# Patient Record
Sex: Female | Born: 1940 | Race: White | Hispanic: No | Marital: Married | Smoking: Former smoker
Health system: Southern US, Community
[De-identification: ages and names within clinical notes are randomized; demographics above are authoritative.]

## PROBLEM LIST (undated history)

## (undated) DIAGNOSIS — F419 Anxiety disorder, unspecified: Secondary | ICD-10-CM

## (undated) DIAGNOSIS — M2550 Pain in unspecified joint: Secondary | ICD-10-CM

## (undated) DIAGNOSIS — F329 Major depressive disorder, single episode, unspecified: Secondary | ICD-10-CM

## (undated) DIAGNOSIS — Z87442 Personal history of urinary calculi: Secondary | ICD-10-CM

## (undated) DIAGNOSIS — F32A Depression, unspecified: Secondary | ICD-10-CM

## (undated) DIAGNOSIS — G20 Parkinson's disease: Secondary | ICD-10-CM

## (undated) DIAGNOSIS — R0989 Other specified symptoms and signs involving the circulatory and respiratory systems: Secondary | ICD-10-CM

## (undated) DIAGNOSIS — F0390 Unspecified dementia without behavioral disturbance: Secondary | ICD-10-CM

## (undated) DIAGNOSIS — H269 Unspecified cataract: Secondary | ICD-10-CM

## (undated) DIAGNOSIS — Z86010 Personal history of colonic polyps: Secondary | ICD-10-CM

## (undated) DIAGNOSIS — M8580 Other specified disorders of bone density and structure, unspecified site: Secondary | ICD-10-CM

## (undated) DIAGNOSIS — G8929 Other chronic pain: Secondary | ICD-10-CM

## (undated) DIAGNOSIS — J189 Pneumonia, unspecified organism: Secondary | ICD-10-CM

## (undated) DIAGNOSIS — J339 Nasal polyp, unspecified: Secondary | ICD-10-CM

## (undated) DIAGNOSIS — E039 Hypothyroidism, unspecified: Secondary | ICD-10-CM

## (undated) DIAGNOSIS — G20A1 Parkinson's disease without dyskinesia, without mention of fluctuations: Secondary | ICD-10-CM

## (undated) DIAGNOSIS — R32 Unspecified urinary incontinence: Secondary | ICD-10-CM

## (undated) HISTORY — PX: ABDOMINAL HYSTERECTOMY: SHX81

## (undated) HISTORY — PX: TONSILLECTOMY: SUR1361

## (undated) HISTORY — DX: Other chronic pain: G89.29

## (undated) HISTORY — DX: Parkinson's disease: G20

## (undated) HISTORY — PX: CATARACT EXTRACTION: SUR2

## (undated) HISTORY — DX: Parkinson's disease without dyskinesia, without mention of fluctuations: G20.A1

## (undated) HISTORY — DX: Unspecified cataract: H26.9

## (undated) HISTORY — PX: APPENDECTOMY: SHX54

## (undated) HISTORY — DX: Pain in unspecified joint: M25.50

## (undated) HISTORY — DX: Hypothyroidism, unspecified: E03.9

## (undated) HISTORY — DX: Other specified disorders of bone density and structure, unspecified site: M85.80

## (undated) HISTORY — DX: Personal history of colonic polyps: Z86.010

## (undated) HISTORY — DX: Nasal polyp, unspecified: J33.9

---

## 2001-12-31 ENCOUNTER — Encounter: Admission: RE | Admit: 2001-12-31 | Discharge: 2001-12-31 | Payer: Self-pay | Admitting: Internal Medicine

## 2001-12-31 ENCOUNTER — Encounter: Payer: Self-pay | Admitting: Internal Medicine

## 2006-09-25 ENCOUNTER — Ambulatory Visit: Payer: Self-pay | Admitting: Family Medicine

## 2006-09-25 LAB — CONVERTED CEMR LAB
HCT: 37.5 % (ref 36.0–46.0)
Hemoglobin: 12.6 g/dL (ref 12.0–15.0)
Iron: 76 ug/dL (ref 42–145)
MCHC: 33.6 g/dL (ref 30.0–36.0)
MCV: 88.4 fL (ref 78.0–100.0)
Platelets: 265 10*3/uL (ref 150–400)
RBC: 4.24 M/uL (ref 3.87–5.11)
RDW: 12.1 % (ref 11.5–14.6)
TSH: 0.48 microintl units/mL (ref 0.35–5.50)
WBC: 6.4 10*3/uL (ref 4.5–10.5)

## 2007-04-16 ENCOUNTER — Encounter (INDEPENDENT_AMBULATORY_CARE_PROVIDER_SITE_OTHER): Payer: Self-pay | Admitting: Family Medicine

## 2007-06-20 ENCOUNTER — Telehealth (INDEPENDENT_AMBULATORY_CARE_PROVIDER_SITE_OTHER): Payer: Self-pay | Admitting: *Deleted

## 2007-08-01 ENCOUNTER — Telehealth (INDEPENDENT_AMBULATORY_CARE_PROVIDER_SITE_OTHER): Payer: Self-pay | Admitting: *Deleted

## 2008-09-06 ENCOUNTER — Encounter: Payer: Self-pay | Admitting: Family Medicine

## 2008-09-07 ENCOUNTER — Encounter (INDEPENDENT_AMBULATORY_CARE_PROVIDER_SITE_OTHER): Payer: Self-pay | Admitting: *Deleted

## 2008-09-07 LAB — CONVERTED CEMR LAB
ALT: 24 units/L
AST: 25 units/L
Albumin: 4 g/dL
Alkaline Phosphatase: 49 units/L
BUN: 23 mg/dL
CO2, serum: 28 mmol/L
Calcium: 9.6 mg/dL
Chloride, Serum: 105 mmol/L
Cholesterol: 195 mg/dL
Creatinine, Ser: 0.6 mg/dL
Glucose, Bld: 126 mg/dL
HDL: 40 mg/dL
LDL Cholesterol: 124.8 mg/dL
Potassium, serum: 3.8 mmol/L
Sodium, serum: 142 mmol/L
TSH: 0.69 microintl units/mL
Total Bilirubin: 0.2 mg/dL
Total Protein: 6.1 g/dL
Triglycerides: 153 mg/dL

## 2008-09-20 ENCOUNTER — Encounter: Payer: Self-pay | Admitting: Family Medicine

## 2008-10-11 ENCOUNTER — Encounter (INDEPENDENT_AMBULATORY_CARE_PROVIDER_SITE_OTHER): Payer: Self-pay | Admitting: *Deleted

## 2008-10-20 ENCOUNTER — Encounter: Payer: Self-pay | Admitting: Family Medicine

## 2008-10-20 ENCOUNTER — Ambulatory Visit: Payer: Self-pay | Admitting: Family Medicine

## 2008-10-20 DIAGNOSIS — M25519 Pain in unspecified shoulder: Secondary | ICD-10-CM | POA: Insufficient documentation

## 2008-10-20 DIAGNOSIS — H9319 Tinnitus, unspecified ear: Secondary | ICD-10-CM | POA: Insufficient documentation

## 2008-10-20 DIAGNOSIS — M2550 Pain in unspecified joint: Secondary | ICD-10-CM

## 2008-10-20 DIAGNOSIS — M79609 Pain in unspecified limb: Secondary | ICD-10-CM

## 2008-10-20 DIAGNOSIS — E039 Hypothyroidism, unspecified: Secondary | ICD-10-CM | POA: Insufficient documentation

## 2008-10-21 ENCOUNTER — Encounter (INDEPENDENT_AMBULATORY_CARE_PROVIDER_SITE_OTHER): Payer: Self-pay | Admitting: *Deleted

## 2008-10-25 ENCOUNTER — Encounter (INDEPENDENT_AMBULATORY_CARE_PROVIDER_SITE_OTHER): Payer: Self-pay | Admitting: *Deleted

## 2009-01-08 ENCOUNTER — Encounter: Payer: Self-pay | Admitting: Internal Medicine

## 2009-01-26 ENCOUNTER — Ambulatory Visit: Payer: Self-pay | Admitting: Family Medicine

## 2009-01-26 DIAGNOSIS — R209 Unspecified disturbances of skin sensation: Secondary | ICD-10-CM

## 2009-01-28 ENCOUNTER — Telehealth (INDEPENDENT_AMBULATORY_CARE_PROVIDER_SITE_OTHER): Payer: Self-pay | Admitting: *Deleted

## 2009-01-28 LAB — CONVERTED CEMR LAB
ALT: 54 units/L — ABNORMAL HIGH (ref 0–35)
AST: 34 units/L (ref 0–37)
Albumin: 4.2 g/dL (ref 3.5–5.2)
Alkaline Phosphatase: 51 units/L (ref 39–117)
BUN: 17 mg/dL (ref 6–23)
Basophils Absolute: 0 10*3/uL (ref 0.0–0.1)
Basophils Relative: 0.2 % (ref 0.0–3.0)
Bilirubin, Direct: 0 mg/dL (ref 0.0–0.3)
CO2: 29 meq/L (ref 19–32)
Calcium: 9.5 mg/dL (ref 8.4–10.5)
Chloride: 106 meq/L (ref 96–112)
Cholesterol: 190 mg/dL (ref 0–200)
Creatinine, Ser: 0.6 mg/dL (ref 0.4–1.2)
Eosinophils Absolute: 0.1 10*3/uL (ref 0.0–0.7)
Eosinophils Relative: 2.2 % (ref 0.0–5.0)
GFR calc non Af Amer: 105.52 mL/min (ref >60)
Glucose, Bld: 100 mg/dL — ABNORMAL HIGH (ref 70–99)
HCT: 38.7 % (ref 36.0–46.0)
HDL: 37.3 mg/dL — ABNORMAL LOW (ref >39.00)
Hemoglobin: 13.3 g/dL (ref 12.0–15.0)
LDL Cholesterol: 120 mg/dL — ABNORMAL HIGH (ref 0–99)
Lymphocytes Relative: 33 % (ref 12.0–46.0)
Lymphs Abs: 2 10*3/uL (ref 0.7–4.0)
MCHC: 34.5 g/dL (ref 30.0–36.0)
MCV: 87.6 fL (ref 78.0–100.0)
Monocytes Absolute: 0.5 10*3/uL (ref 0.1–1.0)
Monocytes Relative: 7.9 % (ref 3.0–12.0)
Neutro Abs: 3.5 10*3/uL (ref 1.4–7.7)
Neutrophils Relative %: 56.7 % (ref 43.0–77.0)
Platelets: 210 10*3/uL (ref 150.0–400.0)
Potassium: 3.9 meq/L (ref 3.5–5.1)
RBC: 4.42 M/uL (ref 3.87–5.11)
RDW: 12 % (ref 11.5–14.6)
Sodium: 140 meq/L (ref 135–145)
TSH: 2.03 microintl units/mL (ref 0.35–5.50)
Total Bilirubin: 0.6 mg/dL (ref 0.3–1.2)
Total CHOL/HDL Ratio: 5
Total Protein: 7.3 g/dL (ref 6.0–8.3)
Triglycerides: 164 mg/dL — ABNORMAL HIGH (ref 0.0–149.0)
VLDL: 32.8 mg/dL (ref 0.0–40.0)
WBC: 6.1 10*3/uL (ref 4.5–10.5)

## 2009-01-31 ENCOUNTER — Ambulatory Visit: Payer: Self-pay | Admitting: Family Medicine

## 2009-01-31 DIAGNOSIS — D485 Neoplasm of uncertain behavior of skin: Secondary | ICD-10-CM

## 2009-02-01 ENCOUNTER — Telehealth (INDEPENDENT_AMBULATORY_CARE_PROVIDER_SITE_OTHER): Payer: Self-pay | Admitting: *Deleted

## 2009-02-01 ENCOUNTER — Encounter: Payer: Self-pay | Admitting: Family Medicine

## 2009-02-09 ENCOUNTER — Ambulatory Visit: Payer: Self-pay | Admitting: Family Medicine

## 2009-02-09 DIAGNOSIS — K625 Hemorrhage of anus and rectum: Secondary | ICD-10-CM | POA: Insufficient documentation

## 2009-02-21 ENCOUNTER — Encounter (INDEPENDENT_AMBULATORY_CARE_PROVIDER_SITE_OTHER): Payer: Self-pay | Admitting: *Deleted

## 2009-02-21 ENCOUNTER — Ambulatory Visit: Payer: Self-pay | Admitting: Family Medicine

## 2009-02-21 LAB — CONVERTED CEMR LAB
OCCULT 1: NEGATIVE
OCCULT 2: NEGATIVE
OCCULT 3: NEGATIVE

## 2009-02-22 ENCOUNTER — Ambulatory Visit: Payer: Self-pay | Admitting: Family Medicine

## 2009-02-23 ENCOUNTER — Encounter (INDEPENDENT_AMBULATORY_CARE_PROVIDER_SITE_OTHER): Payer: Self-pay | Admitting: *Deleted

## 2009-02-23 LAB — CONVERTED CEMR LAB
ALT: 53 units/L — ABNORMAL HIGH (ref 0–35)
AST: 33 units/L (ref 0–37)
Albumin: 4.1 g/dL (ref 3.5–5.2)
Alkaline Phosphatase: 57 units/L (ref 39–117)
Bilirubin, Direct: 0.1 mg/dL (ref 0.0–0.3)
Total Bilirubin: 0.6 mg/dL (ref 0.3–1.2)
Total Protein: 7.1 g/dL (ref 6.0–8.3)

## 2009-05-11 ENCOUNTER — Telehealth (INDEPENDENT_AMBULATORY_CARE_PROVIDER_SITE_OTHER): Payer: Self-pay | Admitting: *Deleted

## 2009-05-25 ENCOUNTER — Ambulatory Visit: Payer: Self-pay | Admitting: Family Medicine

## 2009-05-25 DIAGNOSIS — J019 Acute sinusitis, unspecified: Secondary | ICD-10-CM | POA: Insufficient documentation

## 2009-06-23 ENCOUNTER — Ambulatory Visit: Payer: Self-pay | Admitting: Internal Medicine

## 2009-06-24 ENCOUNTER — Telehealth (INDEPENDENT_AMBULATORY_CARE_PROVIDER_SITE_OTHER): Payer: Self-pay | Admitting: *Deleted

## 2009-06-28 ENCOUNTER — Telehealth: Payer: Self-pay | Admitting: Family Medicine

## 2009-06-29 ENCOUNTER — Ambulatory Visit: Payer: Self-pay | Admitting: Internal Medicine

## 2009-07-11 ENCOUNTER — Ambulatory Visit: Payer: Self-pay | Admitting: Family Medicine

## 2009-08-09 ENCOUNTER — Telehealth (INDEPENDENT_AMBULATORY_CARE_PROVIDER_SITE_OTHER): Payer: Self-pay | Admitting: *Deleted

## 2009-09-12 ENCOUNTER — Telehealth (INDEPENDENT_AMBULATORY_CARE_PROVIDER_SITE_OTHER): Payer: Self-pay | Admitting: *Deleted

## 2009-10-12 ENCOUNTER — Telehealth (INDEPENDENT_AMBULATORY_CARE_PROVIDER_SITE_OTHER): Payer: Self-pay | Admitting: *Deleted

## 2009-12-12 ENCOUNTER — Telehealth (INDEPENDENT_AMBULATORY_CARE_PROVIDER_SITE_OTHER): Payer: Self-pay | Admitting: *Deleted

## 2010-02-13 ENCOUNTER — Telehealth (INDEPENDENT_AMBULATORY_CARE_PROVIDER_SITE_OTHER): Payer: Self-pay | Admitting: *Deleted

## 2010-03-07 ENCOUNTER — Ambulatory Visit: Payer: Self-pay | Admitting: Family Medicine

## 2010-03-07 DIAGNOSIS — H04129 Dry eye syndrome of unspecified lacrimal gland: Secondary | ICD-10-CM | POA: Insufficient documentation

## 2010-03-07 DIAGNOSIS — K117 Disturbances of salivary secretion: Secondary | ICD-10-CM | POA: Insufficient documentation

## 2010-03-08 ENCOUNTER — Encounter: Payer: Self-pay | Admitting: Family Medicine

## 2010-03-08 LAB — CONVERTED CEMR LAB
BUN: 12 mg/dL (ref 6–23)
Basophils Absolute: 0 10*3/uL (ref 0.0–0.1)
Basophils Relative: 0.6 % (ref 0.0–3.0)
CO2: 28 meq/L (ref 19–32)
Calcium: 9.9 mg/dL (ref 8.4–10.5)
Chloride: 105 meq/L (ref 96–112)
Creatinine, Ser: 0.7 mg/dL (ref 0.4–1.2)
Eosinophils Absolute: 0.1 10*3/uL (ref 0.0–0.7)
Eosinophils Relative: 1.7 % (ref 0.0–5.0)
GFR calc non Af Amer: 88.03 mL/min (ref >60)
Glucose, Bld: 96 mg/dL (ref 70–99)
HCT: 38.7 % (ref 36.0–46.0)
Hemoglobin: 13.4 g/dL (ref 12.0–15.0)
Lymphocytes Relative: 28.4 % (ref 12.0–46.0)
Lymphs Abs: 1.7 10*3/uL (ref 0.7–4.0)
MCHC: 34.7 g/dL (ref 30.0–36.0)
MCV: 85.9 fL (ref 78.0–100.0)
Monocytes Absolute: 0.5 10*3/uL (ref 0.1–1.0)
Monocytes Relative: 9.2 % (ref 3.0–12.0)
Neutro Abs: 3.6 10*3/uL (ref 1.4–7.7)
Neutrophils Relative %: 60.1 % (ref 43.0–77.0)
Platelets: 268 10*3/uL (ref 150.0–400.0)
Potassium: 4.1 meq/L (ref 3.5–5.1)
RBC: 4.5 M/uL (ref 3.87–5.11)
RDW: 13.8 % (ref 11.5–14.6)
Sed Rate: 31 mm/hr — ABNORMAL HIGH (ref 0–22)
Sodium: 139 meq/L (ref 135–145)
TSH: 1.99 microintl units/mL (ref 0.35–5.50)
WBC: 5.9 10*3/uL (ref 4.5–10.5)

## 2010-03-10 ENCOUNTER — Telehealth (INDEPENDENT_AMBULATORY_CARE_PROVIDER_SITE_OTHER): Payer: Self-pay | Admitting: *Deleted

## 2010-03-10 LAB — CONVERTED CEMR LAB: Anti Nuclear Antibody(ANA): NEGATIVE

## 2010-03-31 ENCOUNTER — Encounter: Payer: Self-pay | Admitting: Family Medicine

## 2010-04-04 ENCOUNTER — Encounter (INDEPENDENT_AMBULATORY_CARE_PROVIDER_SITE_OTHER): Payer: Self-pay | Admitting: *Deleted

## 2010-04-07 ENCOUNTER — Ambulatory Visit: Payer: Self-pay | Admitting: Family Medicine

## 2010-04-07 DIAGNOSIS — G20 Parkinson's disease: Secondary | ICD-10-CM

## 2010-04-07 DIAGNOSIS — Z780 Asymptomatic menopausal state: Secondary | ICD-10-CM | POA: Insufficient documentation

## 2010-04-19 ENCOUNTER — Telehealth: Payer: Self-pay | Admitting: Family Medicine

## 2010-04-27 ENCOUNTER — Ambulatory Visit: Payer: Self-pay | Admitting: Family Medicine

## 2010-04-27 LAB — CONVERTED CEMR LAB
OCCULT 1: NEGATIVE
OCCULT 2: NEGATIVE
OCCULT 3: NEGATIVE

## 2010-04-28 ENCOUNTER — Encounter (INDEPENDENT_AMBULATORY_CARE_PROVIDER_SITE_OTHER): Payer: Self-pay | Admitting: *Deleted

## 2010-04-28 ENCOUNTER — Encounter: Payer: Self-pay | Admitting: Family Medicine

## 2010-05-05 ENCOUNTER — Ambulatory Visit: Payer: Self-pay | Admitting: Family Medicine

## 2010-05-09 ENCOUNTER — Telehealth (INDEPENDENT_AMBULATORY_CARE_PROVIDER_SITE_OTHER): Payer: Self-pay | Admitting: *Deleted

## 2010-05-09 LAB — CONVERTED CEMR LAB
ALT: 24 units/L (ref 0–35)
Bilirubin, Direct: 0.1 mg/dL (ref 0.0–0.3)
Cholesterol: 177 mg/dL (ref 0–200)
Direct LDL: 110.4 mg/dL
HDL: 40.8 mg/dL (ref >39.00)
Total Bilirubin: 0.5 mg/dL (ref 0.3–1.2)
Total CHOL/HDL Ratio: 4
Triglycerides: 250 mg/dL — ABNORMAL HIGH (ref 0.0–149.0)

## 2010-05-16 ENCOUNTER — Telehealth (INDEPENDENT_AMBULATORY_CARE_PROVIDER_SITE_OTHER): Payer: Self-pay | Admitting: *Deleted

## 2010-05-17 ENCOUNTER — Telehealth (INDEPENDENT_AMBULATORY_CARE_PROVIDER_SITE_OTHER): Payer: Self-pay | Admitting: *Deleted

## 2010-05-31 ENCOUNTER — Encounter (INDEPENDENT_AMBULATORY_CARE_PROVIDER_SITE_OTHER): Payer: Self-pay | Admitting: *Deleted

## 2010-05-31 DIAGNOSIS — M949 Disorder of cartilage, unspecified: Secondary | ICD-10-CM

## 2010-05-31 DIAGNOSIS — M899 Disorder of bone, unspecified: Secondary | ICD-10-CM | POA: Insufficient documentation

## 2010-06-02 ENCOUNTER — Ambulatory Visit: Payer: Self-pay | Admitting: Family Medicine

## 2010-07-03 ENCOUNTER — Telehealth (INDEPENDENT_AMBULATORY_CARE_PROVIDER_SITE_OTHER): Payer: Self-pay | Admitting: *Deleted

## 2010-07-05 ENCOUNTER — Telehealth (INDEPENDENT_AMBULATORY_CARE_PROVIDER_SITE_OTHER): Payer: Self-pay | Admitting: *Deleted

## 2010-07-26 ENCOUNTER — Telehealth (INDEPENDENT_AMBULATORY_CARE_PROVIDER_SITE_OTHER): Payer: Self-pay | Admitting: *Deleted

## 2010-11-14 ENCOUNTER — Telehealth (INDEPENDENT_AMBULATORY_CARE_PROVIDER_SITE_OTHER): Payer: Self-pay | Admitting: *Deleted

## 2010-12-12 NOTE — Progress Notes (Signed)
Summary: bone density results  Phone Note Outgoing Call   Call placed by: Doristine Devoid,  May 17, 2010 2:18 PM Call placed to: Patient Summary of Call: spoke w/ patient aware of bone density results informed she needs to be started on fosamax 35mg  weekly and to be sure not to take medication at same time as synthroid patient ok'd instruction    New/Updated Medications: FOSAMAX 35 MG TABS (ALENDRONATE SODIUM) take one tablet weekly Prescriptions: FOSAMAX 35 MG TABS (ALENDRONATE SODIUM) take one tablet weekly  #4 x 12   Entered by:   Doristine Devoid   Authorized by:   Neena Rhymes MD   Signed by:   Doristine Devoid on 05/17/2010   Method used:   Electronically to        [facility] [address] #[id number]* (retail)       [address]       [place], [place]  [address]       Ph: [phone]       Fax: [phone]   RxID:   [id number]    Preventive Care Screening  T-score L femur:    Date:  04/28/2010    Results:  -1.3   T-score L hip:    Date:  04/28/2010    Results:  1.1 SDs  T-score L-Spine:    Date:  04/28/2010    Results:  1.123   Bone Density:    Date:  04/28/2010    Results:  osteopenia std dev

## 2010-12-12 NOTE — Progress Notes (Signed)
Summary: Spot on face  Phone Note Call from Patient Call back at Home Phone [phone]   Caller: Patient Reason for Call: Talk to Nurse, Talk to Doctor Summary of Call: Patient called and left a message on the triage line stating that she had a freckle/brown spot on her face. It had gotten a "crust" on it with black marks on it. She said the crust has fallen off but the mark feels thicker or raised now. She wants to know if she should be seen by Dr. Beverely Low or referred to a dermatologist. Her friend see Amy Swaziland at [facility]. Please advise.  Initial call taken by: Harold Barban,  May 16, 2010 10:36 AM  Follow-up for Phone Call        ok for derm- Dr Swaziland may be a long wait.  up to pt how long she wants to wait Follow-up by: Neena Rhymes MD,  May 16, 2010 10:43 AM  Additional Follow-up for Phone Call Additional follow up Details #1::        Patient has an appt on with Dr. Swaziland on 10.19.11 @ 9:30. Patient is aware of appt information.  Additional Follow-up by: Harold Barban,  May 16, 2010 2:54 PM

## 2010-12-12 NOTE — Progress Notes (Signed)
Summary: refill  Phone Note Refill Request Message from:  Fax from Pharmacy on July 26, 2010 11:46 AM  Refills Requested: Medication #1:  FENOFIBRATE 160 MG TABS take one tablet daily [facility] [address] - fax [phone]  Initial call taken by: Okey Regal Spring,  July 26, 2010 11:46 AM  Follow-up for Phone Call        spoke with pharmacy can disregard request rx on file from 07-05-10 ............Marland KitchenFelecia Deloach CMA  July 26, 2010 11:51 AM

## 2010-12-12 NOTE — Progress Notes (Signed)
Summary: celebrex refill   Phone Note Refill Request Message from:  Fax from Pharmacy on [facility] [address] fax [phone]  Refills Requested: Medication #1:  CELEBREX 200 MG  CAPS Take one tablet twice daily as needed Initial call taken by: Barb Merino,  December 12, 2009 10:00 AM    Prescriptions: CELEBREX 200 MG  CAPS (CELECOXIB) Take one tablet twice daily as needed  #60 x 2   Entered by:   Doristine Devoid   Authorized by:   Neena Rhymes MD   Signed by:   Doristine Devoid on 12/12/2009   Method used:   Electronically to        [facility] #[id number]* (retail)       [address]       [place], [place]  [address]       Ph: [phone]       Fax: [phone]   RxID:   [id number]

## 2010-12-12 NOTE — Progress Notes (Signed)
Summary: levothyroxine refill   Phone Note Refill Request Message from:  Fax from Pharmacy on February 13, 2010 9:16 AM  Refills Requested: Medication #1:  LEVOTHYROXINE SODIUM 112 MCG  TABS take 1 daily [facility] [address] Valinda Hoar [phone]   Method Requested: Fax to Local Pharmacy Next Appointment Scheduled: NO APPT Initial call taken by: Barb Merino,  February 13, 2010 9:17 AM    Prescriptions: LEVOTHYROXINE SODIUM 112 MCG  TABS (LEVOTHYROXINE SODIUM) take 1 daily  #30 x 0   Entered by:   Doristine Devoid   Authorized by:   Neena Rhymes MD   Signed by:   Doristine Devoid on 02/13/2010   Method used:   Electronically to        [facility] [address] #[id number]* (retail)       [address]       [place], [place]  [address]       Ph: [phone]       Fax: [phone]   RxID:   [id number]

## 2010-12-12 NOTE — Progress Notes (Signed)
Summary: levothyroxine refill   Phone Note Refill Request Message from:  Fax from Pharmacy on July 03, 2010 8:13 AM  Refills Requested: Medication #1:  LEVOTHYROXINE SODIUM 112 MCG  TABS take 1 daily [facility] [address] - fax [phone]  Initial call taken by: Okey Regal Spring,  July 03, 2010 8:23 AM    Prescriptions: LEVOTHYROXINE SODIUM 112 MCG  TABS (LEVOTHYROXINE SODIUM) take 1 daily  #30 x 3   Entered by:   Doristine Devoid CMA   Authorized by:   Neena Rhymes MD   Signed by:   Doristine Devoid CMA on 07/03/2010   Method used:   Electronically to        [facility] #[id number]* (retail)       [address]       [place], [place]  [address]       Ph: [phone]       Fax: [phone]   RxID:   [id number]

## 2010-12-12 NOTE — Miscellaneous (Signed)
  Clinical Lists Changes  Observations: Added new observation of MAMMOGRAM: normal (03/31/2010 13:54)      Preventive Care Screening  Mammogram:    Date:  03/31/2010    Results:  normal

## 2010-12-12 NOTE — Assessment & Plan Note (Signed)
Summary: cpx/cbs   Vital Signs:  Patient profile:   70 year old female Height:      63.50 inches Weight:      150 pounds Pulse rate:   66 / minute BP sitting:   114 / 70  (left arm)  Vitals Entered By: Doristine Devoid (Apr 07, 2010 10:00 AM) CC: CPX    History of Present Illness: 70 yo woman here today for CPE.    Here for Medicare AWV:  1.   Risk factors based on Past M, S, F history: - age: pt due for colonoscopy but not willing to have one.  needs bone density. - mildly elevated cholesterol and triglycerides: due for labs, not fasting today - tremor: R hand, new for pt.  thinks it's 'funny' 2.   Physical Activities: golf, gardening, walking 3.   Depression/mood: none present- good social support 4.   Hearing: only deficits are when pt has sinus congestion 5.   ADL's: independent 6.   Fall Risk: not at risk 7.   Home Safety: safe at home, no concerns 8.   Height, weight, &visual acuity: see vitals 9.   Counseling: anticipatory guidance provided. 10.   Labs ordered based on risk factors: pt to return for fasting labs 11.           Referral Coordination: had mammogram 5/20, bone density was ordered but not done.  not willing to have colonoscopy. 12.           Care Plan- proceed w/ DEXA, check labs. 13.           Cognitive Assessment- AAOx3, W-O-R-L-D, 3/3 recall  Preventive Screening-Counseling & Management  Alcohol-Tobacco     Alcohol drinks/day: 1     Smoking Status: quit  Caffeine-Diet-Exercise     Does Patient Exercise: yes      Sexual History:  currently monogamous.        Drug Use:  never.    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 Codeine 2)  ! * Ivp Dye 3)  ! Neurontin  Past History:  Past Medical History: Last updated: 10/20/2008 Chronic joint pain Hypothyroidism  Family History: Last updated: 10/20/2008 CAD-no HTN-no DM-no STROKE-no COLON CA-no BREAST CA-no  Social History: Last updated: 05/25/2009 Married originally from [place]  Past Surgical History: Hysterectomy- ovaries remain Tonsillectomy Appendectomy  Social History: Smoking Status:  quit Does Patient Exercise:  yes Sexual History:  currently monogamous Drug Use:  never  Review of Systems  The patient denies anorexia, fever, weight loss, weight gain, vision loss, decreased hearing, hoarseness, chest pain, syncope, dyspnea on exertion, peripheral edema, prolonged cough, headaches, abdominal pain, melena, hematochezia, severe indigestion/heartburn, hematuria, suspicious skin lesions, depression, abnormal bleeding, enlarged lymph nodes, and breast masses.    Physical Exam  General:  in no acute distress; alert,appropriate and cooperative throughout examination Head:  Normocephalic and atraumatic without obvious abnormalities. No apparent alopecia or balding.   Eyes:  No corneal or conjunctival inflammation noted. EOMI. Perrla. Vision grossly normal. Ears:  External ear exam shows no significant lesions or deformities.  Otoscopic examination reveals clear canals, tympanic membranes are intact bilaterally without bulging, retraction, inflammation or discharge. Hearing is grossly normal bilaterally. Nose:  External nasal examination shows no deformity or inflammation. Nasal mucosa are pink and moist without lesions or exudates. Mouth:  Oral mucosa and oropharynx  without lesions or exudates.  Teeth in good repair. Neck:  No deformities, masses, or tenderness noted. Breasts:  No mass, nodules, thickening, tenderness, bulging, retraction, inflamation, nipple discharge or skin changes noted.   Lungs:  Normal respiratory effort, chest expands symmetrically. Lungs are clear to auscultation, no crackles or wheezes. Heart:  Normal rate and regular rhythm. S1 and S2 normal without gallop, murmur, click, rub or other extra sounds. Abdomen:  Bowel sounds positive,abdomen soft and non-tender without masses, organomegaly or hernias noted. Genitalia:  normal introitus, no external lesions, no vaginal discharge, and mucosa pink and moist.  normal adnexa on bimanual exam Msk:  No deformity or scoliosis noted of thoracic or lumbar spine.   Pulses:  +2 carotid, radial, DP Extremities:  No clubbing, cyanosis, edema, or deformity noted with normal full range of motion of all joints.   Neurologic:  mild tremor of R hand.  no cogwheeling or rigidity remainder of neuro exam WNL Skin:  Intact without suspicious lesions or rashes Cervical Nodes:  No lymphadenopathy noted Axillary Nodes:  No palpable lymphadenopathy Psych:  Cognition and judgment appear intact. Alert and cooperative with normal attention span and concentration. No apparent delusions, illusions, hallucinations   Impression & Recommendations:  Problem # 1:  PHYSICAL EXAMINATION (ICD-V70.0)  PE WNL.  pt unwilling to have colonoscopy, will do stool cards as a compromise.  UTD on mammogram.  needs baseline EKG in order to have one on file for future comparison.  Orders: EKG w/ Interpretation (93000) First annual wellness visit with prevention plan  (Z6109)  Problem # 2:  POSTMENOPAUSAL STATUS (ICD-V49.81) Assessment: New due for bone density.  needs to schedule at her convenience.  will also check Vit D  Problem # 3:  DRY EYE SYNDROME (ICD-375.15) Assessment: Unchanged somewhat improved w/ Pataday drops.   encouraged to schedule routine eye exam and discuss this w/ her eye doctor.  Problem # 4:  TREMOR, RIGHT HAND (ICD-781.0) Assessment: New pt unable to report whether this occurs only at rest or also w/ activity.  'i don't pay much attention to it'.  thinks that it occurs when her arm is resting on something.  no rigidity or cogwheeling on exam.  asked pt to monitor sxs and f/u if worsening or changing.  Complete Medication List: 1)  Levothyroxine Sodium 112 Mcg Tabs (Levothyroxine sodium) .... Take 1 daily 2)  Celebrex 200 Mg Caps (Celecoxib) .... Take one tablet twice daily as needed 3)  Tylenol Extra Strength 500 Mg Tabs (Acetaminophen) .... Take 2-4 tablet daily 4)  Voltaren 1 % Gel (Diclofenac sodium) .... Apply 2 grams qid as needed 5)  Claritin 10 Mg Tabs (Loratadine) .... Prn 6)  Pataday 0.2 % Soln (Olopatadine hcl) .Marland Kitchen.. 1 drop in each eye daily  Patient Instructions: 1)  Please schedule a fasting lab appt at your convenience 2)  Hepatic Panel prior to visit ICD-9: V70 3)  Lipid panel prior to visit ICD-9 : V70 4)  Vitamin D prior to visit  ICD-9: V49.81 5)  Please call and schedule your bone density at your convenience 6)  Get an eye exam at your convenience- they will also be able to help with your dry eye 7)  If your tremor is worsening, you notice it in other areas, or have other concerns- please call 8)  Complete your stool cards and return them 9)  Call with any questions or concerns

## 2010-12-12 NOTE — Progress Notes (Signed)
Summary: labs  Phone Note Outgoing Call   Call placed by: Doristine Devoid,  May 09, 2010 10:08 AM Call placed to: Patient Summary of Call: triglycerides are almost 100 points higher than last check.  needs to start fenofibrate 160mg  daily and recheck LFTs in 6-8 weeks.  should start 50,000 units weekly x8 weeks and then recheck  Follow-up for Phone Call        spoke w/ patient aware of labs and that medication needs to be started........Marland KitchenDoristine Devoid  May 09, 2010 10:17 AM     New/Updated Medications: FENOFIBRATE 160 MG TABS (FENOFIBRATE) take one tablet daily VITAMIN D (ERGOCALCIFEROL) 50000 UNIT CAPS (ERGOCALCIFEROL) take one tablet weekly x8 weeks Prescriptions: VITAMIN D (ERGOCALCIFEROL) 50000 UNIT CAPS (ERGOCALCIFEROL) take one tablet weekly x8 weeks  #8 x 0   Entered by:   Doristine Devoid   Authorized by:   Neena Rhymes MD   Signed by:   Doristine Devoid on 05/09/2010   Method used:   Electronically to        [facility] #[id number]* (retail)       [address]       [place], [place]  [address]       Ph: [phone]       Fax: [phone]   RxID:   [id number] FENOFIBRATE 160 MG TABS (FENOFIBRATE) take one tablet daily  #30 x 3   Entered by:   Doristine Devoid   Authorized by:   Neena Rhymes MD   Signed by:   Doristine Devoid on 05/09/2010   Method used:   Electronically to        [facility] #[facility]* (retail)       [address]       [place], [place]  [address]       Ph: [phone]       Fax: [phone]   RxID:   [id number]

## 2010-12-12 NOTE — Miscellaneous (Signed)
  Clinical Lists Changes  Problems: Added new problem of OSTEOPENIA (ICD-733.90) 

## 2010-12-12 NOTE — Assessment & Plan Note (Signed)
Summary: TO DISCUSS HER BONE DESTINY ANOTHER TEST--PH   Vital Signs:  Patient profile:   70 year old female Height:      63.50 inches (161.29 cm) Weight:      151.25 pounds (68.75 kg) BMI:     26.47 Temp:     98.6 degrees F (37.00 degrees C) oral BP sitting:   120 / 60  (right arm)  Vitals Entered By: Lucious Groves CMA (June 02, 2010 10:32 AM) CC: Est pt--discuss bone density and medication concern with Fosamax./kb Comments Patient also notes that her sinus issues returned last night and she would like to discuss that also./kb   History of Present Illness: 70 yo woman here today for  1) Bone density- has questions about Fosamax, 'i'm afraid of it'.  fears side effects listed in insert- jaw pain, GERD, femur fx, etc.  brought copy of DEXA to review.  pt w/ osteopenia  2) Sinus pressure- sxs started yesterday.  had bloody nasal drainage, facial pressure/pain, + tooth pain.  + HA, temp last night to 100.  still feeling poorly today.  hx of sinus infxns.  3) Tremor- pt reports it is unchanged, she 'laughs about it'.  husband now more aware of it.  will consider w/u after she returns from an upcoming vacation.  Current Medications (verified): 1)  Levothyroxine Sodium 112 Mcg  Tabs (Levothyroxine Sodium) .... Take 1 Daily 2)  Celebrex 200 Mg  Caps (Celecoxib) .... Take One Tablet Twice Daily As Needed 3)  Tylenol Extra Strength 500 Mg Tabs (Acetaminophen) .... Take 2-4 Tablet Daily 4)  Voltaren 1 % Gel (Diclofenac Sodium) .... Apply 2 Grams Qid As Needed 5)  Claritin 10 Mg Tabs (Loratadine) .... Prn 6)  Fenofibrate 160 Mg Tabs (Fenofibrate) .... Take One Tablet Daily 7)  Vitamin D (Ergocalciferol) 50000 Unit Caps (Ergocalciferol) .... Take One Tablet Weekly X8 Weeks 8)  Amoxicillin 500 Mg Tabs (Amoxicillin) .... 2 Tabs By Mouth Two Times A Day X10 Days 9)  Diflucan 150 Mg Tabs (Fluconazole) .... Once Daily.  May Repeat in 3 Days If Sxs Persist  Allergies (verified): 1)  ! Codeine 2)  !  * Ivp Dye 3)  ! Neurontin  Past History:  Past Medical History: Chronic joint pain Hypothyroidism osteopenia  Review of Systems      See HPI  Physical Exam  General:  in no acute distress; alert,appropriate and cooperative throughout examination Head:  Normocephalic and atraumatic without obvious abnormalities. No apparent alopecia or balding.  + TTP over maxillary sinuses Eyes:  no injxn or inflammation Ears:  External ear exam shows no significant lesions or deformities.  Otoscopic examination reveals clear canals, tympanic membranes are intact bilaterally without bulging, retraction, inflammation or discharge. Hearing is grossly normal bilaterally. Nose:  External nasal examination shows no deformity or inflammation. Nasal mucosa are pink and moist without lesions or exudates. Mouth:  Oral mucosa and oropharynx without lesions or exudates.  Teeth in good repair. Lungs:  Normal respiratory effort, chest expands symmetrically. Lungs are clear to auscultation, no crackles or wheezes. Heart:  Normal rate and regular rhythm. S1 and S2 normal without gallop, murmur, click, rub or other extra sounds. Neurologic:  + pill rolling tremor of R hand   Impression & Recommendations:  Problem # 1:  OSTEOPENIA (ICD-733.90) Assessment Unchanged given pt's concerns about the side effects of meds will stop medication.  encouraged increased Ca + Vit D along w/ weight bearing exercise.  will need to follow bone density  closely.  Pt expresses understanding and is in agreement w/ this plan. The following medications were removed from the medication list:    Fosamax 35 Mg Tabs (Alendronate sodium) .Marland Kitchen... Take one tablet weekly Her updated medication list for this problem includes:    Vitamin D (ergocalciferol) 50000 Unit Caps (Ergocalciferol) .Marland Kitchen... Take one tablet weekly x8 weeks  Problem # 2:  SINUSITIS - ACUTE-NOS (ICD-461.9) Assessment: Unchanged  pt w/ recurrent sinusitis.  start amox.  Pt  expresses understanding and is in agreement w/ this plan. Her updated medication list for this problem includes:    Amoxicillin 500 Mg Tabs (Amoxicillin) .Marland Kitchen... 2 tabs by mouth two times a day x10 days  Orders: Prescription Created Electronically [phone])  Problem # 3:  TREMOR, RIGHT HAND (ICD-781.0) Assessment: Unchanged pt's tremor now appears more pill rolling in nature.  again offered neuro c/s- pt declined.  will follow.  Complete Medication List: 1)  Levothyroxine Sodium 112 Mcg Tabs (Levothyroxine sodium) .... Take 1 daily 2)  Celebrex 200 Mg Caps (Celecoxib) .... Take one tablet twice daily as needed 3)  Tylenol Extra Strength 500 Mg Tabs (Acetaminophen) .... Take 2-4 tablet daily 4)  Voltaren 1 % Gel (Diclofenac sodium) .... Apply 2 grams qid as needed 5)  Claritin 10 Mg Tabs (Loratadine) .... Prn 6)  Fenofibrate 160 Mg Tabs (Fenofibrate) .... Take one tablet daily 7)  Vitamin D (ergocalciferol) 50000 Unit Caps (Ergocalciferol) .... Take one tablet weekly x8 weeks 8)  Amoxicillin 500 Mg Tabs (Amoxicillin) .... 2 tabs by mouth two times a day x10 days 9)  Diflucan 150 Mg Tabs (Fluconazole) .... Once daily.  may repeat in 3 days if sxs persist  Patient Instructions: 1)  Follow up in November (6 months from your physical) unless you need me sooner 2)  Take the Amoxicillin as directed for your sinus infxn 3)  Use the Diflucan as needed 4)  STOP the Fosamax 5)  Start the Caltrate + D (2 daily) once you finish you weekly Vit D 6)  If you decide you want to see a neurologist for your tremor- call when you get back 7)  Have an AMAZING trip! Prescriptions: DIFLUCAN 150 MG TABS (FLUCONAZOLE) once daily.  may repeat in 3 days if sxs persist  #2 x 0   Entered and Authorized by:   Neena Rhymes MD   Signed by:   Neena Rhymes MD on 06/02/2010   Method used:   Electronically to        [facility] [phone]* (retail)       [address]       [place], [place]  [address]       Ph: [phone]       Fax: [phone]   RxID:   [id number] AMOXICILLIN 500 MG TABS (AMOXICILLIN) 2 tabs by mouth two times a day x10 days  #40 x 0   Entered and Authorized by:   Neena Rhymes MD   Signed by:   Neena Rhymes MD on 06/02/2010   Method used:   Electronically to        [facility] #[address]* (retail)       [address]       [place], [place]  [address]       Ph: [phone]       Fax: [phone]   RxID:  [id number]  

## 2010-12-12 NOTE — Progress Notes (Signed)
Summary: dry mouth  Phone Note Call from Patient Call back at Home Phone [phone]   Caller: Patient Summary of Call: informed pt of labwork =normal Pt wants to know why does she still have such a dry mouth? Mouth so dry cannot lick a envelope. --Pataday drops is helping with my eyes .Kandice Hams  March 10, 2010 4:27 PM  Initial call taken by: Kandice Hams,  March 10, 2010 4:27 PM  Follow-up for Phone Call        based on pt's med list she isn't on anything that would cause dry mouth.  if she is on herbal meds or supplements they may be causing symptoms.  pt may want to discuss this with her dentist- they may have additional thoughts. Follow-up by: Neena Rhymes MD,  March 10, 2010 4:36 PM  Additional Follow-up for Phone Call Additional follow up Details #1::        Spoke with pt  given Dr Beverely Low recommendations, pt agred will check with dentist .Kandice Hams  March 10, 2010 4:47 PM  Additional Follow-up by: Kandice Hams,  March 10, 2010 4:47 PM

## 2010-12-12 NOTE — Progress Notes (Signed)
Summary: FENOFIBRATE REFILL   Phone Note Refill Request Call back at [phone] Message from:  Pharmacy on July 05, 2010 9:50 AM  Refills Requested: Medication #1:  FENOFIBRATE 160 MG TABS take one tablet daily   Dosage confirmed as above?Dosage Confirmed   Supply Requested: 3 months   Notes: THE PATIENT IS REQUESTING AUTHORIZATION TO DISPENSE A 90 DAY SUPPLY. [facility] [address]   Next Appointment Scheduled: NONE Initial call taken by: Lavell Islam,  July 05, 2010 9:50 AM    Prescriptions: FENOFIBRATE 160 MG TABS (FENOFIBRATE) take one tablet daily  #90 x 1   Entered by:   Doristine Devoid CMA   Authorized by:   Neena Rhymes MD   Signed by:   Doristine Devoid CMA on December 28, 202011   Method used:   Electronically to        [facility] [address] #[facility]* (retail)       [address]       [place], [place]  [address]       Ph: [phone]       Fax: [phone]   RxID:   [id number]

## 2010-12-12 NOTE — Progress Notes (Signed)
Summary: FYI CELEBREX AND STOOL CARD  Phone Note Call from Patient Call back at [facility] Phone [phone]   Summary of Call: pt called in ref to her stool card kit, she is ready to do it, and was reading to avoid anti-inflammatory meds for 7 days.  Pt is on Celebrex and cannot go without it. she will hurt she remembers before when doing kit. Dr Beverely Low told me not to worry about it. Informed pt if was told before, ok to continue Celebrex.  Initial call taken by: Kandice Hams,  April 19, 2010 2:06 PM  Follow-up for Phone Call        pt can complete cards and continue her Celebrex. Follow-up by: Neena Rhymes MD,  April 19, 2010 2:09 PM

## 2010-12-12 NOTE — Letter (Signed)
Summary: Results Follow up Letter  [facility]  [address] [place], [place] [address]   Phone: [phone]  Fax: [phone]    04/28/2010 MRN: [id number]  Melissa Douglas [address] [place], [place]  [address]  Dear [name],  The following are the results of your recent test(s):  Test         Result    Pap Smear:        Normal _____  Not Normal _____ Comments: ______________________________________________________ Cholesterol: LDL(Bad cholesterol):         Your goal is less than:         HDL (Good cholesterol):       Your goal is more than: Comments:  ______________________________________________________ Mammogram:        Normal _____  Not Normal _____ Comments:  ___________________________________________________________________ Hemoccult:        Normal __X___  Not normal _______ Comments:    _____________________________________________________________________ Other Tests:    We routinely do not discuss normal results over the telephone.  If you desire a copy of the results, or you have any questions about this information we can discuss them at your next office visit.   Sincerely,

## 2010-12-12 NOTE — Assessment & Plan Note (Signed)
Summary: dry mouth & eyes/cbs   Vital Signs:  Patient profile:   70 year old female Height:      63.50 inches Weight:      152 pounds BMI:     26.60 Pulse rate:   64 / minute BP sitting:   120 / 60  Vitals Entered By: Kandice Hams (March 07, 2010 9:29 AM) CC: C/O DRY MOUTH, EYES DRY, PUFFY   History of Present Illness: 70 yo woman here today c/o dry mouth and eyes.  first noticed eye sxs 3 weeks ago- having problems w/ contacts.  unable to tolerate wearing contacts at this point.  reports eyelids now swollen and 'has a goo' that comes down over her eyes, especially in the AMs.  also reports having dry mouth- inability to lick an envelope.  has been trying to stay hydrated w/ water, club soda.  also notes vaginal dryness.  needing to drink liquids in order to swallow foods.  pt reports sxs were very rapid.  possible subjective fevers.  notes 'white gunk' on back of tongue.  is using Claritin but no decongestants.  no autoimmune diseases in the family.  Allergies (verified): 1)  ! Codeine 2)  ! * Ivp Dye 3)  ! Neurontin  Past History:  Past Medical History: Last updated: 10/20/2008 Chronic joint pain Hypothyroidism  Review of Systems      See HPI  Physical Exam  General:  in no acute distress; alert,appropriate and cooperative throughout examination Head:  Normocephalic and atraumatic without obvious abnormalities. No apparent alopecia or balding.   Eyes:  No corneal or conjunctival inflammation noted. EOMI. Perrla.Vision grossly normal.  superior lids minimally puffy Mouth:  + PND, tacky mucous membranes Neck:  No deformities, masses, or tenderness noted. Lungs:  Normal respiratory effort, chest expands symmetrically. Lungs are clear to auscultation, no crackles or wheezes. Heart:  Normal rate and regular rhythm. S1 and S2 normal without gallop, murmur, click, rub or other extra sounds.   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  may be strictly allergy related given itching, lid puffiness, and morning crusting and 'goo'.  start Pataday and monitor for improvement.  check labs to r/o autoimmune process.  Orders: Prescription Created Electronically [phone])  Problem # 2:  DRY MOUTH (ICD-527.7) Assessment: New see above.  will attempt to r/o autoimmune process w/ labs.  advised pt to avoid anticholinergic meds and stay well hydrated.  pt in agreement. Orders: Venipuncture (53664) TLB-CBC Platelet - w/Differential (85025-CBCD) TLB-BMP (Basic Metabolic Panel-BMET) (80048-METABOL) TLB-Sedimentation Rate (ESR) (85652-ESR) T-Antinuclear Antib (ANA) [phone]) T- * Misc. Laboratory test [phone])  Problem # 3:  HYPOTHYROIDISM (ICD-244.9) Assessment: Unchanged needs med refill but is overdue for labs.  will check today.  inadequate replacement tx may also be contributing to pt's dryness. Her updated medication list for this problem includes:    Levothyroxine Sodium 112 Mcg Tabs (Levothyroxine sodium) .Marland Kitchen... Take 1 daily  Orders: TLB-TSH (Thyroid Stimulating Hormone) (84443-TSH)  Complete Medication List: 1)  Levothyroxine Sodium 112 Mcg Tabs (Levothyroxine sodium) .... Take 1 daily 2)  Celebrex 200 Mg Caps (Celecoxib) .... Take one tablet twice daily as needed 3)  Tylenol Extra Strength 500 Mg Tabs (Acetaminophen) .... Take 2-4 tablet daily 4)  Voltaren 1 % Gel (Diclofenac sodium) .... Apply 2 grams qid as needed 5)  Claritin 10 Mg Tabs (Loratadine) .... Prn 6)  Pataday 0.2 %  Soln (Olopatadine hcl) .Marland Kitchen.. 1 drop in each eye daily  Patient Instructions: 1)  Follow up in 2-3 weeks to recheck your symptoms 2)  Use the Pataday- 1 drop in each eye daily 3)  Continue the Claritin daily 4)  Drink plenty of fluids 5)  We'll notify you of your lab results 6)  Call with any questions or concerns 7)  Hang in  there!!! Prescriptions: LEVOTHYROXINE SODIUM 112 MCG  TABS (LEVOTHYROXINE SODIUM) take 1 daily  #30 x 3   Entered and Authorized by:   Neena Rhymes MD   Signed by:   Neena Rhymes MD on 03/07/2010   Method used:   Electronically to        [facility] [id number]* (retail)       [address]       [place], [place]  [address]       Ph: [phone]       Fax: [phone]   RxID:   [id number] PATADAY 0.2 % SOLN (OLOPATADINE HCL) 1 drop in each eye daily  #1 x 3   Entered and Authorized by:   Neena Rhymes MD   Signed by:   Neena Rhymes MD on 03/07/2010   Method used:   Electronically to        [facility] #[id number]* (retail)       [address]       [place], [place]  [address]       Ph: [phone]       Fax: [phone]   RxID:   [id number]

## 2010-12-14 NOTE — Progress Notes (Signed)
Summary: REFILL  Phone Note Refill Request Call back at [phone] Message from:  Pharmacy on November 14, 2010 7:55 AM  Refills Requested: Medication #1:  LEVOTHYROXINE SODIUM 112 MCG  TABS take 1 daily   Dosage confirmed as above?Dosage Confirmed   Supply Requested: 1 month [facility] [address] [address]  Next Appointment Scheduled: NONE Initial call taken by: Lavell Islam,  November 14, 2010 7:55 AM    Prescriptions: LEVOTHYROXINE SODIUM 112 MCG  TABS (LEVOTHYROXINE SODIUM) take 1 daily  #30 x 3   Entered by:   Doristine Devoid CMA   Authorized by:   Neena Rhymes MD   Signed by:   Doristine Devoid CMA on 11/14/2010   Method used:   Electronically to        [id number]* (retail)       [address]       [place], [place]  [address]       Ph: [phone]       Fax: [phone]   RxID:   [id number]

## 2011-02-20 ENCOUNTER — Other Ambulatory Visit: Payer: Self-pay | Admitting: *Deleted

## 2011-02-20 MED ORDER — FENOFIBRATE 160 MG PO TABS: 160.0000 mg | ORAL_TABLET | Freq: Every day | ORAL | Status: DC

## 2011-02-20 NOTE — Telephone Encounter (Signed)
Ok for refill- should have fasting labs to assess cholesterol and trigs

## 2011-02-20 NOTE — Telephone Encounter (Signed)
Last Ov- 06/02/10, last filled 07/05/10 90 x 1.

## 2011-03-05 ENCOUNTER — Encounter: Payer: Self-pay | Admitting: Family Medicine

## 2011-03-05 ENCOUNTER — Telehealth: Payer: Self-pay | Admitting: Family Medicine

## 2011-03-05 NOTE — Telephone Encounter (Signed)
Patient said pharmacist told her that she needed to make lab appointment, then see Dr Beverely Low when she picked up her latest prescription----I have given her a lab appointment for 4/26 and an appt for Dr Beverely Low for "followup and discuss labs"  on 5/1 at 9am---does Dr Beverely Low want her to have an separate lab appointment??    If so, what orders and codes do I use??   thanks

## 2011-03-05 NOTE — Telephone Encounter (Signed)
She needs to see Dr. Beverely Low then she will sent pt to lab this is usually her process no need for separate appointments.

## 2011-03-07 ENCOUNTER — Telehealth: Payer: Self-pay | Admitting: *Deleted

## 2011-03-07 NOTE — Telephone Encounter (Signed)
Pt sent a letter to Dr. Beverely Low about some meds she has been taking along with stool issues and stool habits. Pt also noted needing new center for mammogram due to [facility] changing locations.   Per Dr. Beverely Low the pt was advised:  Dairy products are very constipating. If having this much trouble, can stop calcium supplement and switch to plain vitamin d supplement (1000 units daily). Closest mammo center in [place] is the [facility] on the corner of [address] (just down the street from [place]) or she could also go to [facility]. Pt notes that this is too far for her and she will request referral to [facility] at upcoming ov.

## 2011-03-07 NOTE — Telephone Encounter (Signed)
Spoke to patient---cancelled labs for 4/26, explained that patient will see Dr Beverely Low, then go to lab on 5/1

## 2011-03-08 ENCOUNTER — Other Ambulatory Visit: Payer: Self-pay

## 2011-03-13 ENCOUNTER — Ambulatory Visit (INDEPENDENT_AMBULATORY_CARE_PROVIDER_SITE_OTHER): Payer: Medicare Other | Admitting: Family Medicine

## 2011-03-13 DIAGNOSIS — E559 Vitamin D deficiency, unspecified: Secondary | ICD-10-CM | POA: Insufficient documentation

## 2011-03-13 DIAGNOSIS — R030 Elevated blood-pressure reading, without diagnosis of hypertension: Secondary | ICD-10-CM

## 2011-03-13 DIAGNOSIS — E781 Pure hyperglyceridemia: Secondary | ICD-10-CM

## 2011-03-13 DIAGNOSIS — E039 Hypothyroidism, unspecified: Secondary | ICD-10-CM

## 2011-03-13 DIAGNOSIS — N951 Menopausal and female climacteric states: Secondary | ICD-10-CM | POA: Insufficient documentation

## 2011-03-13 DIAGNOSIS — R5383 Other fatigue: Secondary | ICD-10-CM | POA: Insufficient documentation

## 2011-03-13 LAB — BASIC METABOLIC PANEL
CO2: 26 mEq/L (ref 19–32)
Calcium: 9.8 mg/dL (ref 8.4–10.5)
Creatinine, Ser: 0.7 mg/dL (ref 0.4–1.2)

## 2011-03-13 LAB — CBC WITH DIFFERENTIAL/PLATELET
Basophils Absolute: 0 10*3/uL (ref 0.0–0.1)
Eosinophils Absolute: 0.1 10*3/uL (ref 0.0–0.7)
Lymphocytes Relative: 27.5 % (ref 12.0–46.0)
MCHC: 33.4 g/dL (ref 30.0–36.0)
Neutrophils Relative %: 60.9 % (ref 43.0–77.0)
Platelets: 246 10*3/uL (ref 150.0–400.0)
RDW: 13.5 % (ref 11.5–14.6)

## 2011-03-13 LAB — HEPATIC FUNCTION PANEL
Albumin: 4.3 g/dL (ref 3.5–5.2)
Alkaline Phosphatase: 39 U/L (ref 39–117)
Bilirubin, Direct: 0.1 mg/dL (ref 0.0–0.3)
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
## 2011-03-13 NOTE — Assessment & Plan Note (Signed)
Discussed hormone replacement tx.  Pt would like time to think about this and will get back to me at upcoming CPE.

## 2011-03-13 NOTE — Assessment & Plan Note (Signed)
Will need labs rechecked at upcoming CPE.  Attempted to order today but was told that insurance will only allow 1 test per calendar year.
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
## 2011-03-13 NOTE — Patient Instructions (Signed)
Please schedule your complete physical in July- do not eat before this appt We'll notify you of your lab results and adjust your meds as needed Consider the vaginal estrogen for your dryness Call with any questions or concerns Have a great trip!!

## 2011-03-13 NOTE — Assessment & Plan Note (Signed)
Started fenofibrate but never returned for LFTs.  Will draw today.

## 2011-03-13 NOTE — Assessment & Plan Note (Signed)
Pt reports she has had sxs for >1 yr but she feels this may be progressing.  Will check labs to r/o thyroid involvement and adjust meds prn.

## 2011-03-13 NOTE — Assessment & Plan Note (Signed)
Given fatigue will check TSH to make sure she doesn't need med adjustment.  Will follow.

## 2011-03-14 ENCOUNTER — Encounter: Payer: Self-pay | Admitting: *Deleted

## 2011-03-14 ENCOUNTER — Other Ambulatory Visit: Payer: Self-pay | Admitting: *Deleted

## 2011-03-14 MED ORDER — LEVOTHYROXINE SODIUM 112 MCG PO TABS: 112.0000 ug | ORAL_TABLET | Freq: Every day | ORAL | Status: DC

## 2011-05-15 ENCOUNTER — Other Ambulatory Visit: Payer: Self-pay | Admitting: Family Medicine

## 2011-05-15 MED ORDER — FENOFIBRATE 160 MG PO TABS: 160.0000 mg | ORAL_TABLET | Freq: Every day | ORAL | Status: DC

## 2011-05-15 NOTE — Telephone Encounter (Signed)
Pt has cpx scheduled later this week. Sent refill.

## 2011-05-18 ENCOUNTER — Ambulatory Visit (INDEPENDENT_AMBULATORY_CARE_PROVIDER_SITE_OTHER): Payer: Medicare Other | Admitting: Family Medicine

## 2011-05-18 ENCOUNTER — Encounter: Payer: Self-pay | Admitting: Family Medicine

## 2011-05-18 DIAGNOSIS — E559 Vitamin D deficiency, unspecified: Secondary | ICD-10-CM

## 2011-05-18 DIAGNOSIS — Z0000 Encounter for general adult medical examination without abnormal findings: Secondary | ICD-10-CM | POA: Insufficient documentation

## 2011-05-18 DIAGNOSIS — E781 Pure hyperglyceridemia: Secondary | ICD-10-CM

## 2011-05-18 DIAGNOSIS — E039 Hypothyroidism, unspecified: Secondary | ICD-10-CM

## 2011-05-18 LAB — HEPATIC FUNCTION PANEL
ALT: 20 U/L (ref 0–35)
AST: 20 U/L (ref 0–37)
Alkaline Phosphatase: 39 U/L (ref 39–117)
Bilirubin, Direct: 0.1 mg/dL (ref 0.0–0.3)
Total Bilirubin: 0.4 mg/dL (ref 0.3–1.2)

## 2011-05-18 LAB — BASIC METABOLIC PANEL
Chloride: 107 mEq/L (ref 96–112)
Potassium: 4 mEq/L (ref 3.5–5.1)

## 2011-05-18 LAB — LIPID PANEL
LDL Cholesterol: 99 mg/dL (ref 0–99)
VLDL: 13.8 mg/dL (ref 0.0–40.0)

## 2011-05-18 LAB — CBC WITH DIFFERENTIAL/PLATELET
Basophils Absolute: 0 10*3/uL (ref 0.0–0.1)
Basophils Relative: 0.6 % (ref 0.0–3.0)
Eosinophils Absolute: 0.1 10*3/uL (ref 0.0–0.7)
Lymphocytes Relative: 28.7 % (ref 12.0–46.0)
MCHC: 33.9 g/dL (ref 30.0–36.0)
Neutrophils Relative %: 61.6 % (ref 43.0–77.0)
RBC: 4.29 Mil/uL (ref 3.87–5.11)
RDW: 13.3 % (ref 11.5–14.6)

## 2011-05-18 NOTE — Assessment & Plan Note (Signed)
Check labs.  Adjust meds prn  

## 2011-05-18 NOTE — Patient Instructions (Signed)
Follow up in 6 months to recheck blood pressure and cholesterol Your exam looks great!  Keep up the good work! We'll notify you of your lab results Schedule an appt w/ the audiologist when you feel it's necessary If you decide you want to see neuro for your tremor- call me Call with any questions or concerns Have a great trip!

## 2011-05-18 NOTE — Assessment & Plan Note (Signed)
Pt's PE WNL w/ exception of RUE tremor and slight rigidity.  Pt reports this is unchanged and she does not wish to pursue w/u at this time.  Will continue to follow.  Plans on scheduling mammo next month, refuses colonoscopy- iFOB given.  Check labs.  Anticipatory guidance provided.

## 2011-05-18 NOTE — Assessment & Plan Note (Signed)
Check labs.  Replete as needed.

## 2011-05-18 NOTE — Assessment & Plan Note (Signed)
Tolerating fenofibrate w/out difficulty.  Check labs.  Adjust meds prn.

## 2011-05-18 NOTE — Progress Notes (Signed)
  Subjective:    Patient ID: Melissa Douglas, female    DOB: 06-Jun-1941, 70 y.o.   MRN: [id number]  HPI Here today for CPE.  Risk Factors: Hypothyroid- chronic problem for pt, on synthroid.  Currently asymptomatic. Elevated trigs- chronic problem for pt, on fenofibrate.  Tolerating med w/out difficulty. Physical Activity: active, golfs regularly Fall Risk: low risk Depression: no current sxs Hearing: some hearing loss- neighbor is an Biomedical scientist ADL's: indepedent Cognitive: normal linear thought process, memory intact, no deficits noted Home Safety: safe at home, lives w/ husband Height, Weight, BMI, Visual Acuity: see vitals, vision corrected to 20/20 w/ glasses Counseling: refusing colonoscopy, overdue on mammo- plans on scheduling once travel schedule allows. Labs Ordered: See A&P Care Plan: See A&P    Review of Systems Patient reports no vision/ hearing changes, adenopathy,fever, weight change,  persistant/recurrent hoarseness , swallowing issues, chest pain, palpitations, edema, persistant/recurrent cough, hemoptysis, dyspnea (rest/exertional/paroxysmal nocturnal), gastrointestinal bleeding (melena, rectal bleeding), abdominal pain, significant heartburn, bowel changes, GU symptoms (dysuria, hematuria, incontinence), Gyn symptoms (abnormal  bleeding, pain),  syncope, focal weakness, memory loss, numbness & tingling, skin/hair/nail changes, abnormal bruising or bleeding, anxiety, or depression.     Objective:   Physical Exam  General Appearance:    Alert, cooperative, no distress, appears stated age  Head:    Normocephalic, without obvious abnormality, atraumatic  Eyes:    PERRL, conjunctiva/corneas clear, EOM's intact, fundi    benign, both eyes  Ears:    Normal TM's and external ear canals, both ears  Nose:   Nares normal, septum midline, mucosa normal, no drainage    or sinus tenderness  Throat:   Lips, mucosa, and tongue normal; teeth and gums normal  Neck:   Supple,  symmetrical, trachea midline, no adenopathy;    Thyroid: no enlargement/tenderness/nodules  Back:     Symmetric, no curvature, ROM normal, no CVA tenderness  Lungs:     Clear to auscultation bilaterally, respirations unlabored  Chest Wall:    No tenderness or deformity   Heart:    Regular rate and rhythm, S1 and S2 normal, no murmur, rub   or gallop  Breast Exam:    No tenderness, masses, or nipple abnormality  Abdomen:     Soft, non-tender, bowel sounds active all four quadrants,    no masses, no organomegaly  Genitalia:    Deferred  Rectal:    Deferred  Extremities:   Extremities normal, atraumatic, no cyanosis or edema  Pulses:   2+ and symmetric all extremities  Skin:   Skin color, texture, turgor normal, no rashes or lesions  Lymph nodes:   Cervical, supraclavicular, and axillary nodes normal  Neurologic:   CNII-XII intact, normal strength, sensation and reflexes    throughout.  Pill rolling tremor on R w/ some rigidity of RUE          Assessment & Plan:

## 2011-05-20 LAB — VITAMIN D 1,25 DIHYDROXY
Vitamin D 1, 25 (OH)2 Total: 93 pg/mL — ABNORMAL HIGH (ref 18–72)
Vitamin D3 1, 25 (OH)2: 93 pg/mL

## 2011-05-21 ENCOUNTER — Encounter: Payer: Self-pay | Admitting: *Deleted

## 2011-06-04 ENCOUNTER — Other Ambulatory Visit: Payer: Medicare Other

## 2011-06-04 ENCOUNTER — Encounter: Payer: Self-pay | Admitting: *Deleted

## 2011-06-04 ENCOUNTER — Other Ambulatory Visit: Payer: Self-pay | Admitting: Family Medicine

## 2011-06-04 DIAGNOSIS — Z1211 Encounter for screening for malignant neoplasm of colon: Secondary | ICD-10-CM

## 2011-07-11 ENCOUNTER — Telehealth: Payer: Self-pay | Admitting: *Deleted

## 2011-07-11 NOTE — Telephone Encounter (Signed)
Pt aware- Referral put in--

## 2011-07-11 NOTE — Telephone Encounter (Signed)
Pt is requesting a referral to see Dr Geralyn Corwin for hearing aid. [phone]. Please advise

## 2011-07-11 NOTE — Telephone Encounter (Signed)
Ok for referral- if insurance doesn't require it, she can call and schedule appt herself.

## 2011-08-10 ENCOUNTER — Other Ambulatory Visit: Payer: Self-pay | Admitting: Family Medicine

## 2011-08-10 MED ORDER — FENOFIBRATE 160 MG PO TABS: 160.0000 mg | ORAL_TABLET | Freq: Every day | ORAL | Status: DC

## 2011-08-10 NOTE — Telephone Encounter (Signed)
Done

## 2011-11-07 ENCOUNTER — Other Ambulatory Visit: Payer: Self-pay | Admitting: Family Medicine

## 2011-11-07 MED ORDER — FENOFIBRATE 160 MG PO TABS: 160.0000 mg | ORAL_TABLET | Freq: Every day | ORAL | Status: DC

## 2011-11-07 NOTE — Telephone Encounter (Signed)
rx sent to pharmacy by e-script  

## 2012-02-11 ENCOUNTER — Other Ambulatory Visit: Payer: Self-pay | Admitting: Family Medicine

## 2012-02-11 NOTE — Telephone Encounter (Signed)
Refill for  Fenofibrate 160MG  Tablets Qty 90 Take 1-tablet by mouth every day  Last filled 12.26.12

## 2012-02-12 MED ORDER — FENOFIBRATE 160 MG PO TABS: 160.0000 mg | ORAL_TABLET | Freq: Every day | ORAL | Status: DC

## 2012-02-12 NOTE — Telephone Encounter (Signed)
rx sent to pharmacy by e-script Letter has been mailed to pt address noted in the chart to advise they are overdue for cpe/ov/labs and the pt needs to contact office to set up appt   

## 2012-03-05 NOTE — Telephone Encounter (Signed)
Made patient an appointment per this note & the letter she received. She states is so very hard for her to go without food & going without it til 8am is going to be extremely hard. She wants to know if she can have a cracker at anytime prior to her appointment? Please advise Patient ph#

## 2012-03-05 NOTE — Telephone Encounter (Signed)
Spoke to patient and she understood & was very happy about it

## 2012-03-10 ENCOUNTER — Other Ambulatory Visit: Payer: Self-pay | Admitting: Family Medicine

## 2012-03-10 MED ORDER — LEVOTHYROXINE SODIUM 112 MCG PO TABS: 112.0000 ug | ORAL_TABLET | Freq: Every day | ORAL | Status: DC

## 2012-03-10 NOTE — Telephone Encounter (Signed)
rx sent to pharmacy by e-script for #[phone] with no refills to last pt til CPE on 04-18-12

## 2012-03-10 NOTE — Telephone Encounter (Signed)
refill Levothyroxine 0.112MG  Tabs Qty 30 Take 1-tablet by mouth every day last filled 2.28.13  Last OV 7.23.12 Future CPE scheduled for 6.7.13

## 2012-04-18 ENCOUNTER — Encounter: Payer: Self-pay | Admitting: Family Medicine

## 2012-04-18 ENCOUNTER — Encounter: Payer: Self-pay | Admitting: *Deleted

## 2012-04-18 ENCOUNTER — Ambulatory Visit (INDEPENDENT_AMBULATORY_CARE_PROVIDER_SITE_OTHER): Payer: Medicare Other | Admitting: Family Medicine

## 2012-04-18 VITALS — BP 132/80 | HR 67 | Temp 98.8°F | Ht 63.0 in | Wt 159.4 lb

## 2012-04-18 DIAGNOSIS — E781 Pure hyperglyceridemia: Secondary | ICD-10-CM

## 2012-04-18 DIAGNOSIS — N951 Menopausal and female climacteric states: Secondary | ICD-10-CM | POA: Diagnosis not present

## 2012-04-18 DIAGNOSIS — J302 Other seasonal allergic rhinitis: Secondary | ICD-10-CM | POA: Insufficient documentation

## 2012-04-18 DIAGNOSIS — J309 Allergic rhinitis, unspecified: Secondary | ICD-10-CM | POA: Diagnosis not present

## 2012-04-18 DIAGNOSIS — E559 Vitamin D deficiency, unspecified: Secondary | ICD-10-CM | POA: Diagnosis not present

## 2012-04-18 DIAGNOSIS — Z0000 Encounter for general adult medical examination without abnormal findings: Secondary | ICD-10-CM

## 2012-04-18 DIAGNOSIS — R259 Unspecified abnormal involuntary movements: Secondary | ICD-10-CM

## 2012-04-18 DIAGNOSIS — Z1231 Encounter for screening mammogram for malignant neoplasm of breast: Secondary | ICD-10-CM

## 2012-04-18 LAB — HEPATIC FUNCTION PANEL
AST: 19 U/L (ref 0–37)
Albumin: 4.4 g/dL (ref 3.5–5.2)
Alkaline Phosphatase: 39 U/L (ref 39–117)
Bilirubin, Direct: 0 mg/dL (ref 0.0–0.3)
Total Protein: 7 g/dL (ref 6.0–8.3)

## 2012-04-18 LAB — CBC WITH DIFFERENTIAL/PLATELET
Basophils Absolute: 0 10*3/uL (ref 0.0–0.1)
Eosinophils Relative: 2.2 % (ref 0.0–5.0)
Lymphocytes Relative: 32 % (ref 12.0–46.0)
Lymphs Abs: 1.8 10*3/uL (ref 0.7–4.0)
Monocytes Relative: 8.9 % (ref 3.0–12.0)
Neutrophils Relative %: 56.3 % (ref 43.0–77.0)
Platelets: 250 10*3/uL (ref 150.0–400.0)
RDW: 14 % (ref 11.5–14.6)
WBC: 5.8 10*3/uL (ref 4.5–10.5)

## 2012-04-18 LAB — BASIC METABOLIC PANEL
CO2: 27 mEq/L (ref 19–32)
Calcium: 9.6 mg/dL (ref 8.4–10.5)
Glucose, Bld: 95 mg/dL (ref 70–99)
Potassium: 4 mEq/L (ref 3.5–5.1)
Sodium: 141 mEq/L (ref 135–145)

## 2012-04-18 LAB — LIPID PANEL
HDL: 51.4 mg/dL (ref >39.00)
LDL Cholesterol: 96 mg/dL (ref 0–99)
Total CHOL/HDL Ratio: 3
Triglycerides: 79 mg/dL (ref 0.0–149.0)

## 2012-04-18 NOTE — Patient Instructions (Addendum)
Follow up in 6 months to recheck triglycerides We'll call you with your mammo appt We'll notify you of your lab results and make any changes if needed Call with any questions or concerns Keep up the good work- you look great! Have a great summer!!!

## 2012-04-18 NOTE — Progress Notes (Signed)
  Subjective:    Patient ID: Melissa Douglas, female    DOB: [birth year], 71 y.o.   MRN: [id number]  HPI Here today for CPE.  Risk Factors: Elevated trigs- chronic problem, on fenofibrate daily.  Denies abd pain, N/V, myalgais.  Hypothyroid- chronic problem, on Synthroid.  Due for labs.  Denies fatigue, heat/cold intolerance, denies dry/brittle hair or nails.  Seasonal allergic rhinitis- 'i have been miserable'.  Typically taking Loratadine will take the Claritin D as needed.  Having occasional nose bleeds.  + nasal congestion, itchy/watery eyes.  Has hx of nose bleeds.   Vaginal dryness- previously using astroglide but this stopped working.  Started using OTC Replenz w/ good results.  Physical Activity: very active Fall Risk: low, steady on feet Depression: denies current sxs Hearing: normal to conversational tones, decreased to whispered voice ADL's: independent Cognitive: normal linear thought process, memory and attention intact Home Safety: safe at homw Height, Weight, BMI, Visual Acuity: see vitals, vision corrected to 20/20 w/ glasses Counseling:  Due for mammo, refusing colonoscopy, refusing DEXA. Labs Ordered: See A&P Care Plan: See A&P    Review of Systems For ROS see HPI     Objective:   Physical Exam  General Appearance:    Alert, cooperative, no distress, appears stated age  Head:    Normocephalic, without obvious abnormality, atraumatic  Eyes:    PERRL, conjunctiva/corneas clear, EOM's intact, fundi    benign, both eyes  Ears:    Normal TM's and external ear canals, both ears  Nose:   Nares normal, septum midline, mucosa normal, no drainage    or sinus tenderness  Throat:   Lips, mucosa, and tongue normal; teeth and gums normal  Neck:   Supple, symmetrical, trachea midline, no adenopathy;    Thyroid: no enlargement/tenderness/nodules  Back:     Symmetric, no curvature, ROM normal, no CVA tenderness  Lungs:     Clear to auscultation bilaterally, respirations  unlabored  Chest Wall:    No tenderness or deformity   Heart:    Regular rate and rhythm, S1 and S2 normal, no murmur, rub   or gallop  Breast Exam:    Deferred  Abdomen:     Soft, non-tender, bowel sounds active all four quadrants,    no masses, no organomegaly  Genitalia:    deferred  Rectal:    Extremities:   Extremities normal, atraumatic, no cyanosis or edema  Pulses:   2+ and symmetric all extremities  Skin:   Skin color, texture, turgor normal, no rashes or lesions  Lymph nodes:   Cervical, supraclavicular, and axillary nodes normal  Neurologic:   CNII-XII intact, normal strength, sensation and reflexes    Throughout, pill rolling tremor of R hand          Assessment & Plan:

## 2012-04-21 LAB — VITAMIN D 1,25 DIHYDROXY
Vitamin D 1, 25 (OH)2 Total: 104 pg/mL — ABNORMAL HIGH (ref 18–72)
Vitamin D2 1, 25 (OH)2: 11 pg/mL

## 2012-04-23 ENCOUNTER — Telehealth: Payer: Self-pay | Admitting: Family Medicine

## 2012-04-23 MED ORDER — LEVOTHYROXINE SODIUM 112 MCG PO TABS: 112.0000 ug | ORAL_TABLET | Freq: Every day | ORAL | Status: DC

## 2012-04-23 NOTE — Telephone Encounter (Signed)
rx sent to pharmacy by e-script  

## 2012-04-23 NOTE — Telephone Encounter (Signed)
Refill: Levothyroxine 0.112mg  tabs. Take 1 tablet by mouth daily. Qty 45. Last fill 04-09-12

## 2012-04-29 ENCOUNTER — Ambulatory Visit (HOSPITAL_BASED_OUTPATIENT_CLINIC_OR_DEPARTMENT_OTHER)
Admission: RE | Admit: 2012-04-29 | Discharge: 2012-04-29 | Disposition: A | Discharge status: Home / Self Care | Payer: Medicare Other | Source: Ambulatory Visit | Attending: Family Medicine | Admitting: Family Medicine

## 2012-04-29 DIAGNOSIS — Z1231 Encounter for screening mammogram for malignant neoplasm of breast: Secondary | ICD-10-CM | POA: Diagnosis not present

## 2012-05-04 NOTE — Assessment & Plan Note (Signed)
Chronic problem.  Taking fenofibrate w/out difficulty.  Check labs.  Adjust meds prn

## 2012-05-04 NOTE — Assessment & Plan Note (Signed)
Unchanged.  Pt still not interested in neuro w/u- 'it's not bothering me or getting in my way'.  Tremor w/ parkinsonian features.  Will follow.

## 2012-05-04 NOTE — Assessment & Plan Note (Signed)
Chronic problem.  Check labs.  Replete PRN.

## 2012-05-04 NOTE — Assessment & Plan Note (Signed)
Physical done but too early to code for insurance reasons.

## 2012-05-04 NOTE — Assessment & Plan Note (Signed)
Chronic problem.  astroglide was no longer working for pt.  Now using OTC Replenz w/ good relief.

## 2012-05-04 NOTE — Assessment & Plan Note (Signed)
Deteriorated.  Pt taking OTC antihistamines w/out much relief.  Not interested in nasal steroid due to nose bleeds.  Encouraged netti pot if able to tolerate to rinse allergens.  Will follow.

## 2012-05-19 ENCOUNTER — Telehealth: Payer: Self-pay | Admitting: Family Medicine

## 2012-05-19 ENCOUNTER — Ambulatory Visit: Payer: Medicare Other | Admitting: Family Medicine

## 2012-05-19 MED ORDER — FENOFIBRATE 160 MG PO TABS: 160.0000 mg | ORAL_TABLET | Freq: Every day | ORAL | Status: DC

## 2012-05-19 NOTE — Telephone Encounter (Signed)
Refill done.  

## 2012-05-19 NOTE — Telephone Encounter (Signed)
Refill: Fenofibrate 160 mg tablets. Take 1 tablet by mouth once daily. Qty 90. Last fill 02-12-12

## 2012-06-03 DIAGNOSIS — H2510 Age-related nuclear cataract, unspecified eye: Secondary | ICD-10-CM | POA: Diagnosis not present

## 2012-07-22 ENCOUNTER — Encounter: Payer: Self-pay | Admitting: Family Medicine

## 2012-07-22 ENCOUNTER — Ambulatory Visit (INDEPENDENT_AMBULATORY_CARE_PROVIDER_SITE_OTHER): Payer: Medicare Other | Admitting: Family Medicine

## 2012-07-22 VITALS — BP 128/82 | HR 66 | Temp 98.4°F | Ht 63.0 in | Wt 165.2 lb

## 2012-07-22 DIAGNOSIS — R259 Unspecified abnormal involuntary movements: Secondary | ICD-10-CM | POA: Diagnosis not present

## 2012-07-22 DIAGNOSIS — R040 Epistaxis: Secondary | ICD-10-CM | POA: Diagnosis not present

## 2012-07-22 DIAGNOSIS — J019 Acute sinusitis, unspecified: Secondary | ICD-10-CM | POA: Insufficient documentation

## 2012-07-22 MED ORDER — AMOXICILLIN 875 MG PO TABS: 875.0000 mg | ORAL_TABLET | Freq: Two times a day (BID) | ORAL | Status: AC

## 2012-07-22 NOTE — Assessment & Plan Note (Signed)
New.  Start abx.  Reviewed supportive care and red flags that should prompt return.  Pt expressed understanding and is in agreement w/ plan.  

## 2012-07-22 NOTE — Assessment & Plan Note (Signed)
New.  Suspect this is due to overly dry sinuses.  Encouraged use of humidifier at night and nasal vasoline to keep tissues moist.  Reviewed appropriate epistaxis care.  Pt expressed understanding and is in agreement w/ plan.

## 2012-07-22 NOTE — Patient Instructions (Addendum)
We'll call you with your neurology appt Start the Amoxicillin twice daily for the sinus infection Continue the Loratadine daily for the allergies Line the nostrils w/ vasoline to prevent drying Use a humidifier at night In case of a nose bleed- apply ice to the bridge of the nose and use Afrin Call with any questions or concerns Hang in there!

## 2012-07-22 NOTE — Progress Notes (Signed)
  Subjective:    Patient ID: Melissa Douglas, female    DOB: 09-05-1941, 71 y.o.   MRN: [id number]  HPI Bloody nose- pt reports hx of similar, at that time was related to decongestant use.  Current sxs started last week.  Has occurred 2-3x, always overnight or 1st thing in the AM.  'it's a lot of blood'.  Has recently had construction done on the house and has had severe allergies recently.  Not currently using ASA.  Has been taking 'plain old Claritin' and avoiding the decongestants.  Bleeding from L nostril >R.  Bleeding will stop w/ pressure and ice.  + facial pain, pressure.  Ear fullness  Tremor- pt has had R resting tremor for 'years' but admits that it is now more bothersome than previous.  Finally willing to see neuro.   Review of Systems For ROS see HPI     Objective:   Physical Exam  Vitals reviewed. Constitutional: She appears well-developed and well-nourished. No distress.  HENT:  Head: Normocephalic and atraumatic.  Right Ear: Tympanic membrane normal.  Left Ear: Tympanic membrane normal.  Nose: Mucosal edema and rhinorrhea present. No epistaxis (no current bleeding but small ulcer seen on medial wall of L nare). Right sinus exhibits maxillary sinus tenderness and frontal sinus tenderness. Left sinus exhibits maxillary sinus tenderness and frontal sinus tenderness.  Mouth/Throat: Uvula is midline and mucous membranes are normal. Posterior oropharyngeal erythema present. No oropharyngeal exudate.  Eyes: Conjunctivae and EOM are normal. Pupils are equal, round, and reactive to light.  Neck: Normal range of motion. Neck supple.  Cardiovascular: Normal rate, regular rhythm and normal heart sounds.   Pulmonary/Chest: Effort normal and breath sounds normal. No respiratory distress. She has no wheezes.  Lymphadenopathy:    She has no cervical adenopathy.  Neurological:       Resting, pill-rolling tremor of R hand          Assessment & Plan:

## 2012-07-22 NOTE — Assessment & Plan Note (Signed)
Deteriorated.  Pt most likely w/ Parkinson's disease.  Now willing to see neuro about this.  Referral made.

## 2012-07-23 ENCOUNTER — Ambulatory Visit: Payer: Medicare Other | Admitting: Family Medicine

## 2012-08-11 ENCOUNTER — Telehealth: Payer: Self-pay | Admitting: Family Medicine

## 2012-08-11 MED ORDER — FENOFIBRATE 160 MG PO TABS: 160.0000 mg | ORAL_TABLET | Freq: Every day | ORAL | Status: DC

## 2012-08-11 NOTE — Telephone Encounter (Signed)
refill Fenofibrate (Tab) fenofibrate 160 MG Take 1 tablet (160 mg total) by mouth daily. #90 last fill 7.21.13 last ov 9.10.13 acute

## 2012-08-11 NOTE — Telephone Encounter (Signed)
Refill done.  

## 2012-08-12 DIAGNOSIS — E039 Hypothyroidism, unspecified: Secondary | ICD-10-CM | POA: Diagnosis not present

## 2012-08-12 DIAGNOSIS — G20 Parkinson's disease: Secondary | ICD-10-CM | POA: Diagnosis not present

## 2012-08-19 DIAGNOSIS — G20 Parkinson's disease: Secondary | ICD-10-CM | POA: Diagnosis not present

## 2012-08-28 ENCOUNTER — Other Ambulatory Visit: Payer: Self-pay | Admitting: Family Medicine

## 2012-08-28 NOTE — Telephone Encounter (Signed)
Left detailed message on [facility] voicemail to use refill that should be on file from 08/11/12 #90 x no refills and to call if any questions.

## 2012-08-28 NOTE — Telephone Encounter (Signed)
refill fenofibrate 160mg  tablets #90 take one tablet by mouth daily last fill 9.30.13--last ov 9.10.13-acute

## 2012-09-02 ENCOUNTER — Telehealth: Payer: Self-pay

## 2012-09-02 DIAGNOSIS — R040 Epistaxis: Secondary | ICD-10-CM

## 2012-09-02 NOTE — Telephone Encounter (Signed)
Not sure what the question is.  Is she currently having symptoms or problems?  Neuro will treat the Parkinson's.

## 2012-09-02 NOTE — Telephone Encounter (Signed)
I called pt back to get clarity.Pt states still having nose bleeds, last night trying to use bathroom heard a pop cut light on and blood every where. Pt asked should she take a flu shot?Pt states pain in back around shoulder blades, shortness of breath when walking up steps. Pt was just making you aware that she was dx with Parkison disease. Pt was also letting you know she finished antibiotics but didn't know if there was something else she is suppose to take or do. Plz advise      MW

## 2012-09-02 NOTE — Telephone Encounter (Signed)
If still having nose bleeds will need ENT referral SOB will need OV here b/c this is new for her Yes, I am aware of the Parkinson's diagnosis and I am here to assist in any way possible Yes, she should have flu shot.

## 2012-09-02 NOTE — Telephone Encounter (Signed)
Pt states came in for chronic nose bleeds due to straining cause of constipation. Pt states finished antibiotic what should she do now? Pt states you put in referral for neuro they Dx pt with Parkinson disease.  Plz advise        MW

## 2012-09-02 NOTE — Telephone Encounter (Signed)
Pt advised the SOB is coupled with the shoulder pain and has been on and off for a few weeks, scheduled pt for apt tomorrow 9:45am and advised if she has any worsening sxs to go to the ED, pt agreed and will go if needed, pt also agreed to ENT and that someone will call her with the apt date and time when available, pt will get flu shot at OV tomorrow, placed referral in chart for ENT,   MD Beverely Low made aware verbally

## 2012-09-03 ENCOUNTER — Ambulatory Visit (INDEPENDENT_AMBULATORY_CARE_PROVIDER_SITE_OTHER): Payer: Medicare Other | Admitting: Family Medicine

## 2012-09-03 ENCOUNTER — Encounter: Payer: Self-pay | Admitting: Family Medicine

## 2012-09-03 VITALS — BP 124/77 | HR 75 | Temp 97.5°F | Ht 63.0 in | Wt 163.2 lb

## 2012-09-03 DIAGNOSIS — K5901 Slow transit constipation: Secondary | ICD-10-CM

## 2012-09-03 DIAGNOSIS — R0602 Shortness of breath: Secondary | ICD-10-CM | POA: Diagnosis not present

## 2012-09-03 DIAGNOSIS — G20 Parkinson's disease: Secondary | ICD-10-CM | POA: Diagnosis not present

## 2012-09-03 MED ORDER — POLYETHYLENE GLYCOL 3350 17 GM/SCOOP PO POWD: 17.0000 g | Freq: Two times a day (BID) | ORAL | Status: DC | PRN

## 2012-09-03 NOTE — Progress Notes (Signed)
  Subjective:    Patient ID: Melissa Douglas, female    DOB: June 27, 1941, 71 y.o.   MRN: [id number]  HPI Constipation- side effect of requip.  Hx of similar.  Taking benefiber TID but reports this is not effective.  Drinking plenty of fluids, eating high fiber diet.  Parkinson's- recently dx'd be neuro and started on requip.  Tremor has stopped.  Med is causing nausea and constipation.  SOB- pt is very active typically and recently went up the stairs and was very short of breath which is unusual for her.  Also finding herself winded on her daily walks.  Will have associated R sided shoulder blade pain/itching.  Denies palpitations, CP.  No SOB at rest.  Aware that she has poor/hunched posture.   Review of Systems For ROS see HPI     Objective:   Physical Exam  Vitals reviewed. Constitutional: She is oriented to person, place, and time. She appears well-developed and well-nourished. No distress.  HENT:  Head: Normocephalic and atraumatic.  Eyes: Conjunctivae normal and EOM are normal. Pupils are equal, round, and reactive to light.  Neck: Normal range of motion. Neck supple. No thyromegaly present.  Cardiovascular: Normal rate, regular rhythm, normal heart sounds and intact distal pulses.   No murmur heard. Pulmonary/Chest: Effort normal and breath sounds normal. No respiratory distress. She has no wheezes. She has no rales.  Abdominal: Soft. She exhibits no distension. There is no tenderness.  Musculoskeletal: She exhibits no edema.       Pt w/ hunched posture + TTP over muscles both superior and inferior to spine of R scapula  Lymphadenopathy:    She has no cervical adenopathy.  Neurological: She is alert and oriented to person, place, and time.       R sided pill rolling tremor much improved, some cogwheeling/rigidity on R  Skin: Skin is warm and dry.  Psychiatric: She has a normal mood and affect. Her behavior is normal.          Assessment & Plan:

## 2012-09-03 NOTE — Patient Instructions (Addendum)
Go to a medical supply store and get an incentive spirometer- use this at least twice daily to increase lung capacity Use the albuterol inhaler- 2 puffs as needed for shortness of breath Start the Miralax 1-2x/day to improve constipation Continue the Benefiber twice daily If no improvement in the shortness of breath- call me! Take tylenol for the back pain and try and be mindful of your posture Call with any questions or concerns Hang in there!!

## 2012-09-05 DIAGNOSIS — Z23 Encounter for immunization: Secondary | ICD-10-CM | POA: Diagnosis not present

## 2012-09-12 DIAGNOSIS — G20 Parkinson's disease: Secondary | ICD-10-CM | POA: Diagnosis not present

## 2012-09-12 DIAGNOSIS — E039 Hypothyroidism, unspecified: Secondary | ICD-10-CM | POA: Diagnosis not present

## 2012-09-12 DIAGNOSIS — G030 Nonpyogenic meningitis: Secondary | ICD-10-CM | POA: Diagnosis not present

## 2012-09-12 DIAGNOSIS — R413 Other amnesia: Secondary | ICD-10-CM | POA: Diagnosis not present

## 2012-09-14 NOTE — Assessment & Plan Note (Signed)
New.  Dx suspected for years and confirmed recently by neuro.  Now on Requip.  Tremor has markedly improved.  Having constipation and nausea from meds- pt to call neuro to discuss.

## 2012-09-14 NOTE — Assessment & Plan Note (Signed)
New.  Suspect a component of this is due to poor posture and breathing mechanics.  Pt's shoulder pain is muscular and consistent w/ spasm.  Offered pulm/cards refer but pt declined.  Prefers to make concerted effort to improve posture and deep breathing.  Will get IS.  Reviewed supportive care and red flags that should prompt return.  Pt expressed understanding and is in agreement w/ plan.

## 2012-09-14 NOTE — Assessment & Plan Note (Signed)
New.  Pt w/ hx of similar but worse since starting Requip.  Start Mirlax 1-2x/day.  Continue high fiber diet.  Will follow.

## 2012-09-19 ENCOUNTER — Telehealth: Payer: Self-pay | Admitting: Family Medicine

## 2012-09-19 NOTE — Telephone Encounter (Signed)
In reference to ENT referral entered on 09/02/12, per my follow up call to patient to see if she is ready to schedule, she states she has not been having the nose bleeds since she began Miralax to keep her from straining during bowel movements.  Also patient asking if Dr. Beverely Low was sent copy of MRI Head performed by [facility] to see what her opinion is about what was seen in her nasal cavity.  Patient doesn't want to see anymore specialists, and will not see ENT until Dr. Beverely Low gives her advisement.  Please advise.

## 2012-09-19 NOTE — Telephone Encounter (Signed)
[facility] sent me office notes but not MRI to review.  Typically MRI of brain does not include sinus views.  We can certainly hold off on ENT if that's what she prefers.

## 2012-09-19 NOTE — Telephone Encounter (Signed)
noted 

## 2012-09-19 NOTE — Telephone Encounter (Signed)
Pt is contacting [facility] to have them resend MRI. Pt states that it did include a sinus view.

## 2012-09-24 ENCOUNTER — Telehealth: Payer: Self-pay | Admitting: Family Medicine

## 2012-09-24 MED ORDER — FENOFIBRATE 160 MG PO TABS: 160.0000 mg | ORAL_TABLET | Freq: Every day | ORAL | Status: DC

## 2012-09-24 NOTE — Telephone Encounter (Signed)
Per pharmacy Rx on file for levothyroxine but did not received rx on 07-2012. rx resent for fenofibrate.

## 2012-09-24 NOTE — Telephone Encounter (Signed)
Refill: Levothyroxine tab 112 mcg. 1 po daily. 90 day supply  Fenofibrate 160 mg. 1 po daily. 90 day supply

## 2012-10-08 ENCOUNTER — Telehealth: Payer: Self-pay | Admitting: Family Medicine

## 2012-10-08 NOTE — Telephone Encounter (Signed)
Called patient instructed her to place the pill in applesauce, pudding or ice cream to make swallowing easier. Also would like Dr. Rennis Golden opinion on continuing fenofibrate since cholesterol levels are coming down. Please advise. SGJ, RN

## 2012-10-08 NOTE — Telephone Encounter (Signed)
In reference to the ENT referral entered on 09/02/12, I have spoke with patient & advised her that Dr. Beverely Low has never received a copy of the CT report from [facility].  Patient states she asked [facility] to send copy of report several times, but in the mean time states her nose is better.  She is no longer having the nose bleeds and would like to cancel the referral.

## 2012-10-08 NOTE — Telephone Encounter (Signed)
Pt LMOVM asking since last few labs cholesterol has been okay does she need to still be taking fenofibrate? Pt states if so can she get a smaller pill, different cholesterol med that pill is smaller due to Parkinson's and meds taking for that throat stays dry hard to swallow fenofibrate.  PLz advise     MW CB# [phone]

## 2012-10-10 NOTE — Telephone Encounter (Signed)
Can hold fenofibrate for 6 months and we will recheck levels at that time.

## 2012-10-10 NOTE — Telephone Encounter (Signed)
Discuss with patient  

## 2012-11-03 ENCOUNTER — Telehealth: Payer: Self-pay | Admitting: Family Medicine

## 2012-11-03 DIAGNOSIS — E039 Hypothyroidism, unspecified: Secondary | ICD-10-CM

## 2012-11-03 MED ORDER — LEVOTHYROXINE SODIUM 112 MCG PO TABS: 112.0000 ug | ORAL_TABLET | Freq: Every day | ORAL | Status: DC

## 2012-11-03 NOTE — Telephone Encounter (Signed)
Refill for levothyroxine sent to pharmacy.

## 2012-11-03 NOTE — Telephone Encounter (Signed)
refill Levothyroxine (Tab) 112 MCG Take 1 tablet (112 mcg total) by mouth daily. #30 last fill 11.26.13

## 2012-11-27 ENCOUNTER — Ambulatory Visit: Payer: Medicare Other | Admitting: Family Medicine

## 2013-01-09 ENCOUNTER — Encounter (HOSPITAL_COMMUNITY): Payer: Self-pay | Admitting: *Deleted

## 2013-01-09 ENCOUNTER — Emergency Department (HOSPITAL_COMMUNITY)
Admission: EM | Admit: 2013-01-09 | Discharge: 2013-01-09 | Disposition: A | Discharge status: Home / Self Care | Payer: Medicare Other | Attending: Emergency Medicine | Admitting: Emergency Medicine

## 2013-01-09 DIAGNOSIS — R5381 Other malaise: Secondary | ICD-10-CM | POA: Diagnosis not present

## 2013-01-09 DIAGNOSIS — Z87891 Personal history of nicotine dependence: Secondary | ICD-10-CM | POA: Insufficient documentation

## 2013-01-09 DIAGNOSIS — M2550 Pain in unspecified joint: Secondary | ICD-10-CM | POA: Insufficient documentation

## 2013-01-09 DIAGNOSIS — E039 Hypothyroidism, unspecified: Secondary | ICD-10-CM | POA: Insufficient documentation

## 2013-01-09 DIAGNOSIS — R040 Epistaxis: Secondary | ICD-10-CM | POA: Insufficient documentation

## 2013-01-09 DIAGNOSIS — Z79899 Other long term (current) drug therapy: Secondary | ICD-10-CM | POA: Diagnosis not present

## 2013-01-09 DIAGNOSIS — G20 Parkinson's disease: Secondary | ICD-10-CM | POA: Insufficient documentation

## 2013-01-09 DIAGNOSIS — R110 Nausea: Secondary | ICD-10-CM | POA: Diagnosis not present

## 2013-01-09 DIAGNOSIS — R42 Dizziness and giddiness: Secondary | ICD-10-CM | POA: Insufficient documentation

## 2013-01-09 DIAGNOSIS — R404 Transient alteration of awareness: Secondary | ICD-10-CM | POA: Diagnosis not present

## 2013-01-09 DIAGNOSIS — Z8739 Personal history of other diseases of the musculoskeletal system and connective tissue: Secondary | ICD-10-CM | POA: Diagnosis not present

## 2013-01-09 DIAGNOSIS — G20A1 Parkinson's disease without dyskinesia, without mention of fluctuations: Secondary | ICD-10-CM | POA: Insufficient documentation

## 2013-01-09 DIAGNOSIS — R5383 Other fatigue: Secondary | ICD-10-CM | POA: Insufficient documentation

## 2013-01-09 LAB — URINALYSIS, ROUTINE W REFLEX MICROSCOPIC
Bilirubin Urine: NEGATIVE
Glucose, UA: NEGATIVE mg/dL
Hgb urine dipstick: NEGATIVE
Ketones, ur: NEGATIVE mg/dL
Protein, ur: NEGATIVE mg/dL
Urobilinogen, UA: 1 mg/dL (ref 0.0–1.0)

## 2013-01-09 LAB — BASIC METABOLIC PANEL
BUN: 18 mg/dL (ref 6–23)
Chloride: 108 mEq/L (ref 96–112)
GFR calc Af Amer: >90 mL/min (ref >90)
Potassium: 3.9 mEq/L (ref 3.5–5.1)
Sodium: 143 mEq/L (ref 135–145)

## 2013-01-09 LAB — CBC WITH DIFFERENTIAL/PLATELET
Basophils Relative: 0 % (ref 0–1)
Hemoglobin: 13.1 g/dL (ref 12.0–15.0)
Lymphs Abs: 1.5 10*3/uL (ref 0.7–4.0)
MCHC: 34.2 g/dL (ref 30.0–36.0)
Monocytes Relative: 7 % (ref 3–12)
Neutro Abs: 4.6 10*3/uL (ref 1.7–7.7)
Neutrophils Relative %: 69 % (ref 43–77)
RBC: 4.46 MIL/uL (ref 3.87–5.11)

## 2013-01-09 MED ORDER — SODIUM CHLORIDE 0.9 % IV BOLUS (SEPSIS)
500.0000 mL | Freq: Once | INTRAVENOUS | Status: AC
  Administered 2013-01-09: 500 mL via INTRAVENOUS

## 2013-01-09 MED ORDER — ONDANSETRON HCL 4 MG/2ML IJ SOLN
4.0000 mg | Freq: Once | INTRAMUSCULAR | Status: AC
  Administered 2013-01-09: 4 mg via INTRAVENOUS
  Filled 2013-01-09: qty 2

## 2013-01-09 MED ORDER — AMOXICILLIN-POT CLAVULANATE 875-125 MG PO TABS: 1.0000 | ORAL_TABLET | Freq: Two times a day (BID) | ORAL | Status: DC

## 2013-01-09 NOTE — ED Provider Notes (Signed)
History     CSN: [id number]  Arrival date & time 01/09/13  1022   First MD Initiated Contact with Patient 01/09/13 1046      Chief Complaint  Patient presents with  . Dizziness  . Nausea    (Consider location/radiation/quality/duration/timing/severity/associated sxs/prior treatment) HPI Comments: Patient with history of Parkinson's disease and hypothyroidism presents with complaint of lightheadedness and generalized fatigue. Patient experienced a nosebleed early this morning which is not unusual for her. She states that it was very heavy and it took some time to control the bleeding. She estimates that she lost 1/2-2/3 cups of blood. EMS was called and her blood pressures be elevated but the bleeding resolved EMS was dismissed. Patient continued to feel very lightheaded like she was going to pass out but did not experience full syncope. EMS was called again and patient was transported to the hospital for evaluation. She has not had her nosebleed resume but she continues to feel lightheaded. Patient denies fever, cold symptoms, vomiting, diarrhea. She has not noted blood in her stool, urine, and gums. Is not on any blood thinning medications. Onset of symptoms acute. Course is constant. Nothing makes symptoms better or worse.  The history is provided by the patient and a relative.    Past Medical History  Diagnosis Date  . Chronic joint pain   . Hypothyroidism   . Osteopenia   . Parkinson's disease     Past Surgical History  Procedure Laterality Date  . Abdominal hysterectomy      ovaries remain  . Appendectomy    . Tonsillectomy      No family history on file.  History  Substance Use Topics  . Smoking status: Former Games developer  . Smokeless tobacco: Not on file  . Alcohol Use: Yes     Comment: occ.    OB History   Grav Para Term Preterm Abortions TAB SAB Ect Mult Living                  Review of Systems  Constitutional: Negative for fever.  HENT: Negative for sore  throat and rhinorrhea.   Eyes: Negative for redness.  Respiratory: Negative for cough.   Cardiovascular: Negative for chest pain, palpitations and leg swelling.  Gastrointestinal: Negative for nausea, vomiting, abdominal pain and diarrhea.  Genitourinary: Negative for dysuria.  Musculoskeletal: Negative for myalgias.  Skin: Negative for rash.  Neurological: Positive for light-headedness. Negative for headaches.    Allergies  Codeine and Gabapentin  Home Medications   Current Outpatient Rx  Name  Route  Sig  Dispense  Refill  . acetaminophen (TYLENOL) 500 MG tablet   Oral   Take 500 mg by mouth as directed. Take 2-4 tabs daily           . fenofibrate 160 MG tablet   Oral   Take 1 tablet (160 mg total) by mouth daily.   90 tablet   0   . levothyroxine (SYNTHROID, LEVOTHROID) 112 MCG tablet   Oral   Take 1 tablet (112 mcg total) by mouth daily.   30 tablet   5   . loratadine (CLARITIN) 10 MG tablet   Oral   Take 10 mg by mouth as needed.           . polyethylene glycol powder (GLYCOLAX/MIRALAX) powder   Oral   Take 17 g by mouth 2 (two) times daily as needed.   3350 g   3   . rOPINIRole (REQUIP XL) 2 MG  24 hr tablet   Oral   Take 2 mg by mouth at bedtime.           BP 146/56  Pulse 67  Temp(Src) 98.4 F (36.9 C) (Oral)  Resp 16  SpO2 99%  Physical Exam  Nursing note and vitals reviewed. Constitutional: She is oriented to person, place, and time. She appears well-developed and well-nourished.  HENT:  Head: Normocephalic and atraumatic.  Right Ear: Tympanic membrane, external ear and ear canal normal.  Left Ear: Tympanic membrane, external ear and ear canal normal.  Nose: Nose normal.  Mouth/Throat: Uvula is midline, oropharynx is clear and moist and mucous membranes are normal.  Clot noted in left nare, small amount of blood noted posterior pharynx.   Eyes: Conjunctivae, EOM and lids are normal. Pupils are equal, round, and reactive to light.  Right eye exhibits no discharge. Left eye exhibits no discharge. Right eye exhibits no nystagmus. Left eye exhibits no nystagmus.  No nystagmus  Neck: Normal range of motion. Neck supple.  Cardiovascular: Normal rate, regular rhythm and normal heart sounds.   Pulmonary/Chest: Effort normal and breath sounds normal.  Abdominal: Soft. There is no tenderness.  Musculoskeletal:       Cervical back: She exhibits normal range of motion, no tenderness and no bony tenderness.  Neurological: She is alert and oriented to person, place, and time. She has normal strength and normal reflexes. No cranial nerve deficit or sensory deficit. She displays a negative Romberg sign. Coordination and gait normal. GCS eye subscore is 4. GCS verbal subscore is 5. GCS motor subscore is 6.  Skin: Skin is warm and dry.  Psychiatric: She has a normal mood and affect.    ED Course  Procedures (including critical care time)  Labs Reviewed  BASIC METABOLIC PANEL - Abnormal; Notable for the following:    Glucose, Bld 110 (*)    GFR calc non Af Amer 88 (*)    All other components within normal limits  CBC WITH DIFFERENTIAL  URINALYSIS, ROUTINE W REFLEX MICROSCOPIC   No results found.   1. Lightheadedness   2. Epistaxis   3. Nausea     10:57 AM Patient seen and examined. Work-up initiated.    Vital signs reviewed and are as follows: Filed Vitals:   01/09/13 1032  BP: 146/56  Pulse: 67  Temp: 98.4 F (36.9 C)  Resp: 16    Date: 01/09/2013  Rate: 62  Rhythm: normal sinus rhythm  QRS Axis: normal  Intervals: normal  ST/T Wave abnormalities: normal  Conduction Disutrbances:none  Narrative Interpretation:   Old EKG Reviewed: unchanged from 04/07/10  Patient d/w and seen by Dr. Anitra Lauth.   Work-up is reassuring. Patient and husband informed of results. Patient has normal orthostatics and has ambulated without dizziness. She appears well. She is agreeable to discharge home at this time.  Patient is  concerned about developing a sinusitis due to recent green nasal drainage. She wants to try saline irrigation but requests a course of antibiotics if this fails. She states that this has helped her in the past. Augmentin prescribed patient to fill in 2 days if needed if not improved.  Urged primary care physician followup in the next week. Patient states that she is seeing her neurologist in 3 days.  Urged to return with worsening uncontrolled nosebleed, syncope, other areas of bleeding.   MDM  Lightheadedness: Likely multifactorial. Suspect vasovagal component due to nausea and blood loss. No anemia. EKG is reassuring. Patient did  not have any chest pain shortness of breath. Normal neurological exam emergency department. Patient appears well. Workup in vital signs are reassuring. Patient has good followup.       [place], [place] 01/09/13 1427

## 2013-01-09 NOTE — ED Provider Notes (Signed)
Medical screening examination/treatment/procedure(s) were conducted as a shared visit with non-physician practitioner(s) and myself.  I personally evaluated the patient during the encounter Patient with a history of recurrent nosebleeds and Parkinson disease who had a large nosebleed today and after became near syncopal. She denies chest pain or shortness of breath but does complain of some mild nausea. Here her vital signs are within normal limits. EKG and labs are normal. Feel most likely this was vasovagal after her nosebleed. She denies any recent medication changes and is well-appearing. She was able to ambulate without dizziness  Gwyneth Sprout, MD 01/09/13 1513

## 2013-01-09 NOTE — ED Notes (Addendum)
Per EMS pt from home with c/o nausea, dizziness. EMS states pt called earlier for nosebleed and was found to be HTN and not brought in at that time. Pt began to feel dizzy and called EMS again. BP 160/70 HR 68. Stroke screen negative. 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 Family concerned this may be related to today's symptoms.

## 2013-01-20 ENCOUNTER — Telehealth: Payer: Self-pay | Admitting: Family Medicine

## 2013-01-20 DIAGNOSIS — R040 Epistaxis: Secondary | ICD-10-CM

## 2013-01-20 NOTE — Telephone Encounter (Signed)
Ok for ENT referral- dx epistaxis

## 2013-01-20 NOTE — Telephone Encounter (Signed)
Spoke with the pt and informed her that Dr. Beverely Low agreed to the ENT referral and it has been sent, and she will hear back with an appt.//AB/CMA

## 2013-01-20 NOTE — Telephone Encounter (Signed)
Patient is requesting a referral to an ENT regarding her nose bleeds

## 2013-01-20 NOTE — Telephone Encounter (Signed)
Please advise on referral to ENT.  Pt was seen on 07-22-12 for nose bleeds.//AB/CMA

## 2013-02-03 DIAGNOSIS — J339 Nasal polyp, unspecified: Secondary | ICD-10-CM | POA: Diagnosis not present

## 2013-02-03 DIAGNOSIS — R040 Epistaxis: Secondary | ICD-10-CM | POA: Diagnosis not present

## 2013-02-05 ENCOUNTER — Ambulatory Visit: Payer: Self-pay | Admitting: Neurology

## 2013-02-11 DIAGNOSIS — R040 Epistaxis: Secondary | ICD-10-CM | POA: Diagnosis not present

## 2013-02-11 DIAGNOSIS — J339 Nasal polyp, unspecified: Secondary | ICD-10-CM | POA: Diagnosis not present

## 2013-02-11 DIAGNOSIS — D1809 Hemangioma of other sites: Secondary | ICD-10-CM | POA: Diagnosis not present

## 2013-04-02 ENCOUNTER — Other Ambulatory Visit: Payer: Self-pay | Admitting: General Practice

## 2013-04-02 MED ORDER — LEVOTHYROXINE SODIUM 112 MCG PO TABS: 112.0000 ug | ORAL_TABLET | Freq: Every day | ORAL | Status: DC

## 2013-04-02 NOTE — Telephone Encounter (Signed)
Pt will need upcoming labs.

## 2013-04-09 ENCOUNTER — Telehealth: Payer: Self-pay | Admitting: Family Medicine

## 2013-04-09 NOTE — Telephone Encounter (Signed)
Patient is calling to see what she can take for her cold she has. Says that she was going to take Robitussin but she is unable to because the other medications she is taking cannot be mixed with it. Please advise.

## 2013-04-09 NOTE — Telephone Encounter (Signed)
Spoke with the pt and informed her that she will need and OV for her cold.  Informed the pt that unable to call-in meds without see the her.  Pt agreed and scheduled an appt.//AB/CMA

## 2013-04-10 ENCOUNTER — Ambulatory Visit (INDEPENDENT_AMBULATORY_CARE_PROVIDER_SITE_OTHER): Payer: Medicare Other | Admitting: Family Medicine

## 2013-04-10 ENCOUNTER — Encounter: Payer: Self-pay | Admitting: Family Medicine

## 2013-04-10 VITALS — BP 140/60 | HR 63 | Temp 98.1°F | Ht 63.0 in | Wt 158.0 lb

## 2013-04-10 DIAGNOSIS — E781 Pure hyperglyceridemia: Secondary | ICD-10-CM

## 2013-04-10 DIAGNOSIS — G20 Parkinson's disease: Secondary | ICD-10-CM

## 2013-04-10 LAB — HEPATIC FUNCTION PANEL
ALT: 24 U/L (ref 0–35)
AST: 17 U/L (ref 0–37)
Total Protein: 6.6 g/dL (ref 6.0–8.3)

## 2013-04-10 LAB — LIPID PANEL
Cholesterol: 168 mg/dL (ref 0–200)
Triglycerides: 210 mg/dL — ABNORMAL HIGH (ref 0.0–149.0)

## 2013-04-10 NOTE — Patient Instructions (Addendum)
Please schedule your complete physical at your convenience We'll notify you of your lab results and make any changes if needed You can take Claritin or Zyrtec D, Robitussin, Mucinex- NO DEXTROMETHORPHAN!! Call with any questions or concerns Hang in there!

## 2013-04-10 NOTE — Progress Notes (Signed)
  Subjective:    Patient ID: Melissa Douglas, female    DOB: 1940-12-14, 72 y.o.   MRN: [id number]  HPI Medication questions- pt on Azilect (MAOI) and Requip for Parkinson's Disease.  Pt concerned about what medications she is able to take- particularly allergy/cough/cold meds.  Hypertriglyceridemia- pt on fenofibrate and having difficulty swallowing pill due to size.  Would like labs checked to see if she needs to continue this med.  Review of Systems For ROS see HPI     Objective:   Physical Exam  Vitals reviewed. Constitutional: She is oriented to person, place, and time. She appears well-developed and well-nourished. No distress.  HENT:  Head: Normocephalic and atraumatic.  Eyes: Conjunctivae and EOM are normal. Pupils are equal, round, and reactive to light.  Neck: Normal range of motion. Neck supple. No thyromegaly present.  Cardiovascular: Normal rate, regular rhythm, normal heart sounds and intact distal pulses.   No murmur heard. Pulmonary/Chest: Effort normal and breath sounds normal. No respiratory distress.  Abdominal: Soft. She exhibits no distension. There is no tenderness.  Musculoskeletal: She exhibits no edema.  Lymphadenopathy:    She has no cervical adenopathy.  Neurological: She is alert and oriented to person, place, and time.  Skin: Skin is warm and dry.  Psychiatric: She has a normal mood and affect. Her behavior is normal.          Assessment & Plan:

## 2013-04-12 NOTE — Assessment & Plan Note (Signed)
Chronic problem.  Due to Parkinson's dx, pt no longer able to swallow fenofibrate comfortably.  Check labs to determine whether pt still requires medication.  Will adjust meds accordingly.

## 2013-04-12 NOTE — Assessment & Plan Note (Signed)
Pt on MAOI for Parkinson's.  Reviewed meds that pt can and can't take- no dextromethorphan.  Pt able to take decongestant prn as well as OTC antihistamine.  Pt expressed understanding and is in agreement w/ plan.

## 2013-04-13 ENCOUNTER — Other Ambulatory Visit: Payer: Self-pay | Admitting: General Practice

## 2013-04-13 MED ORDER — FENOFIBRATE MICRONIZED 200 MG PO CAPS: 200.0000 mg | ORAL_CAPSULE | Freq: Every day | ORAL | Status: DC

## 2013-04-25 ENCOUNTER — Other Ambulatory Visit: Payer: Self-pay | Admitting: Family Medicine

## 2013-04-27 NOTE — Telephone Encounter (Signed)
Rx sent to the pharmacy by e-script.  Note sent to the pharmacy that the pt is due an CPE.  Mailed letter informing the pt to please call and schedule an appt.//AB/CMA

## 2013-06-17 ENCOUNTER — Ambulatory Visit (INDEPENDENT_AMBULATORY_CARE_PROVIDER_SITE_OTHER): Payer: Medicare Other | Admitting: Internal Medicine

## 2013-06-17 VITALS — BP 125/58 | HR 67 | Temp 98.0°F | Wt 160.4 lb

## 2013-06-17 DIAGNOSIS — H5789 Other specified disorders of eye and adnexa: Secondary | ICD-10-CM | POA: Diagnosis not present

## 2013-06-17 NOTE — Patient Instructions (Addendum)
Subconjunctival Hemorrhage °A subconjunctival hemorrhage is a bright red patch covering a portion of the white of the eye. The white part of the eye is called the sclera, and it is covered by a thin membrane called the conjunctiva. This membrane is clear, except for tiny blood vessels that you can see with the naked eye. When your eye is irritated or inflamed and becomes red, it is because the vessels in the conjunctiva are swollen. °Sometimes, a blood vessel in the conjunctiva can break and bleed. When this occurs, the blood builds up between the conjunctiva and the sclera, and spreads out to create a red area. The red spot may be very small at first. It may then spread to cover a larger part of the surface of the eye, or even all of the visible white part of the eye. °In almost all cases, the blood will go away and the eye will become white again. Before completely dissolving, however, the red area may spread. It may also become brownish-yellow in color, before going away. If a lot of blood collects under the conjunctiva, it may look like a bulge on the surface of the eye. This looks scary, but it will also eventually flatten out and go away. Subconjunctival hemorrhages do not cause pain, but if swollen, may cause a feeling of irritation. There is no effect on vision.  °CAUSES  °· The most common cause is mild trauma (rubbing the eye, irritation). °· Subconjunctival hemorrhages can happen because of coughing or straining (lifting heavy objects), vomiting, or sneezing. °· In some cases, your doctor may want to check your blood pressure. High blood pressure can also cause a sunconjunctival hemorrhage. °· Severe trauma or blunt injuries. °· Diseases that affect blood clotting (hemophilia, leukemia). °· Abnormalities of blood vessels behind the eye (carotid cavernous sinus fistula). °· Tumors behind the eye. °· Certain drugs (aspirin, coumadin, heparin). °· Recent eye surgery. °HOME CARE INSTRUCTIONS  °· Do not worry  about the appearance of your eye. You may continue your usual activities. °· Often, follow-up is not necessary. °SEEK MEDICAL CARE IF:  °· Your eye becomes painful. °· The bleeding does not disappear within 3 weeks. °· Bleeding occurs elsewhere, for example, under the skin, in the mouth, or in the other eye. °· You have recurring subconjunctival hemorrhages. °SEEK IMMEDIATE MEDICAL CARE IF:  °· Your vision changes or you have difficulty seeing. °· You develop severe headache, persistent vomiting, confusion, or abnormal drowsiness (lethargy). °· Your eye seems to bulge or protrude from the eye socket. °· You notice the sudden appearance of bruises, or have spontaneous bleeding elsewhere on your body. °Document Released: 10/29/2005 Document Revised: 01/21/2012 Document Reviewed: 09/26/2009 °ExitCare® Patient Information ©2014 [employer]. ° °

## 2013-06-17 NOTE — Progress Notes (Signed)
  Subjective:    Patient ID: Melissa Douglas, female    DOB: [birth year], 71 y.o.   MRN: [id number]  HPI Acute visit Woke up w/ a red conjuntiva this morning, looks a little better this afternoon but not completely well.  Past Medical History  Diagnosis Date  . Chronic joint pain   . Hypothyroidism   . Osteopenia   . Parkinson's disease    Past Surgical History  Procedure Laterality Date  . Abdominal hysterectomy      ovaries remain  . Appendectomy    . Tonsillectomy      Review of Systems Denies any eye injury No blurred vision. No eye itching, pain or d/c  Mild cough last night?     Objective:   Physical Exam  Eyes:     BP 125/58  Pulse 67  Temp(Src) 98 F (36.7 C) (Oral)  Wt 160 lb 6.4 oz (72.757 kg)  BMI 28.42 kg/m2  SpO2 99% General -- alert, well-developed, NAD .    HEENT --  EOMI, PERLA, no d/c Neurologic-- alert & oriented X3 and strength normal in all extremities. Psych-- Cognition and judgment appear intact. Alert and cooperative with normal attention span and concentration.  not anxious appearing and not depressed appearing.        Assessment & Plan:  Findings c/w subconjuntival hemorrhage, Pt reassured, rec observation, sx may be related w/ mild cough last night. Info provided, will call if sx increase, blurred vision, eye pain  Also , noted she take afrin daily, advise against that practice, states she has a sinus problem, rec to discuss w/ Dr Jearld Fenton, her ENT

## 2013-06-18 ENCOUNTER — Encounter: Payer: Self-pay | Admitting: Internal Medicine

## 2013-07-25 ENCOUNTER — Encounter: Payer: Self-pay | Admitting: Neurology

## 2013-07-27 ENCOUNTER — Encounter: Payer: Self-pay | Admitting: Neurology

## 2013-07-27 ENCOUNTER — Ambulatory Visit (INDEPENDENT_AMBULATORY_CARE_PROVIDER_SITE_OTHER): Payer: Medicare Other | Admitting: Neurology

## 2013-07-27 VITALS — BP 138/65 | HR 68 | Ht 62.5 in | Wt 160.0 lb

## 2013-07-27 DIAGNOSIS — E559 Vitamin D deficiency, unspecified: Secondary | ICD-10-CM | POA: Diagnosis not present

## 2013-07-27 DIAGNOSIS — E781 Pure hyperglyceridemia: Secondary | ICD-10-CM

## 2013-07-27 DIAGNOSIS — G20 Parkinson's disease: Secondary | ICD-10-CM

## 2013-07-27 DIAGNOSIS — R5383 Other fatigue: Secondary | ICD-10-CM

## 2013-07-27 DIAGNOSIS — R5381 Other malaise: Secondary | ICD-10-CM | POA: Diagnosis not present

## 2013-07-27 MED ORDER — ROPINIROLE HCL ER 6 MG PO TB24: 4.0000 mg | ORAL_TABLET | Freq: Every day | ORAL | Status: DC

## 2013-07-27 NOTE — Progress Notes (Signed)
History of Present Illness:   Melissa Douglas is a 72years old left hand Caucasian female, accompanied by her husband, referred by her primary care physician Dr. 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  3 years ago, she noticed right hand resting tremor, gradually getting worse, recent couple months, also noticed mild left hand tremor, she denies gait difficulty, she has decreased his sense of smell, she has excessive wild dreams, talking out of her dreams, occasionally constipation, no orthostatic symptoms.  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  She complained of right buttock area pain, shooting pain along right posterior thigh in 2013, no weakness,getting better when standing up, which has improved now. She denies gait difficulty, she has excessive nose bleeding  Review of Systems  Out of a complete 14 system review, the patient complains of only the following symptoms, and all other reviewed systems are negative.  Musculoskeletal: Joint pain   Allergy/Immunology: Allergies     Social History  Patient lives at home with her husband and has a college education. Patient denies any history of tobacco, illicit drugs and caffeine. Patient drinks alcohol 2 to 36 days a week. Inhaled Tobacco Use: never smoker  Family History  Patients  parents are both deceased.  Past Medical History  None listed  Surgical History  Hysterectomy Appendix     Physical Exam  Neck: supple no carotid bruits Respiratory: clear to auscultation bilaterally Cardiovascular: regular rate rhythm Musculoskeletal: right cross leg exam was normal.  Neurologic Exam  Mental Status: pleasant, awake, alert, cooperative to history, talking, and casual conversation.   Cranial Nerves: CN II-XII pupils were equal round reactive to light.  Fundi were sharp bilaterally.  Extraocular movements were full.  Visual fields were full on confrontational test.  Facial sensation and strength were normal.  Hearing was intact to finger rubbing bilaterally.  Uvula tongue were midline.  Head turning and shoulder shrugging were normal and symmetric.  Tongue protrusion into the cheeks strength were normal.  Motor: bilateral hand right more than left resting tremor, mild rigidity with reenforcement, no weakness, early fatiguability of rapid wrist opening/closure, finger tapping. Sensory: Normal to light touch, pinprick, proprioception, and vibratory sensation. Coordination: Normal finger-to-nose, heel-to-shin.  There was no dysmetria noticed. Gait and Station: Narrow based and steady, moderate stride, mild decreased right arm swing, smooth turning, mild retropulse instability. Reflexes: Deep tendon reflexes: Biceps: 2/2, Brachioradialis: 2/2, Triceps: 2/2, Pateller: 2/2, Achilles: 1/1.  Plantar responses are flexor. right straight leg testing was negative  Assessment and Plan:   72 years old Caucasian female,  right hand tremor since 2010, on examination, she has mild parkinsonian features, resting tremor, right more than left rigidity, bradykinesia, also mild memory trouble  1,  Parkinson's disease 2.  Increase Requip xR to 6 mg every night, Azilect 1mg  qday 3. RTC in  12 months 4. Moderate exercise

## 2013-07-28 ENCOUNTER — Telehealth: Payer: Self-pay

## 2013-07-28 MED ORDER — ROPINIROLE HCL ER 6 MG PO TB24: 6.0000 mg | ORAL_TABLET | Freq: Every day | ORAL | Status: DC

## 2013-07-28 NOTE — Telephone Encounter (Signed)
Pharmacy called asking to verify Requip dose.  Rx was sent for 6mg , taking only 4mg  for each dose.  OV says: Increase Requip xR to 6 mg every night.  I have updated and resent the Rx.

## 2013-08-04 ENCOUNTER — Other Ambulatory Visit: Payer: Self-pay

## 2013-08-04 MED ORDER — RASAGILINE MESYLATE 1 MG PO TABS: 1.0000 mg | ORAL_TABLET | Freq: Every day | ORAL | Status: DC

## 2013-08-20 ENCOUNTER — Other Ambulatory Visit: Payer: Self-pay | Admitting: Family Medicine

## 2013-08-20 NOTE — Telephone Encounter (Signed)
Med filled. Letter mailed to pt to make a CPE>

## 2013-08-21 ENCOUNTER — Telehealth: Payer: Self-pay | Admitting: Neurology

## 2013-08-25 NOTE — Telephone Encounter (Signed)
Pt has question about drug reaction between requip and the flu shot. Please advise.

## 2013-10-21 ENCOUNTER — Telehealth: Payer: Self-pay | Admitting: Neurology

## 2013-10-21 NOTE — Telephone Encounter (Signed)
Shanda Bumps, can you help with this one. Thanks.

## 2013-10-21 NOTE — Telephone Encounter (Signed)
About medication Ropinirole 6mg  ER, [employer] is not going to cover. States he needs the Dr. To call and make a request to have it overruled as they discussed. Pt this is effective January 1 but she doesn't have enough meds to last till then. He states the process can't be done until after January 1 and will take up to 5days. He really does not want the meds changed. Pt does not want to take 3pills a day.

## 2013-10-25 ENCOUNTER — Other Ambulatory Visit: Payer: Self-pay | Admitting: Family Medicine

## 2013-10-26 NOTE — Telephone Encounter (Signed)
Med filled.  

## 2013-10-29 ENCOUNTER — Telehealth: Payer: Self-pay

## 2013-10-29 NOTE — Telephone Encounter (Signed)
[employer] [place] [employer] sent [place] a letter stating they have approved our request for coverage on Ropinirole effective until 10/23/2014.

## 2013-11-01 ENCOUNTER — Other Ambulatory Visit: Payer: Self-pay | Admitting: Family Medicine

## 2013-11-02 NOTE — Telephone Encounter (Signed)
Med filled.  

## 2013-11-26 ENCOUNTER — Other Ambulatory Visit: Payer: Self-pay | Admitting: Family Medicine

## 2013-11-26 NOTE — Telephone Encounter (Signed)
Med filled.  

## 2013-11-30 ENCOUNTER — Other Ambulatory Visit: Payer: Self-pay | Admitting: Family Medicine

## 2013-12-25 ENCOUNTER — Other Ambulatory Visit: Payer: Self-pay | Admitting: Family Medicine

## 2013-12-25 NOTE — Telephone Encounter (Signed)
Med filled and letter mailed to make appt.

## 2013-12-29 ENCOUNTER — Other Ambulatory Visit: Payer: Self-pay | Admitting: Family Medicine

## 2013-12-30 NOTE — Telephone Encounter (Signed)
Med filled, pt needs OV to receive any refills.

## 2014-01-13 ENCOUNTER — Other Ambulatory Visit: Payer: Self-pay | Admitting: Family Medicine

## 2014-01-13 DIAGNOSIS — Z1231 Encounter for screening mammogram for malignant neoplasm of breast: Secondary | ICD-10-CM

## 2014-01-31 ENCOUNTER — Other Ambulatory Visit: Payer: Self-pay | Admitting: Family Medicine

## 2014-02-01 NOTE — Telephone Encounter (Signed)
Med filled.  

## 2014-02-08 ENCOUNTER — Ambulatory Visit (HOSPITAL_BASED_OUTPATIENT_CLINIC_OR_DEPARTMENT_OTHER)
Admission: RE | Admit: 2014-02-08 | Discharge: 2014-02-08 | Disposition: A | Discharge status: Home / Self Care | Payer: Medicare Other | Source: Ambulatory Visit | Attending: Family Medicine | Admitting: Family Medicine

## 2014-02-08 DIAGNOSIS — Z1231 Encounter for screening mammogram for malignant neoplasm of breast: Secondary | ICD-10-CM | POA: Diagnosis not present

## 2014-03-18 ENCOUNTER — Telehealth: Payer: Self-pay | Admitting: *Deleted

## 2014-03-18 ENCOUNTER — Encounter: Payer: Self-pay | Admitting: *Deleted

## 2014-03-18 DIAGNOSIS — H269 Unspecified cataract: Secondary | ICD-10-CM | POA: Insufficient documentation

## 2014-03-18 NOTE — Telephone Encounter (Signed)
Medication List and Allergies: Reviewed and updated  90 day supply/mail order: none Local prescriptions: [facility], [place]  Immunizations Due:  A/P:  FH, [place] or Personal History: Reviewed and updated Flu Vaccine: 10/12/13 Tdap: Can't remember when she received it PNA: 11/2008 Shingles: Never had chickenpox CCS: Never had and is not interested in doing MMG: 02/09/2014 neg  BD: 04/2010 T Score -1.3 (Repeat in 3-5 years)

## 2014-03-19 ENCOUNTER — Ambulatory Visit (INDEPENDENT_AMBULATORY_CARE_PROVIDER_SITE_OTHER): Payer: Medicare Other | Admitting: Family Medicine

## 2014-03-19 ENCOUNTER — Encounter: Payer: Self-pay | Admitting: General Practice

## 2014-03-19 ENCOUNTER — Encounter: Payer: Self-pay | Admitting: Family Medicine

## 2014-03-19 VITALS — BP 138/80 | HR 64 | Temp 98.3°F | Resp 17 | Ht 62.5 in | Wt 162.0 lb

## 2014-03-19 DIAGNOSIS — E039 Hypothyroidism, unspecified: Secondary | ICD-10-CM | POA: Diagnosis not present

## 2014-03-19 DIAGNOSIS — Z0000 Encounter for general adult medical examination without abnormal findings: Secondary | ICD-10-CM | POA: Diagnosis not present

## 2014-03-19 DIAGNOSIS — E781 Pure hyperglyceridemia: Secondary | ICD-10-CM | POA: Diagnosis not present

## 2014-03-19 DIAGNOSIS — E663 Overweight: Secondary | ICD-10-CM | POA: Insufficient documentation

## 2014-03-19 DIAGNOSIS — M899 Disorder of bone, unspecified: Secondary | ICD-10-CM

## 2014-03-19 DIAGNOSIS — M949 Disorder of cartilage, unspecified: Secondary | ICD-10-CM | POA: Diagnosis not present

## 2014-03-19 DIAGNOSIS — G20 Parkinson's disease: Secondary | ICD-10-CM

## 2014-03-19 DIAGNOSIS — B369 Superficial mycosis, unspecified: Secondary | ICD-10-CM | POA: Insufficient documentation

## 2014-03-19 DIAGNOSIS — H269 Unspecified cataract: Secondary | ICD-10-CM

## 2014-03-19 LAB — LIPID PANEL
CHOLESTEROL: 169 mg/dL (ref 0–200)
HDL: 46.8 mg/dL (ref >39.00)
LDL CALC: 106 mg/dL — AB (ref 0–99)
Total CHOL/HDL Ratio: 4
Triglycerides: 80 mg/dL (ref 0.0–149.0)
VLDL: 16 mg/dL (ref 0.0–40.0)

## 2014-03-19 LAB — CBC WITH DIFFERENTIAL/PLATELET
BASOS ABS: 0 10*3/uL (ref 0.0–0.1)
Basophils Relative: 0.6 % (ref 0.0–3.0)
EOS ABS: 0.1 10*3/uL (ref 0.0–0.7)
Eosinophils Relative: 1.2 % (ref 0.0–5.0)
HEMATOCRIT: 37.1 % (ref 36.0–46.0)
Hemoglobin: 12.4 g/dL (ref 12.0–15.0)
LYMPHS ABS: 1.4 10*3/uL (ref 0.7–4.0)
Lymphocytes Relative: 20.6 % (ref 12.0–46.0)
MCHC: 33.3 g/dL (ref 30.0–36.0)
MCV: 85.6 fl (ref 78.0–100.0)
MONO ABS: 0.5 10*3/uL (ref 0.1–1.0)
Monocytes Relative: 7 % (ref 3.0–12.0)
Neutro Abs: 4.8 10*3/uL (ref 1.4–7.7)
Neutrophils Relative %: 70.6 % (ref 43.0–77.0)
PLATELETS: 231 10*3/uL (ref 150.0–400.0)
RBC: 4.33 Mil/uL (ref 3.87–5.11)
RDW: 13.1 % (ref 11.5–15.5)
WBC: 6.8 10*3/uL (ref 4.0–10.5)

## 2014-03-19 LAB — BASIC METABOLIC PANEL
BUN: 18 mg/dL (ref 6–23)
CHLORIDE: 107 meq/L (ref 96–112)
CO2: 25 mEq/L (ref 19–32)
Calcium: 9.4 mg/dL (ref 8.4–10.5)
Creatinine, Ser: 0.7 mg/dL (ref 0.4–1.2)
GFR: 91.55 mL/min (ref >60.00)
Glucose, Bld: 103 mg/dL — ABNORMAL HIGH (ref 70–99)
POTASSIUM: 4 meq/L (ref 3.5–5.1)
Sodium: 140 mEq/L (ref 135–145)

## 2014-03-19 LAB — TSH: TSH: 0.63 u[IU]/mL (ref 0.35–4.50)

## 2014-03-19 LAB — HEPATIC FUNCTION PANEL
ALBUMIN: 4.1 g/dL (ref 3.5–5.2)
ALK PHOS: 50 U/L (ref 39–117)
ALT: 24 U/L (ref 0–35)
AST: 20 U/L (ref 0–37)
BILIRUBIN TOTAL: 0.8 mg/dL (ref 0.2–1.2)
Bilirubin, Direct: 0 mg/dL (ref 0.0–0.3)
Total Protein: 7 g/dL (ref 6.0–8.3)

## 2014-03-19 MED ORDER — NYSTATIN 100000 UNIT/GM EX OINT: 1.0000 "application " | TOPICAL_OINTMENT | Freq: Two times a day (BID) | CUTANEOUS | Status: DC

## 2014-03-19 NOTE — Progress Notes (Signed)
   Subjective:    Patient ID: Melissa Douglas, female    DOB: [birth year], 73 y.o.   MRN: [id number]  HPI Here today for CPE.  Risk Factors: Hypothyroid- chronic problem, on levothyroxine.  Denies excessive fatigue, pt reports increased hair loss after starting Parkinson's meds.  Denies dry skin/nails.  + constipation Parkinson's- following w/ Dr Krista Blue.  On Azilect, Ropinirole.  sxs are stable Physical Activity: no regular activity Fall Risk: moderate due to Parkinson's Depression: denies current sxs Hearing: decreased to conversational tones and whispered voice ADL's: independent Cognitive: normal linear thought process, memory and attention intace Home Safety: safe at home, lives w/ husband Height, Weight, BMI, Visual Acuity: see vitals, vision corrected to 20/20 w/ glasses Counseling: UTD on mammo, refusing DEXA, refusing colonoscopy 'i don't think I ever want to do that'. Labs Ordered: See A&P Care Plan: See A&P    Review of Systems Patient reports no hearing changes, adenopathy,fever, weight change,  persistant/recurrent hoarseness , swallowing issues, chest pain, palpitations, edema, persistant/recurrent cough, hemoptysis, dyspnea (rest/exertional/paroxysmal nocturnal), gastrointestinal bleeding (melena, rectal bleeding), abdominal pain, significant heartburn, bowel changes, GU symptoms (dysuria, hematuria, incontinence), Gyn symptoms (abnormal  bleeding, pain),  syncope, focal weakness, memory loss, numbness & tingling, nail changes, abnormal bruising or bleeding, anxiety, or depression.  + vision changes, was told she has a cataract.  Pt thinks she saw Dr Delman Cheadle previously. + pannus rash     Objective:   Physical Exam General Appearance:    Alert, cooperative, no distress, appears stated age  Head:    Normocephalic, without obvious abnormality, atraumatic  Eyes:    PERRL, conjunctiva/corneas clear, EOM's intact, fundi    benign, both eyes  Ears:    Normal TM's and external ear  canals, both ears  Nose:   Nares normal, septum midline, mucosa normal, no drainage    or sinus tenderness  Throat:   Lips, mucosa, and tongue normal; teeth and gums normal  Neck:   Supple, symmetrical, trachea midline, no adenopathy;    Thyroid: no enlargement/tenderness/nodules  Back:     Symmetric, no curvature, ROM normal, no CVA tenderness  Lungs:     Clear to auscultation bilaterally, respirations unlabored  Chest Wall:    No tenderness or deformity   Heart:    Regular rate and rhythm, S1 and S2 normal, no murmur, rub   or gallop  Breast Exam:    Deferred to mammo  Abdomen:     Soft, non-tender, bowel sounds active all four quadrants,    no masses, no organomegaly  Genitalia:    Deferred at pt's request  Rectal:    Extremities:   Extremities normal, atraumatic, no cyanosis or edema  Pulses:   2+ and symmetric all extremities  Skin:   Skin color, texture, turgor normal, + fungal dermatitis in pannus folds  Lymph nodes:   Cervical, supraclavicular, and axillary nodes normal  Neurologic:   CNII-XII intact, + pill rolling tremor on R          Assessment & Plan:

## 2014-03-19 NOTE — Progress Notes (Signed)
Pre visit review using our clinic review tool, if applicable. No additional management support is needed unless otherwise documented below in the visit note. 

## 2014-03-19 NOTE — Patient Instructions (Addendum)
Follow up in 6 months to recheck triglycerides- sooner if needed We'll notify you of your lab results and make any changes if needed We'll call you with your eye exam Apply the Nystatin twice daily for the rash Call with any questions or concerns [place]!!!

## 2014-03-21 NOTE — Assessment & Plan Note (Signed)
New.  Start topical antifungal ointment

## 2014-03-21 NOTE — Assessment & Plan Note (Signed)
Noted on last labs.  Check labs.  Start meds prn.

## 2014-03-21 NOTE — Assessment & Plan Note (Signed)
Ongoing issue for pt.  Encouraged her to resume regular exercise.  Check labs to risk stratify.

## 2014-03-21 NOTE — Assessment & Plan Note (Signed)
Pt's PE WNL w/ exception of tremor on R.  Refusing colonoscopy.  UTD on mammo.  Check labs.  Anticipatory guidance provided.

## 2014-03-21 NOTE — Assessment & Plan Note (Signed)
New to provider, hx of similar for pt.  Will re-refer to ophthalmology

## 2014-03-21 NOTE — Assessment & Plan Note (Signed)
Chronic problem.  Check Vit D level.  Replete prn. 

## 2014-03-21 NOTE — Assessment & Plan Note (Signed)
Chronic problem.  Currently asymptomatic.  Check labs.  Adjust meds prn  

## 2014-03-21 NOTE — Assessment & Plan Note (Signed)
Following w/ Neuro.  On Azilect and Ropinirole.  Will follow along and assist as able.

## 2014-03-22 LAB — VITAMIN D 1,25 DIHYDROXY
VITAMIN D 1, 25 (OH) TOTAL: 70 pg/mL (ref 18–72)
Vitamin D2 1, 25 (OH)2: <8 pg/mL
Vitamin D3 1, 25 (OH)2: 70 pg/mL

## 2014-03-23 ENCOUNTER — Encounter: Payer: Self-pay | Admitting: General Practice

## 2014-03-30 DIAGNOSIS — H02839 Dermatochalasis of unspecified eye, unspecified eyelid: Secondary | ICD-10-CM | POA: Diagnosis not present

## 2014-03-30 DIAGNOSIS — H2510 Age-related nuclear cataract, unspecified eye: Secondary | ICD-10-CM | POA: Diagnosis not present

## 2014-04-12 DIAGNOSIS — H2510 Age-related nuclear cataract, unspecified eye: Secondary | ICD-10-CM | POA: Diagnosis not present

## 2014-04-17 ENCOUNTER — Other Ambulatory Visit: Payer: Self-pay | Admitting: Family Medicine

## 2014-04-19 NOTE — Telephone Encounter (Signed)
Med filled.  

## 2014-04-27 ENCOUNTER — Other Ambulatory Visit: Payer: Self-pay | Admitting: Family Medicine

## 2014-04-27 NOTE — Telephone Encounter (Signed)
Med filled.  

## 2014-05-27 ENCOUNTER — Encounter: Payer: Self-pay | Admitting: Internal Medicine

## 2014-05-27 ENCOUNTER — Telehealth: Payer: Self-pay | Admitting: Family Medicine

## 2014-05-27 DIAGNOSIS — Z1211 Encounter for screening for malignant neoplasm of colon: Secondary | ICD-10-CM

## 2014-05-27 NOTE — Telephone Encounter (Signed)
Caller name: judy  Call back number:[phone]   Reason for call:  Pt is wanting to get a referral to GI for colonoscopy.  Pt wants it in August.

## 2014-05-27 NOTE — Telephone Encounter (Signed)
Referral placed per last OV note as this being needed.

## 2014-07-15 ENCOUNTER — Telehealth: Payer: Self-pay | Admitting: Neurology

## 2014-07-15 NOTE — Telephone Encounter (Signed)
Rodena Piety from [facility] calling to state that patient's Ropinirole has been approved for 1 yr effective today, patient has been notified, and we will be receiving approval letters. If questions, please call.

## 2014-07-19 ENCOUNTER — Other Ambulatory Visit: Payer: Self-pay | Admitting: Neurology

## 2014-07-23 ENCOUNTER — Telehealth: Payer: Self-pay

## 2014-07-23 NOTE — Telephone Encounter (Signed)
[place] [employer] notified us they have approved our request for coverage on Ropinirole ER effective until 07/16/2015 Ref ID # [id number]

## 2014-07-28 ENCOUNTER — Encounter (INDEPENDENT_AMBULATORY_CARE_PROVIDER_SITE_OTHER): Payer: Self-pay

## 2014-07-28 ENCOUNTER — Encounter: Payer: Self-pay | Admitting: Adult Health

## 2014-07-28 ENCOUNTER — Ambulatory Visit (INDEPENDENT_AMBULATORY_CARE_PROVIDER_SITE_OTHER): Payer: Medicare Other | Admitting: Adult Health

## 2014-07-28 VITALS — BP 121/68 | HR 65 | Ht 62.5 in | Wt 164.0 lb

## 2014-07-28 DIAGNOSIS — G20 Parkinson's disease: Secondary | ICD-10-CM

## 2014-07-28 MED ORDER — ROPINIROLE HCL ER 8 MG PO TB24: 8.0000 mg | ORAL_TABLET | Freq: Every day | ORAL | Status: DC

## 2014-07-28 NOTE — Progress Notes (Signed)
PATIENT: Melissa Douglas DOB: Jun 15, 1941  REASON FOR VISIT: follow up HISTORY FROM: patient  HISTORY OF PRESENT ILLNESS: Melissa Douglas is a 73 year old female with a history of parkinson's disease and vitamin D deficiency. She returns today for follow-up. She currently takes azilect 1 mg daily and Requip 6 mg daily. Tremor remains primarily in the right hand but she is does have a mild tremor in the left hand.the tremor in the left hand has progressed mildly since the last visit according to her husband. She states that she had no issues with her walking. Husband states that when she stands after sitting for a long time her hips are a little stiff. She has not noticed any change in her balance but she does get nervous if she doesn't have anything to grab onto. Denies any recent falls. Denies any trouble with swallowing. Patient has not been exercising regularly. 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 She states that she is sleeping well. She gets up about 1 time during the night to urinate. She has noticed that she is loosing some of her hair. She states that this did not start with the Requip or azilect. She is contemplating using an OTC treatment for hair loss. She brought in the treatment for hair loss but I was not familiar with nor was Dr. Janann Colonel.   HISTORY 07/27/13 (YY): 73years old left hand Caucasian female, accompanied by her husband, referred by her primary care physician Dr. 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  3 years ago, she noticed right hand resting tremor, gradually getting worse, recent couple months, also noticed mild left hand tremor, she denies gait difficulty, she has decreased his sense of  smell, she has excessive wild dreams, talking out of her dreams, occasionally constipation, no orthostatic symptoms.  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  She complained of right buttock area pain, shooting pain along right posterior thigh in 2013, no weakness,getting better when standing up, which has improved now. She denies gait difficulty, she has excessive nose bleeding   REVIEW OF SYSTEMS: Full 14 system review of systems performed and notable only for:  Constitutional: N/A  Eyes: N/A Ear/Nose/Throat: N/A  Skin: N/A  Cardiovascular: N/A  Respiratory: N/A  Gastrointestinal: N/A  Genitourinary: N/A Hematology/Lymphatic: N/A  Endocrine: N/A Musculoskeletal:N/A  Allergy/Immunology: N/A  Neurological: tremors Psychiatric: N/A Sleep: N/A   ALLERGIES: Allergies  Allergen Reactions  . Codeine     REACTION: sick  . Gabapentin     REACTION: hallucinations, dizziness and vomiting    HOME MEDICATIONS: Outpatient Prescriptions Prior to Visit  Medication Sig Dispense Refill  . acetaminophen (TYLENOL) 500 MG tablet Take 500-1,000 mg by mouth every 6 (six) hours as needed for pain.       Marland Kitchen AZILECT 1 MG TABS tablet TAKE 1 TABLET BY MOUTH EVERY DAY  30 tablet  0  .  fenofibrate micronized (LOFIBRA) 200 MG capsule TAKE 1 CAPSULE BY MOUTH EVERY DAY  30 capsule  6  . levothyroxine (SYNTHROID, LEVOTHROID) 112 MCG tablet TAKE 1 TABLET BY MOUTH DAILY  30 tablet  6  . loratadine (CLARITIN) 10 MG tablet Take 10 mg by mouth daily as needed for allergies.       Marland Kitchen nystatin ointment (MYCOSTATIN) Apply 1  application topically 2 (two) times daily.  30 g  3  . oxymetazoline (AFRIN) 0.05 % nasal spray Place 2 sprays into the nose 2 (two) times daily.      . Ropinirole HCl 6 MG TB24 Take by mouth.      . naproxen sodium (ANAPROX) 220 MG tablet Take 440 mg by mouth 2 (two) times daily with a meal.       No facility-administered medications prior to visit.    PAST MEDICAL HISTORY: Past Medical History  Diagnosis Date  . Chronic joint pain   . Hypothyroidism   . Osteopenia   . Parkinson's disease   . Cataract Right eye    PAST SURGICAL HISTORY: Past Surgical History  Procedure Laterality Date  . Abdominal hysterectomy      ovaries remain  . Appendectomy    . Tonsillectomy      FAMILY HISTORY: History reviewed. No pertinent family history.  SOCIAL HISTORY: History   Social History  . Marital Status: Married    Spouse Name: [name]    Number of Children: 3  . Years of Education: College   Occupational History  . Retired    Social History Main Topics  . Smoking status: Former Research scientist (life sciences)  . Smokeless tobacco: Never Used  . Alcohol Use: Yes     Comment: occ.  . Drug Use: No  . Sexual Activity: Not on file   Other Topics Concern  . Not on file   Social History Narrative   Patient lives at home with her husband and has a college education.    Caffeine- None      PHYSICAL EXAM  Filed Vitals:   07/28/14 1142  BP: 121/68  Pulse: 65  Height: 5' 2.5" (1.588 m)  Weight: 164 lb (74.39 kg)   Body mass index is 29.5 kg/(m^2).  Generalized: Well developed, in no acute distress   Neurological examination  Mentation: Alert oriented to time, place, history taking. Follows all commands speech and language fluent Cranial nerve II-XII: Pupils were equal round reactive to light. Extraocular movements were full, visual field were full on confrontational test. Facial sensation and strength were normal.  Head turning and shoulder shrug  were normal and symmetric. Motor: The motor  testing reveals 5 over 5 strength of all 4 extremities. Good symmetric motor tone is noted throughout. Resting tremor noted in the right and left hand.  Sensory: Sensory testing is intact to soft touch on all 4 extremities. No evidence of extinction is noted.  Coordination: Cerebellar testing reveals good finger-nose-finger and heel-to-shin bilaterally.  Gait and station: She is able to stand from a sitting position without assistance. She has good as stride and arm swing. Turns her good. Tandem gait is normal. Romberg is negative. No drift is seen.  Reflexes: Deep tendon reflexes are symmetric and normal bilaterally.    DIAGNOSTIC DATA (LABS, IMAGING, TESTING) - I reviewed patient records, labs, notes, testing and imaging myself where available.  Lab Results  Component Value Date   WBC 6.8 03/19/2014   HGB 12.4 03/19/2014   HCT 37.1 03/19/2014   MCV  85.6 03/19/2014   PLT 231.0 03/19/2014      Component Value Date/Time   NA 140 03/19/2014 0851   K 4.0 03/19/2014 0851   CL 107 03/19/2014 0851   CO2 25 03/19/2014 0851   GLUCOSE 103* 03/19/2014 0851   GLUCOSE 126 09/07/2008   BUN 18 03/19/2014 0851   CREATININE 0.7 03/19/2014 0851   CALCIUM 9.4 03/19/2014 0851   PROT 7.0 03/19/2014 0851   ALBUMIN 4.1 03/19/2014 0851   AST 20 03/19/2014 0851   ALT 24 03/19/2014 0851   ALKPHOS 50 03/19/2014 0851   BILITOT 0.8 03/19/2014 0851   GFRNONAA 88* 01/09/2013 1053   GFRAA >90 01/09/2013 1053   Lab Results  Component Value Date   CHOL 169 03/19/2014   HDL 46.80 03/19/2014   LDLCALC 106* 03/19/2014   LDLDIRECT 98.0 04/10/2013   TRIG 80.0 03/19/2014   CHOLHDL 4 03/19/2014    Lab Results  Component Value Date   TSH 0.63 03/19/2014      ASSESSMENT AND PLAN 73 y.o. year old female  has a past medical history of Chronic joint pain; Hypothyroidism; Osteopenia; Parkinson's disease; and Cataract (Right eye). here with:  1. Parkinson's disease  I will increase the Requip to 8 mg to see if that benefits her tremor.  Continue Azilect.   If hair loss becomes more significant she should let us know.  Try to participate in regular exercise. Follow-up in 6 months or sooner if needed.   Melissa Givens, MSN, NP-C 07/28/2014, 12:01 PM [facility] [address], [place], [place] [address] [phone]  Note: This document was prepared with digital dictation and possible smart phrase technology. Any transcriptional errors that result from this process are unintentional.

## 2014-07-28 NOTE — Patient Instructions (Signed)
Parkinson Disease Parkinson disease is a disorder of the central nervous system, which includes the brain and spinal cord. A person with this disease slowly loses the ability to completely control body movements. Within the brain, there is a group of nerve cells (basal ganglia) that help control movement. The basal ganglia are damaged and do not work properly in a person with Parkinson disease. In addition, the basal ganglia produce and use a brain chemical called dopamine. The dopamine chemical sends messages to other parts of the body to control and coordinate body movements. Dopamine levels are low in a person with Parkinson disease. If the dopamine levels are low, then the body does not receive the correct messages it needs to move normally.  CAUSES  The exact reason why the basal ganglia get damaged is not known. Some medical researchers have thought that infection, genes, environment, and certain medicines may contribute to the cause.  SYMPTOMS   An early symptom of Parkinson disease is often an uncontrolled shaking (tremor) of the hands. The tremor will often disappear when the affected hand is consciously used.  As the disease progresses, walking, talking, getting out of a chair, and new movements become more difficult.  Muscles get stiff and movements become slower.  Balance and coordination become harder.  Depression, trouble swallowing, urinary problems, constipation, and sleep problems can occur.  Later in the disease, memory and thought processes may deteriorate. DIAGNOSIS  There are no specific tests to diagnose Parkinson disease. You may be referred to a neurologist for evaluation. Your caregiver will ask about your medical history, symptoms, and perform a physical exam. Blood tests and imaging tests of your brain may be performed to rule out other diseases. The imaging tests may include an MRI or a CT scan. TREATMENT  The goal of treatment is to relieve symptoms. Medicines may be  prescribed once the symptoms become troublesome. Medicine will not stop the progression of the disease, but medicine can make movement and balance better and help control tremors. Speech and occupational therapy may also be prescribed. Sometimes, surgical treatment of the brain can be done in young people. HOME CARE INSTRUCTIONS  Get regular exercise and rest periods during the day to help prevent exhaustion and depression.  If getting dressed becomes difficult, replace buttons and zippers with Velcro and elastic on your clothing.  Take all medicine as directed by your caregiver.  Install grab bars or railings in your home to prevent falls.  Go to speech or occupational therapy as directed.  Keep all follow-up visits as directed by your caregiver. SEEK MEDICAL CARE IF:  Your symptoms are not controlled with your medicine.  You fall.  You have trouble swallowing or choke on your food. MAKE SURE YOU:  Understand these instructions.  Will watch your condition.  Will get help right away if you are not doing well or get worse. Document Released: 10/26/2000 Document Revised: 02/23/2013 Document Reviewed: 11/28/2011 ExitCare Patient Information 2015 [employer]. This information is not intended to replace advice given to you by your health care provider. Make sure you discuss any questions you have with your health care provider.  

## 2014-08-03 ENCOUNTER — Ambulatory Visit (AMBULATORY_SURGERY_CENTER): Payer: Self-pay

## 2014-08-03 VITALS — Ht 62.5 in | Wt 164.6 lb

## 2014-08-03 DIAGNOSIS — Z1211 Encounter for screening for malignant neoplasm of colon: Secondary | ICD-10-CM

## 2014-08-03 NOTE — Progress Notes (Signed)
Per pt, no allergies to soy or egg products.Pt not taking any weight loss meds or using  O2 at home.   Per pt,some sedation cause nausea and vomiting! 

## 2014-08-12 ENCOUNTER — Ambulatory Visit (AMBULATORY_SURGERY_CENTER): Payer: Medicare Other | Admitting: Internal Medicine

## 2014-08-12 ENCOUNTER — Encounter: Payer: Self-pay | Admitting: Internal Medicine

## 2014-08-12 VITALS — BP 147/106 | HR 122 | Temp 97.3°F | Resp 17 | Ht 62.0 in | Wt 164.0 lb

## 2014-08-12 DIAGNOSIS — D120 Benign neoplasm of cecum: Secondary | ICD-10-CM

## 2014-08-12 DIAGNOSIS — G20 Parkinson's disease: Secondary | ICD-10-CM | POA: Diagnosis not present

## 2014-08-12 DIAGNOSIS — K635 Polyp of colon: Secondary | ICD-10-CM | POA: Diagnosis not present

## 2014-08-12 DIAGNOSIS — Z1211 Encounter for screening for malignant neoplasm of colon: Secondary | ICD-10-CM | POA: Diagnosis not present

## 2014-08-12 DIAGNOSIS — D123 Benign neoplasm of transverse colon: Secondary | ICD-10-CM

## 2014-08-12 MED ORDER — SODIUM CHLORIDE 0.9 % IV SOLN: 500.0000 mL | INTRAVENOUS | Status: DC

## 2014-08-12 NOTE — Progress Notes (Signed)
Patient awakening,vss,report to rn 

## 2014-08-12 NOTE — Patient Instructions (Addendum)
I found and removed 4 polyps that all look benign.  We may need to consider repeating a colonoscopy in 3-5 years.  I will let you know pathology results and when to have another routine colonoscopy by mail.  I appreciate the opportunity to care for you. Gatha Mayer, MD, FACG  YOU HAD AN ENDOSCOPIC PROCEDURE TODAY AT [facility]: Refer to the procedure report that was given to you for any specific questions about what was found during the examination.  If the procedure report does not answer your questions, please call your gastroenterologist to clarify.  If you requested that your care partner not be given the details of your procedure findings, then the procedure report has been included in a sealed envelope for you to review at your convenience later.  YOU SHOULD EXPECT: Some feelings of bloating in the abdomen. Passage of more gas than usual.  Walking can help get rid of the air that was put into your GI tract during the procedure and reduce the bloating. If you had a lower endoscopy (such as a colonoscopy or flexible sigmoidoscopy) you may notice spotting of blood in your stool or on the toilet paper. If you underwent a bowel prep for your procedure, then you may not have a normal bowel movement for a few days.  DIET: Your first meal following the procedure should be a light meal and then it is ok to progress to your normal diet.  A half-sandwich or bowl of soup is an example of a good first meal.  Heavy or fried foods are harder to digest and may make you feel nauseous or bloated.  Likewise meals heavy in dairy and vegetables can cause extra gas to form and this can also increase the bloating.  Drink plenty of fluids but you should avoid alcoholic beverages for 24 hours.  ACTIVITY: Your care partner should take you home directly after the procedure.  You should plan to take it easy, moving slowly for the rest of the day.  You can resume normal activity the day after the  procedure however you should NOT DRIVE or use heavy machinery for 24 hours (because of the sedation medicines used during the test).    SYMPTOMS TO REPORT IMMEDIATELY: A gastroenterologist can be reached at any hour.  During normal business hours, 8:30 AM to 5:00 PM Monday through Friday, call [phone].  After hours and on weekends, please call the GI answering service at [phone] who will take a message and have the physician on call contact you.   Following lower endoscopy (colonoscopy or flexible sigmoidoscopy):  Excessive amounts of blood in the stool  Significant tenderness or worsening of abdominal pains  Swelling of the abdomen that is new, acute  Fever of 100F or higher  FOLLOW UP: If any biopsies were taken you will be contacted by phone or by letter within the next 1-3 weeks.  Call your gastroenterologist if you have not heard about the biopsies in 3 weeks.  Our staff will call the home number listed on your records the next business day following your procedure to check on you and address any questions or concerns that you may have at that time regarding the information given to you following your procedure. This is a courtesy call and so if there is no answer at the home number and we have not heard from you through the emergency physician on call, we will assume that you have returned to your regular  daily activities without incident.  SIGNATURES/CONFIDENTIALITY: You and/or your care partner have signed paperwork which will be entered into your electronic medical record.  These signatures attest to the fact that that the information above on your After Visit Summary has been reviewed and is understood.  Full responsibility of the confidentiality of this discharge information lies with you and/or your care-partner.  Polyps-handout given  Repeat colonoscopy will be determined by pathology.

## 2014-08-12 NOTE — Progress Notes (Signed)
Called to room to assist during endoscopic procedure.  Patient ID and intended procedure confirmed with present staff. Received instructions for my participation in the procedure from the performing physician.  

## 2014-08-12 NOTE — Op Note (Signed)
[facility]  [address] [place], [address]   COLONOSCOPY PROCEDURE REPORT  PATIENT: Melissa, Douglas  MR#: [id number] BIRTHDATE: [birth year] , 73  yrs. old GENDER: female ENDOSCOPIST: Gatha Mayer, MD, [facility] PROCEDURE DATE:  08/12/2014 PROCEDURE:   Colonoscopy with snare polypectomy First Screening Colonoscopy - Avg.  risk and is 50 yrs.  old or older Yes.  Prior Negative Screening - Now for repeat screening. N/A  History of Adenoma - Now for follow-up colonoscopy & has been > or = to 3 yrs.  N/A ASA CLASS:   Class II INDICATIONS:average risk for colorectal cancer and first colonoscopy. MEDICATIONS: Propofol 350 mg IV and Monitored anesthesia care  DESCRIPTION OF PROCEDURE:   After the risks benefits and alternatives of the procedure were thoroughly explained, informed consent was obtained.  The digital rectal exam revealed no abnormalities of the rectum.   The LB [id number]  endoscope was introduced through the anus and advanced to the cecum, which was identified by both the appendix and ileocecal valve. No adverse events experienced.   The quality of the prep was excellent, using MiraLax  The instrument was then slowly withdrawn as the colon was fully examined.   COLON FINDINGS: A sessile polyp measuring 10 mm in size was found at the cecum.  A polypectomy was performed in a piecemeal fashion with a cold snare.  The resection was complete, the polyp tissue was completely retrieved and sent to histology.   Three sessile polyps ranging from 3 to 50mm in size were found in the transverse colon. A polypectomy was performed with a cold snare.  The resection was complete, the polyp tissue was completely retrieved and sent to histology.   The examination was otherwise normal.  Retroflexed views revealed no abnormalities. The time to cecum=5 minutes 40 seconds.  Withdrawal time=15 minutes 16 seconds.  The scope was withdrawn and the procedure  completed. COMPLICATIONS: There were no immediate complications.  ENDOSCOPIC IMPRESSION: 1.   Sessile polyp (91mm) was found at the cecum; polypectomy was performed in a piecemeal fashion with a cold snare 2.   Three sessile polyps ranging from 3 to 86mm in size were found in the transverse colon; polypectomy was performed with a cold snare 3.   The examination was otherwise normal  RECOMMENDATIONS: Timing of repeat colonoscopy will be determined by pathology findings.  eSigned:  Gatha Mayer, MD, [facility] 08/12/2014 11:44 AM   cc: The Patient and Dimple Nanas, MD

## 2014-08-13 ENCOUNTER — Telehealth: Payer: Self-pay | Admitting: *Deleted

## 2014-08-13 NOTE — Telephone Encounter (Signed)
  Follow up Call-  Call back number 08/12/2014  Post procedure Call Back phone  # [phone]  Permission to leave phone message Yes     Patient questions:  Do you have a fever, pain , or abdominal swelling? No. Pain Score  0 *  Have you tolerated food without any problems? Yes.    Have you been able to return to your normal activities? Yes.    Do you have any questions about your discharge instructions: Diet   No. Medications  No. Follow up visit  No.  Do you have questions or concerns about your Care? No.  Actions: * If pain score is 4 or above: No action needed, pain <4.

## 2014-08-16 ENCOUNTER — Other Ambulatory Visit: Payer: Self-pay | Admitting: Neurology

## 2014-08-16 NOTE — Telephone Encounter (Signed)
Dose was changed to 8mg  at last OV in Sept

## 2014-08-18 ENCOUNTER — Encounter: Payer: Self-pay | Admitting: Internal Medicine

## 2014-08-18 DIAGNOSIS — Z860101 Personal history of adenomatous and serrated colon polyps: Secondary | ICD-10-CM | POA: Insufficient documentation

## 2014-08-18 DIAGNOSIS — Z86010 Personal history of colonic polyps: Secondary | ICD-10-CM

## 2014-08-18 HISTORY — DX: Personal history of colonic polyps: Z86.010

## 2014-08-18 HISTORY — DX: Personal history of adenomatous and serrated colon polyps: Z86.0101

## 2014-08-18 NOTE — Progress Notes (Signed)
Quick Note:  3 adenomas max 10 mm repeat colon 2018 ______

## 2014-08-26 ENCOUNTER — Telehealth: Payer: Self-pay | Admitting: Internal Medicine

## 2014-08-26 NOTE — Telephone Encounter (Signed)
Letter faxed.  Confirmation recieved

## 2014-09-22 DIAGNOSIS — Z23 Encounter for immunization: Secondary | ICD-10-CM | POA: Diagnosis not present

## 2014-09-29 ENCOUNTER — Encounter: Payer: Self-pay | Admitting: Neurology

## 2014-10-05 ENCOUNTER — Encounter: Payer: Self-pay | Admitting: Neurology

## 2014-12-13 ENCOUNTER — Ambulatory Visit (INDEPENDENT_AMBULATORY_CARE_PROVIDER_SITE_OTHER): Payer: Medicare Other | Admitting: Family Medicine

## 2014-12-13 ENCOUNTER — Encounter: Payer: Self-pay | Admitting: Family Medicine

## 2014-12-13 VITALS — BP 122/76 | HR 100 | Temp 97.9°F | Resp 16 | Wt 163.1 lb

## 2014-12-13 DIAGNOSIS — R234 Changes in skin texture: Secondary | ICD-10-CM | POA: Diagnosis not present

## 2014-12-13 DIAGNOSIS — R239 Unspecified skin changes: Secondary | ICD-10-CM

## 2014-12-13 DIAGNOSIS — M7989 Other specified soft tissue disorders: Secondary | ICD-10-CM

## 2014-12-13 DIAGNOSIS — R413 Other amnesia: Secondary | ICD-10-CM | POA: Diagnosis not present

## 2014-12-13 DIAGNOSIS — M799 Soft tissue disorder, unspecified: Secondary | ICD-10-CM | POA: Diagnosis not present

## 2014-12-13 DIAGNOSIS — G20 Parkinson's disease: Secondary | ICD-10-CM | POA: Diagnosis not present

## 2014-12-13 DIAGNOSIS — E038 Other specified hypothyroidism: Secondary | ICD-10-CM

## 2014-12-13 MED ORDER — BENZONATATE 200 MG PO CAPS: 200.0000 mg | ORAL_CAPSULE | Freq: Three times a day (TID) | ORAL | Status: DC | PRN

## 2014-12-13 NOTE — Progress Notes (Signed)
Pre visit review using our clinic review tool, if applicable. No additional management support is needed unless otherwise documented below in the visit note. 

## 2014-12-13 NOTE — Progress Notes (Signed)
   Subjective:    Patient ID: Melissa Douglas, female    DOB: 09-26-1941, 74 y.o.   MRN: [id number]  HPI Skin changes- hyperpigmented areas on each cheek, 'changing almost daily'.  One on R cheek is 'crusty'.  Has 'lump on back' that is enlarging and now 'itchy'.    Cognitive difficulties- pt reports 'i can't keep a thought in my head'.  Trouble w/ memory, 'i don't trust myself w/ the car'- difficulty operating the vehicle, knowing where she's going.  No trouble recognizing those close to her.  Difficulty w/ word recall/finding.  Pt still aware of objects and their functions.  'and then it comes back'- will nudge husband later in the day/night.  Pt reports 'i see things that aren't there'- people, 'children that were in my life.  Then they go away'.   Review of Systems For ROS see HPI   Reviewed meds, allergies, problem list, and PMH in chart     Objective:   Physical Exam  Constitutional: She is oriented to person, place, and time. She appears well-developed and well-nourished. No distress.  HENT:  Head: Normocephalic and atraumatic.  Eyes: Conjunctivae and EOM are normal. Pupils are equal, round, and reactive to light.  Neck: Normal range of motion. Neck supple. No thyromegaly present.  Cardiovascular: Normal rate, regular rhythm, normal heart sounds and intact distal pulses.   No murmur heard. Pulmonary/Chest: Effort normal and breath sounds normal. No respiratory distress.  Abdominal: Soft. She exhibits no distension. There is no tenderness.  Musculoskeletal: She exhibits no edema.  Lymphadenopathy:    She has no cervical adenopathy.  Neurological: She is alert and oriented to person, place, and time.  Skin: Skin is warm and dry.  5 cm soft tissue mass on R mid back, just above bra line 2 hyperpigmented macules on face  Psychiatric: She has a normal mood and affect. Her behavior is normal.  Vitals reviewed.         Assessment & Plan:

## 2014-12-13 NOTE — Patient Instructions (Signed)
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

## 2014-12-13 NOTE — Assessment & Plan Note (Signed)
Chronic problem.  Has not had labs checked recently and this may be the cause of some of her cognitive impairment.  Adjust meds prn.

## 2014-12-13 NOTE — Assessment & Plan Note (Signed)
New.  Pt has known parkinson's and now memory loss and possible visual hallucinations.  Check labs to r/o underlying metabolic issues.  Re-establish w/ neuro in case this is progression of Parkinson's vs other neurologic process vs side effect of medication.  Will follow closely.

## 2014-12-14 LAB — CBC WITH DIFFERENTIAL/PLATELET
Basophils Absolute: 0 10*3/uL (ref 0.0–0.1)
Basophils Relative: 0.4 % (ref 0.0–3.0)
EOS PCT: 1.1 % (ref 0.0–5.0)
Eosinophils Absolute: 0.1 10*3/uL (ref 0.0–0.7)
HEMATOCRIT: 37.2 % (ref 36.0–46.0)
HEMOGLOBIN: 12.4 g/dL (ref 12.0–15.0)
Lymphocytes Relative: 27.7 % (ref 12.0–46.0)
Lymphs Abs: 1.8 10*3/uL (ref 0.7–4.0)
MCHC: 33.5 g/dL (ref 30.0–36.0)
MCV: 83.6 fl (ref 78.0–100.0)
MONOS PCT: 7.9 % (ref 3.0–12.0)
Monocytes Absolute: 0.5 10*3/uL (ref 0.1–1.0)
NEUTROS PCT: 62.9 % (ref 43.0–77.0)
Neutro Abs: 4.1 10*3/uL (ref 1.4–7.7)
PLATELETS: 254 10*3/uL (ref 150.0–400.0)
RBC: 4.45 Mil/uL (ref 3.87–5.11)
RDW: 13.6 % (ref 11.5–15.5)
WBC: 6.5 10*3/uL (ref 4.0–10.5)

## 2014-12-14 LAB — B12 AND FOLATE PANEL
FOLATE: 10.9 ng/mL (ref >5.9)
VITAMIN B 12: 294 pg/mL (ref 211–911)

## 2014-12-14 LAB — HEPATIC FUNCTION PANEL
ALK PHOS: 53 U/L (ref 39–117)
ALT: 18 U/L (ref 0–35)
AST: 15 U/L (ref 0–37)
Albumin: 4.4 g/dL (ref 3.5–5.2)
BILIRUBIN DIRECT: 0 mg/dL (ref 0.0–0.3)
BILIRUBIN TOTAL: 0.3 mg/dL (ref 0.2–1.2)
Total Protein: 7 g/dL (ref 6.0–8.3)

## 2014-12-14 LAB — BASIC METABOLIC PANEL
BUN: 21 mg/dL (ref 6–23)
CO2: 28 mEq/L (ref 19–32)
CREATININE: 0.65 mg/dL (ref 0.40–1.20)
Calcium: 9.6 mg/dL (ref 8.4–10.5)
Chloride: 105 mEq/L (ref 96–112)
GFR: 94.61 mL/min (ref >60.00)
GLUCOSE: 98 mg/dL (ref 70–99)
Potassium: 4.1 mEq/L (ref 3.5–5.1)
SODIUM: 138 meq/L (ref 135–145)

## 2014-12-14 LAB — TSH: TSH: 0.79 u[IU]/mL (ref 0.35–4.50)

## 2014-12-14 NOTE — Assessment & Plan Note (Signed)
New.  Due to changing papules on face, will refer to derm for complete evaluation and possible tx.  Pt expressed understanding and is in agreement w/ plan.

## 2014-12-14 NOTE — Assessment & Plan Note (Signed)
New.  Pt w/ large soft tissue mass on R lateral back.  No TTP, no overlying skin changes.  Suspect lipoma but will get [place] to assess.  Pt expressed understanding and is in agreement w/ plan.

## 2014-12-14 NOTE — Assessment & Plan Note (Signed)
Deteriorated.  Pt w/ obvious bilateral hand tremor in office today.  Now having memory/cognitive issues and visual hallucinations.  Unclear if this is due to underlying Parkinson's or the meds being used to tx her.  Stressed need for neuro f/u given new sxs.  Pt expressed understanding and is in agreement w/ plan.

## 2014-12-16 ENCOUNTER — Encounter: Payer: Self-pay | Admitting: General Practice

## 2014-12-17 ENCOUNTER — Other Ambulatory Visit: Payer: Self-pay | Admitting: Family Medicine

## 2014-12-17 ENCOUNTER — Other Ambulatory Visit: Payer: Medicare Other

## 2014-12-17 NOTE — Telephone Encounter (Signed)
Med filled.  

## 2014-12-20 ENCOUNTER — Ambulatory Visit
Admission: RE | Admit: 2014-12-20 | Discharge: 2014-12-20 | Disposition: A | Discharge status: Home / Self Care | Payer: Medicare Other | Source: Ambulatory Visit | Attending: Family Medicine | Admitting: Family Medicine

## 2014-12-20 DIAGNOSIS — M7989 Other specified soft tissue disorders: Secondary | ICD-10-CM

## 2014-12-20 DIAGNOSIS — R918 Other nonspecific abnormal finding of lung field: Secondary | ICD-10-CM | POA: Diagnosis not present

## 2014-12-21 ENCOUNTER — Other Ambulatory Visit: Payer: Self-pay | Admitting: Family Medicine

## 2014-12-21 DIAGNOSIS — M7989 Other specified soft tissue disorders: Secondary | ICD-10-CM

## 2014-12-22 ENCOUNTER — Telehealth: Payer: Self-pay | Admitting: Family Medicine

## 2014-12-22 NOTE — Telephone Encounter (Signed)
error:[id number] ° °

## 2014-12-23 ENCOUNTER — Other Ambulatory Visit: Payer: Self-pay | Admitting: General Practice

## 2014-12-23 MED ORDER — PREDNISONE 50 MG PO TABS: ORAL_TABLET | ORAL | Status: DC

## 2014-12-23 MED ORDER — DIPHENHYDRAMINE HCL 50 MG PO TABS: 50.0000 mg | ORAL_TABLET | Freq: Every evening | ORAL | Status: DC | PRN

## 2014-12-25 ENCOUNTER — Ambulatory Visit (HOSPITAL_BASED_OUTPATIENT_CLINIC_OR_DEPARTMENT_OTHER)
Admission: RE | Admit: 2014-12-25 | Discharge: 2014-12-25 | Disposition: A | Discharge status: Home / Self Care | Payer: Medicare Other | Source: Ambulatory Visit | Attending: Family Medicine | Admitting: Family Medicine

## 2014-12-25 DIAGNOSIS — D171 Benign lipomatous neoplasm of skin and subcutaneous tissue of trunk: Secondary | ICD-10-CM | POA: Insufficient documentation

## 2014-12-25 DIAGNOSIS — M7989 Other specified soft tissue disorders: Secondary | ICD-10-CM

## 2014-12-25 DIAGNOSIS — R222 Localized swelling, mass and lump, trunk: Secondary | ICD-10-CM | POA: Diagnosis present

## 2014-12-25 MED ORDER — GADOBENATE DIMEGLUMINE 529 MG/ML IV SOLN: 15.0000 mL/kg | Freq: Once | INTRAVENOUS | Status: DC

## 2014-12-27 ENCOUNTER — Institutional Professional Consult (permissible substitution): Payer: Medicare Other | Admitting: Neurology

## 2014-12-29 ENCOUNTER — Telehealth: Payer: Self-pay | Admitting: Family Medicine

## 2014-12-29 NOTE — Telephone Encounter (Signed)
[facility] - Client [facility] Patient Name: Melissa Douglas DOB: Aug 02, 1941 Initial Comment Caller states she is having bright flashes of light in vision Nurse Assessment Nurse: Mechele Dawley, RN, Amy Date/Time (Eastern Time): 12/29/2014 10:59:52 AM Confirm and document reason for call. If symptomatic, describe symptoms. ---CATARACT SURGERY IN RIGHT EYE - MID SUMMER. SHE IS HAVING BRIGHT FLASHES OF LIGHT IN THIS EYE. THIS STARTED A MONTH AGO. SHE STATES THEN THEY STOPPED. SHE DID NOT MENTION THIS TO ANYONE AS SHE DID NOT KNOW IT WAS SOMETHING TO BE CONCERNED ABOUT. SHE STATES IT STARTED BACK UP AND ONLY HAPPENS IN THE RIGHT EYE. SHE SAYS ITS JUST A BLINK. IT WILL HAPPEN A COUPLE OF TIMES A DAY AND THEN WILL STOP. SHE HAS BEEN HAVING SOME HEADACHES. SHE IS DX WITH PARKINSON'S AND FEELS THAT IS PART OF THE CONDITION. SHE HAS CONCERNS ABOUT THE MD THAT DID THE PROCEDURE. SHE FEELS THAT SHE WANTS ANOTHER OPINION WITH THE EYES. SHE STATES THAT SHE NEEDS SURGERY IN THE OTHER EYE AS WELL. NO EAR PAIN, NO CHEEK BONE PAIN. Has the patient traveled out of the country within the last 30 days? ---Not Applicable Does the patient require triage? ---Yes Related visit to physician within the last 2 weeks? ---Yes Does the PT have any chronic conditions? (i.e. diabetes, asthma, etc.) ---Yes List chronic conditions. ---PARKINSON'S DISEASE Guidelines Guideline Title Affirmed Question Affirmed Notes Vision Loss or Change Flashes of light (Exception: transient from pressing on the eyeball) Final Disposition User See Physician within 4 Hours (or PCP triage) [place], RN, Amy Comments CALLER STATES THAT SHE DOES NOT WANT TO HAVE AN APPT WITH DR. TABORI. SHE HAD TOLD TRIAGE NURSE THAT SHE NEEDED REFERRAL TO GO TO EYE DOCTOR AND THAT SHE WANTED TO GO TO ANOTHER EYE DOCTOR FOR A 2ND OPINION. SHE STATES THAT IS WHAT SHE WANTS A SECOND  OPINION. INSTRUCTED HER THAT IF SHE DECIDES THAT SHE WANTS AN APPT FOR DR. TABORI TO SUGGEST A EYE DOCTOR FOR A SECOND OPINION AND TO TALK WITH HER ABOUT HER PLEASE NOTE: All timestamps contained within this report are represented as [place] Standard Time. CONFIDENTIALTY NOTICE: This fax transmission is intended only for the addressee. It contains information that is legally privileged, confidential or otherwise protected from use or disclosure. If you are not the intended recipient, you are strictly prohibited from reviewing, disclosing, copying using or disseminating any of this information or taking any action in reliance on or regarding this information. If you have received this fax in error, please notify us immediately by telephone so that we can arrange for its return to [place]. Phone: [phone], Toll-Free: [phone], Fax: [phone] Page: 2 of 2 Call Id: [id number] Comments SYMPTOMS TO PLEASE GIVE [place] A CALL BACK AND WE WILL ASSIST HER IN DOING SO. INSTRUCTED HER TO CALL THE EYE DOCTOR OF CHOICE IF SHE KNOWS WHO SHE WANTS TO SEE AND FIND OUT IF THEY NEED THE REFERRAL AND SHE WILL KNOW HOW SHE NEEDS TO PROCEED. WILL CLOSE THIS CALL.

## 2014-12-29 NOTE — Telephone Encounter (Signed)
Please advise 

## 2014-12-29 NOTE — Telephone Encounter (Signed)
If pt is seeing bright flashes of light in visual field, I would recommend possibly going to ER for evaluation as this is concerning.  I pt refuses ER evaluation, we can put in stat referral to ophthalmology for visual changes R eye

## 2014-12-29 NOTE — Telephone Encounter (Addendum)
Pt declined the ER and plans to go back to Dr. Katy Fitch (the doctor who performed the cataract surgery ) on Monday or Tuesday next week for follow up appointment.  Pt was encouraged to call back if anything changes.  She stated understanding and agreed.

## 2015-01-03 ENCOUNTER — Encounter: Payer: Self-pay | Admitting: Neurology

## 2015-01-03 ENCOUNTER — Ambulatory Visit (INDEPENDENT_AMBULATORY_CARE_PROVIDER_SITE_OTHER): Payer: Medicare Other | Admitting: Neurology

## 2015-01-03 VITALS — BP 126/80 | HR 72 | Ht 62.0 in | Wt 164.0 lb

## 2015-01-03 DIAGNOSIS — G20 Parkinson's disease: Secondary | ICD-10-CM | POA: Diagnosis not present

## 2015-01-03 MED ORDER — ROPINIROLE HCL ER 2 MG PO TB24: ORAL_TABLET | ORAL | Status: DC

## 2015-01-03 NOTE — Progress Notes (Signed)
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  Since 2011, she noticed right hand resting tremor,  also noticed mild left hand tremor, she denies gait difficulty, she has decreased his sense of smell, she has excessive wild dreams, talking out of her dreams, occasionally constipation, no orthostatic symptoms. 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  She complained of right buttock area pain, shooting pain along right posterior thigh in 2013, no weakness,getting better when standing up, which has improved now. She denies gait difficulty.  UPDATE Feb 22nd 2016: She is now complaining worsening parkinsonian features, has increased left hand tremor, affecting her painting, also has mild shuffling gait, continues to complains of low back pain, she had a cataract surgery, since January 2016, she complains of episode of seeing flashing from her right eye, she also developed visual hallucinations, seeing her granddaughter sitting by her side watching  her paint,    REVIEW OF SYSTEMS: Full 14 system review of systems performed and notable only for: Weight gain, hearing loss, itching, blurry vision, easy bruising, memory loss, hallucinations, tremor    ALLERGIES: Allergies  Allergen Reactions  . Codeine     REACTION: sick  . Ether     VERY DIZZY  . Gabapentin     REACTION: hallucinations, dizziness and vomiting  . Iodine     HIVES    HOME MEDICATIONS: Outpatient Prescriptions Prior to Visit  Medication Sig Dispense Refill  . acetaminophen (TYLENOL) 500 MG tablet Take 500-1,000 mg by mouth every 6 (six) hours as needed for pain.     Marland Kitchen AZILECT 1 MG TABS tablet TAKE 1 TABLET BY MOUTH EVERY DAY 30 tablet 6  . fenofibrate micronized (LOFIBRA) 200 MG capsule TAKE 1 CAPSULE BY MOUTH EVERY DAY 30 capsule 6  . levothyroxine (SYNTHROID, LEVOTHROID) 112 MCG tablet TAKE 1 TABLET BY MOUTH EVERY DAY 30 tablet 3  . rOPINIRole (REQUIP XL) 8 MG 24 hr tablet Take 8 mg by mouth every evening.    . benzonatate (TESSALON) 200 MG capsule Take 1 capsule (200 mg total) by mouth 3 (three) times daily as needed for cough. 60 capsule 0  . diphenhydrAMINE (BENADRYL) 50 MG tablet Take 1 tablet (50 mg total) by mouth at bedtime as needed for itching. Please take 1 hour before study. 1 tablet 0  . loratadine (CLARITIN) 10 MG tablet Take 10 mg by mouth daily as needed for allergies.     Marland Kitchen nystatin ointment (MYCOSTATIN) Apply 1 application topically as needed.    . predniSONE (DELTASONE) 50 MG tablet Take by  mouth at 13 hours, 7 hours, and 1 hour prior to study. 3 tablet 0   No facility-administered medications prior to visit.    PAST MEDICAL HISTORY: Past Medical History  Diagnosis Date  . Chronic joint pain   . Hypothyroidism   . Osteopenia   . Parkinson's disease   . Cataract Right eye    HAD SURGERY  . Multiple nasal polyps   . Hx of adenomatous colonic polyps 08/18/2014  . Parkinson disease     PAST SURGICAL HISTORY: Past Surgical History    Procedure Laterality Date  . Abdominal hysterectomy      ovaries remain  . Appendectomy    . Tonsillectomy    . Cataract extraction      FAMILY HISTORY: No family history on file.  SOCIAL HISTORY: History   Social History  . Marital Status: Married    Spouse Name: [name]  . Number of Children: 3  . Years of Education: College   Occupational History  . Retired    Social History Main Topics  . Smoking status: Former Smoker    Types: Cigarettes    Quit date: 01/01/2001  . Smokeless tobacco: Never Used  . Alcohol Use: 7.2 - 8.4 oz/week    12-14 Glasses of wine per week     Comment: occ.  . Drug Use: No  . Sexual Activity: Not on file   Other Topics Concern  . Not on file   Social History Narrative   Patient lives at home with her husband and has a college education.    Caffeine- occasional use   Left-handed.      PHYSICAL EXAM  Filed Vitals:   01/03/15 0904  BP: 126/80  Pulse: 72  Height: 5\' 2"  (1.575 m)  Weight: 164 lb (74.39 kg)   Body mass index is 29.99 kg/(m^2).  Generalized: Well developed, in no acute distress   Neurological examination  Mentation: Alert oriented to time, place, history taking. Follows all commands speech and language fluent, Mild mask face  Cranial nerve II-XII: Pupils were equal round reactive to light. Extraocular movements were full, visual field were full on confrontational test. Facial sensation and strength were normal.  Head turning and shoulder shrug  were normal and symmetric. Motor:  she has bilateral hands resting tremor, mild limb, and nuchal rigidity, right worse than left,. No weakness  Sensory: Sensory testing is intact to soft touch on all 4 extremities. No evidence of extinction is noted.  Coordination: Cerebellar testing reveals good finger-nose-finger and heel-to-shin bilaterally.  Gait and station: She is able to stand from a sitting position without assistance. She has good as stride and arm swing. Turns her  good. Tandem gait is normal. Romberg is negative. No drift is seen.  Reflexes: Deep tendon reflexes are symmetric and normal bilaterally.    DIAGNOSTIC DATA (LABS, IMAGING, TESTING) - I reviewed patient records, labs, notes, testing and imaging myself where available.  Lab Results  Component Value Date   WBC 6.5 12/13/2014   HGB 12.4 12/13/2014   HCT 37.2 12/13/2014   MCV 83.6 12/13/2014   PLT 254.0 12/13/2014      Component Value Date/Time   NA 138 12/13/2014 1515   K 4.1 12/13/2014 1515   CL 105 12/13/2014 1515   CO2 28 12/13/2014 1515   GLUCOSE 98 12/13/2014 1515   GLUCOSE 126 09/07/2008   BUN 21 12/13/2014 1515   CREATININE 0.65 12/13/2014 1515   CALCIUM 9.6 12/13/2014 1515   PROT 7.0  12/13/2014 1515   ALBUMIN 4.4 12/13/2014 1515   AST 15 12/13/2014 1515   ALT 18 12/13/2014 1515   ALKPHOS 53 12/13/2014 1515   BILITOT 0.3 12/13/2014 1515   GFRNONAA 88* 01/09/2013 1053   GFRAA >90 01/09/2013 1053   Lab Results  Component Value Date   CHOL 169 03/19/2014   HDL 46.80 03/19/2014   LDLCALC 106* 03/19/2014   LDLDIRECT 98.0 04/10/2013   TRIG 80.0 03/19/2014   CHOLHDL 4 03/19/2014    Lab Results  Component Value Date   TSH 0.79 12/13/2014      ASSESSMENT AND PLAN 74 y.o. year old female with Parkinson's disease, mild memory trouble, visual hallucinations since 2016 worsening bilateral hands tremor,  Continue Azilect.  Decreased Requip xr to 2 tablets 3 times a day,  Continue moderate exercise,   may consider add on Sinemet at next visit  Marcial Pacas, M.D. Ph.D.  [facility] [place], [place] [address] Phone: [phone] Fax:      [phone]

## 2015-01-04 DIAGNOSIS — H2512 Age-related nuclear cataract, left eye: Secondary | ICD-10-CM | POA: Diagnosis not present

## 2015-01-04 DIAGNOSIS — Z961 Presence of intraocular lens: Secondary | ICD-10-CM | POA: Diagnosis not present

## 2015-01-17 ENCOUNTER — Other Ambulatory Visit: Payer: Self-pay | Admitting: Adult Health

## 2015-01-18 DIAGNOSIS — H2512 Age-related nuclear cataract, left eye: Secondary | ICD-10-CM | POA: Diagnosis not present

## 2015-01-19 NOTE — Telephone Encounter (Signed)
The clinic was supposed to be doing refills, however this one did not get completed by them.  Last OV note says pt was decreased to 2mg  three daily

## 2015-01-24 DIAGNOSIS — H2512 Age-related nuclear cataract, left eye: Secondary | ICD-10-CM | POA: Diagnosis not present

## 2015-01-26 ENCOUNTER — Ambulatory Visit: Payer: Medicare Other | Admitting: Adult Health

## 2015-02-01 ENCOUNTER — Ambulatory Visit: Payer: Medicare Other | Admitting: Adult Health

## 2015-03-09 ENCOUNTER — Ambulatory Visit: Payer: Medicare Other | Admitting: Neurology

## 2015-03-13 ENCOUNTER — Other Ambulatory Visit: Payer: Self-pay | Admitting: Neurology

## 2015-03-17 ENCOUNTER — Encounter: Payer: Self-pay | Admitting: Neurology

## 2015-03-17 ENCOUNTER — Ambulatory Visit (INDEPENDENT_AMBULATORY_CARE_PROVIDER_SITE_OTHER): Payer: Medicare Other | Admitting: Neurology

## 2015-03-17 VITALS — BP 141/65 | HR 67 | Ht 62.0 in | Wt 164.0 lb

## 2015-03-17 DIAGNOSIS — E781 Pure hyperglyceridemia: Secondary | ICD-10-CM | POA: Diagnosis not present

## 2015-03-17 DIAGNOSIS — G20 Parkinson's disease: Secondary | ICD-10-CM

## 2015-03-17 DIAGNOSIS — G20A1 Parkinson's disease without dyskinesia, without mention of fluctuations: Secondary | ICD-10-CM

## 2015-03-17 MED ORDER — CARBIDOPA-LEVODOPA 25-100 MG PO TABS: 1.0000 | ORAL_TABLET | Freq: Three times a day (TID) | ORAL | Status: DC

## 2015-03-17 MED ORDER — ROPINIROLE HCL ER 2 MG PO TB24: ORAL_TABLET | ORAL | Status: DC

## 2015-03-17 NOTE — Progress Notes (Signed)
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  Since 2011, she noticed right hand resting tremor,  also noticed mild left hand tremor, she denies gait difficulty, she has decreased his sense of smell, she has excessive wild dreams, talking out of her dreams, occasionally constipation, no orthostatic symptoms. 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  She complained of right buttock area pain, shooting pain along right posterior thigh in 2013, no weakness,getting better when standing up, which has improved now. She denies gait difficulty.  UPDATE Feb 22nd 2016: She is now complaining worsening parkinsonian features,visual hallucinations.  UPDATE May 5th 2016: She is on lower dose of requip xr 2mg  3 tabs qhs, visual hallucination has almost disappeared, Azilect 1mg  qday, still enjoys painting,  She does not exercise much, complains of stiff knee, lightheadedness when getting up, she has no significant change of her memory, forget where she put  things, no hallucination,   REVIEW OF SYSTEMS: Full 14 system review of systems performed and notable only for: Weight gain, hearing loss, itching, blurry vision, easy bruising, memory loss, hallucinations, tremor    ALLERGIES: Allergies  Allergen Reactions  . Codeine     REACTION: sick  . Ether     VERY DIZZY  . Gabapentin     REACTION: hallucinations, dizziness and vomiting  . Iodine     HIVES    HOME MEDICATIONS: Outpatient Prescriptions Prior to Visit  Medication Sig Dispense Refill  . acetaminophen (TYLENOL) 500 MG tablet Take 500-1,000 mg by mouth every 6 (six) hours as needed for pain.     Marland Kitchen AZILECT 1 MG TABS tablet TAKE 1 TABLET BY MOUTH EVERY DAY 30 tablet 6  . levothyroxine (SYNTHROID, LEVOTHROID) 112 MCG tablet TAKE 1 TABLET BY MOUTH EVERY DAY 30 tablet 3  . rOPINIRole (REQUIP XL) 2 MG 24 hr tablet 3 tabs po qhs 90 tablet 6  . rOPINIRole (REQUIP XL) 8 MG 24 hr tablet Take 8 mg by mouth every evening.     No facility-administered medications prior to visit.    PAST MEDICAL HISTORY: Past Medical History  Diagnosis Date  . Chronic joint pain   . Hypothyroidism   . Osteopenia   . Parkinson's disease   . Cataract Right eye    HAD SURGERY  . Multiple nasal polyps   . Hx of adenomatous colonic polyps 08/18/2014  . Parkinson disease     PAST SURGICAL HISTORY: Past Surgical History  Procedure Laterality Date  . Abdominal hysterectomy      ovaries remain  . Appendectomy    .  Tonsillectomy    . Cataract extraction      FAMILY HISTORY: No family history on file.  SOCIAL HISTORY: History   Social History  . Marital Status: Married    Spouse Name: [name]  . Number of Children: 3  . Years of Education: College   Occupational History  . Retired    Social History Main Topics  . Smoking status: Former Smoker    Types: Cigarettes    Quit date: 01/01/2001  . Smokeless tobacco: Never Used  . Alcohol Use: 7.2 - 8.4 oz/week    12-14 Glasses of wine per week      Comment: occ.  . Drug Use: No  . Sexual Activity: Not on file   Other Topics Concern  . Not on file   Social History Narrative   Patient lives at home with her husband and has a college education.    Caffeine- occasional use   Left-handed.      PHYSICAL EXAM  Filed Vitals:   03/17/15 1043  BP: 141/65  Pulse: 67  Height: 5\' 2"  (1.575 m)  Weight: 164 lb (74.39 kg)   Body mass index is 29.99 kg/(m^2).  PHYSICAL EXAMNIATION:  Gen: NAD, conversant, well nourised, obese, well groomed                     Cardiovascular: Regular rate rhythm, no peripheral edema, warm, nontender. Eyes: Conjunctivae clear without exudates or hemorrhage Neck: Supple, no carotid bruise. Pulmonary: Clear to auscultation bilaterally   NEUROLOGICAL EXAM:  MENTAL STATUS: Speech:    Speech is normal; fluent and spontaneous with normal comprehension.  Cognition: Mini-Mental Status Examination is 28 out of 30, she is not oriented to date, season,  CRANIAL NERVES: CN II: Visual fields are full to confrontation. Fundoscopic exam is normal with sharp discs and no vascular changes. Venous pulsations are present bilaterally. Pupils are 4 mm and briskly reactive to light. Visual acuity is 20/20 bilaterally. CN III, IV, VI: extraocular movement are normal. No ptosis. CN V: Facial sensation is intact to pinprick in all 3 divisions bilaterally. Corneal responses are intact.  CN VII: Face is symmetric with normal eye closure and smile. CN VIII: Hearing is normal to rubbing fingers CN IX, X: Palate elevates symmetrically. Phonation is normal. CN XI: Head turning and shoulder shrug are intact CN XII: Tongue is midline with normal movements and no atrophy.  MOTOR: She has normal bulk, and strength, moderate bilateral limb, nuchal rigidity, bilateral hands resting tremor, bradykinesia,  REFLEXES: Reflexes are 2+ and symmetric at the biceps, triceps, knees, and ankles. Plantar responses are  flexor.  SENSORY: Light touch, pinprick, position sense, and vibration sense are intact in fingers and toes.  COORDINATION: Rapid alternating movements and fine finger movements are intact. There is no dysmetria on finger-to-nose and heel-knee-shin. There are no abnormal or extraneous movements.   GAIT/STANCE: She has stooped forward posture, decreased stride, steady gait Romberg is absent.   DIAGNOSTIC DATA (LABS, IMAGING, TESTING) - I reviewed patient records, labs, notes, testing and imaging myself where available.  Lab Results  Component Value Date   WBC 6.5 12/13/2014   HGB 12.4 12/13/2014   HCT 37.2 12/13/2014   MCV 83.6 12/13/2014   PLT 254.0 12/13/2014      Component Value Date/Time   NA 138 12/13/2014 1515   K 4.1 12/13/2014 1515   CL 105 12/13/2014 1515   CO2 28 12/13/2014 1515   GLUCOSE 98 12/13/2014 1515   GLUCOSE  126 09/07/2008   BUN 21 12/13/2014 1515   CREATININE 0.65 12/13/2014 1515   CALCIUM 9.6 12/13/2014 1515   PROT 7.0 12/13/2014 1515   ALBUMIN 4.4 12/13/2014 1515   AST 15 12/13/2014 1515   ALT 18 12/13/2014 1515   ALKPHOS 53 12/13/2014 1515   BILITOT 0.3 12/13/2014 1515   GFRNONAA 88* 01/09/2013 1053   GFRAA >90 01/09/2013 1053   Lab Results  Component Value Date   CHOL 169 03/19/2014   HDL 46.80 03/19/2014   LDLCALC 106* 03/19/2014   LDLDIRECT 98.0 04/10/2013   TRIG 80.0 03/19/2014   CHOLHDL 4 03/19/2014    Lab Results  Component Value Date   TSH 0.79 12/13/2014   ASSESSMENT AND PLAN 74 y.o. year old female with Parkinson's disease, mild memory trouble, visual hallucinations since 2016 worsening bilateral hands tremor,  Continue Azilect 1 mg daily.  Decreased Requip xr to 2 tablets 2 times a day,  Add on Sinemet 25/100 mg one tablet 3 times a day Continue moderate exercise,  Return to clinic in  3 months  Marcial Pacas, M.D. Ph.D.  [facility] [place], [place] [address] Phone: [phone] Fax:       [phone]

## 2015-03-18 ENCOUNTER — Telehealth: Payer: Self-pay | Admitting: Neurology

## 2015-03-18 NOTE — Telephone Encounter (Signed)
Patient's husband is calling. The patient was seen yesterday and given Rx carbidopa-levodopa (SINEMET IR) 25-100 MG per tablet. After taking this medication the patient throws up.  Should the patient stop this medication and start taking rOPINIRole (REQUIP XL) 2 MG 24 hr tablet. Please call to advise and discuss. Thank you.

## 2015-03-18 NOTE — Telephone Encounter (Signed)
I called back.  Spoke with Mr Cavazos.  He said  Patient began taking Sinemet and 30-40 minutes after taking the med she gets upset stomach and vomits.  This has occurred twice.  Denied any other symptoms or side effects.  Would like a message sent to provider for recommendation on possible drug change.  Please advise.  Thank you.

## 2015-03-18 NOTE — Telephone Encounter (Signed)
I have called, last night she took one pill of Sinemet 25/100 after dinner, which did help her tremor, but few hours later, complains of nausea, vomiting, she took another pill this morning, has been throwing up multiple times,  I doubt above GI reaction is due to Sinemet alone, she may suffered GI virus, I have advised her go back on previous Requip xr 2 mg 3 tablets every evening,  May retry Sinemet 25/100 half tablets 3 times day after meal, gradually titrating to 1 tablet 3 times a day, call back office for any other questions

## 2015-03-30 ENCOUNTER — Telehealth: Payer: Self-pay

## 2015-03-30 NOTE — Telephone Encounter (Signed)
Patient's husband is calling back. Patient took 1/2 pill of carbidopa-levodopa (SINEMET IR) 25-100 MG per tablet this morning and was fine. She took another 1/2 pill after lunch with food and within 30 minutes the patient starting vomiting. Now the patient has a fast heartbeat. Please call patient.

## 2015-03-30 NOTE — Telephone Encounter (Signed)
I have called her husband, she is now taking Requip xr 2 mg 3 tablets every night, Azilect 1 mg every day,  She could not tolerate Sinemet, will keep current dose, return to follow-up in August

## 2015-03-30 NOTE — Telephone Encounter (Signed)
Spoke to her husband - said she waited two weeks before trying Sinemet again to make sure her previous reaction was not related to a stomach virus.  After taking the second 1/2 tablet at lunch, she started to vomit again.  She felt her heart was racing but her husband felt her pulse was normal.  He did not check the rate.  They are requesting an alternate medication.

## 2015-03-30 NOTE — Telephone Encounter (Signed)
Melissa Douglas at 03/30/2015 3:20 PM     Status: Signed       Expand All Collapse All   Patient's husband is calling back. Patient took 1/2 pill of carbidopa-levodopa (SINEMET IR) 25-100 MG per tablet this morning and was fine. She took another 1/2 pill after lunch with food and within 30 minutes the patient starting vomiting. Now the patient has a fast heartbeat. Please call patient.

## 2015-04-10 ENCOUNTER — Other Ambulatory Visit: Payer: Self-pay | Admitting: Family Medicine

## 2015-05-05 DIAGNOSIS — D225 Melanocytic nevi of trunk: Secondary | ICD-10-CM | POA: Diagnosis not present

## 2015-05-05 DIAGNOSIS — D2239 Melanocytic nevi of other parts of face: Secondary | ICD-10-CM | POA: Diagnosis not present

## 2015-05-05 DIAGNOSIS — L814 Other melanin hyperpigmentation: Secondary | ICD-10-CM | POA: Diagnosis not present

## 2015-05-05 DIAGNOSIS — L821 Other seborrheic keratosis: Secondary | ICD-10-CM | POA: Diagnosis not present

## 2015-05-05 DIAGNOSIS — D179 Benign lipomatous neoplasm, unspecified: Secondary | ICD-10-CM | POA: Diagnosis not present

## 2015-05-12 ENCOUNTER — Other Ambulatory Visit: Payer: Self-pay | Admitting: Family Medicine

## 2015-05-12 NOTE — Telephone Encounter (Signed)
Med filled.  

## 2015-06-20 ENCOUNTER — Ambulatory Visit (INDEPENDENT_AMBULATORY_CARE_PROVIDER_SITE_OTHER): Payer: Medicare Other | Admitting: Neurology

## 2015-06-20 ENCOUNTER — Encounter: Payer: Self-pay | Admitting: Neurology

## 2015-06-20 VITALS — BP 113/54 | HR 71 | Ht 62.0 in | Wt 165.0 lb

## 2015-06-20 DIAGNOSIS — R413 Other amnesia: Secondary | ICD-10-CM

## 2015-06-20 DIAGNOSIS — G20 Parkinson's disease: Secondary | ICD-10-CM | POA: Diagnosis not present

## 2015-06-20 DIAGNOSIS — R441 Visual hallucinations: Secondary | ICD-10-CM

## 2015-06-20 MED ORDER — CARBIDOPA 25 MG PO TABS: 25.0000 mg | ORAL_TABLET | Freq: Three times a day (TID) | ORAL | Status: DC

## 2015-06-20 MED ORDER — CARBIDOPA-LEVODOPA 25-100 MG PO TABS: 1.0000 | ORAL_TABLET | Freq: Three times a day (TID) | ORAL | Status: DC

## 2015-06-20 NOTE — Progress Notes (Signed)
Chief Complaint  Patient presents with  . Parkinson's Disease    She is here with her husband, Melissa Douglas.  She feels she is doing well.  She has joined a gym and has been working with a Physiological scientist twice weekly.  Feels her strength, energy and walking is better.      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   Since 2011, she noticed right hand resting tremor,  also noticed mild left hand tremor, she denies gait difficulty, she has decreased sense of smell, she has excessive wild dreams, talking out of her dreams, occasionally constipation, no orthostatic symptoms. 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  She complained of right buttock area pain, shooting pain along right posterior thigh in 2013, no weakness, getting better when standing up   UPDATE Feb 22nd 2016: She is now complaining worsening parkinsonian features,visual hallucinations.  UPDATE May 5th 2016: She is on lower dose of requip xr 2mg  3 tabs qhs, visual hallucination has almost disappeared, Azilect 1mg  qday,  still enjoys painting,  She does not exercise much, complains of stiff knee, lightheadedness when getting up, mild memory trouble, tends to misplace things.   UPDATE August 8th 2016: She works out regularly with Physiological scientist, she feels better after workout but only exercise twice a week, she has occasionally visual hallucinations, taking Requip XR 2 mg 3 tablets every night, Azilect 1 mg every day  She tried Sinemet May 2016, even with half tablets after meal, she complains of severe GI side effect nausea, vomiting,  She has worsening bilateral hands tremor, mild increased memory trouble   REVIEW OF SYSTEMS: Full 14 system review of systems performed and notable only for: as above    ALLERGIES: Allergies  Allergen Reactions  . Carbidopa-Levodopa Nausea And Vomiting  . Codeine     REACTION: sick  . Ether     VERY DIZZY  . Gabapentin     REACTION: hallucinations, dizziness and vomiting  . Iodine     HIVES    HOME MEDICATIONS: Outpatient Prescriptions Prior to Visit  Medication Sig Dispense Refill  . acetaminophen (TYLENOL) 500 MG tablet Take 500-1,000 mg by mouth every 6 (six) hours as needed for pain.     Marland Kitchen AZILECT 1 MG TABS tablet TAKE 1 TABLET BY MOUTH EVERY DAY 30 tablet 6  . levothyroxine (SYNTHROID, LEVOTHROID) 112 MCG tablet TAKE 1 TABLET BY MOUTH DAILY 30 tablet 6  . rOPINIRole (REQUIP XL) 2 MG 24 hr tablet 2 tabs po qhs (Patient taking differently: 3 tabs po qhs) 90 tablet 6  . carbidopa-levodopa (SINEMET IR)  25-100 MG per tablet Take 1 tablet by mouth 3 (three) times daily. 90 tablet 6   No facility-administered medications prior to visit.    PAST MEDICAL HISTORY: Past Medical History  Diagnosis Date  . Chronic joint pain   . Hypothyroidism   . Osteopenia   . Parkinson's disease   . Cataract Right eye    HAD SURGERY  . Multiple nasal polyps   . Hx of adenomatous colonic polyps 08/18/2014  . Parkinson disease     PAST SURGICAL HISTORY: Past Surgical  History  Procedure Laterality Date  . Abdominal hysterectomy      ovaries remain  . Appendectomy    . Tonsillectomy    . Cataract extraction      FAMILY HISTORY: No family history on file.  SOCIAL HISTORY: History   Social History  . Marital Status: Married    Spouse Name: Melissa Douglas  . Number of Children: 3  . Years of Education: College   Occupational History  . Retired    Social History Main Topics  . Smoking status: Former Smoker    Types: Cigarettes    Quit date: 01/01/2001  . Smokeless tobacco: Never Used  . Alcohol Use: 7.2 - 8.4 oz/week    12-14 Glasses of wine per week     Comment: occ.  . Drug Use: No  . Sexual Activity: Not on file   Other Topics Concern  . Not on file   Social History Narrative   Patient lives at home with her husband and has a college education.    Caffeine- occasional use   Left-handed.      PHYSICAL EXAM  Filed Vitals:   06/20/15 1052  BP: 113/54  Pulse: 71  Height: 5\' 2"  (1.575 m)  Weight: 165 lb (74.844 kg)   Body mass index is 30.17 kg/(m^2).  PHYSICAL EXAMNIATION:  Gen: NAD, conversant, well nourised, obese, well groomed                     Cardiovascular: Regular rate rhythm, no peripheral edema, warm, nontender. Eyes: Conjunctivae clear without exudates or hemorrhage Neck: Supple, no carotid bruise. Pulmonary: Clear to auscultation bilaterally   NEUROLOGICAL EXAM:  MENTAL STATUS: Speech:    Speech is normal; fluent and spontaneous with normal comprehension.  Cognition:   Tends to repeat herself,   normal language, fund of knowledge  CRANIAL NERVES: CN II: Visual fields are full to confrontation. Fundoscopic exam is normal with sharp discs and no vascular changes. pupil were equal round reactive to light  CN III, IV, VI: extraocular movement are normal. No ptosis. CN V: Facial sensation is intact to pinprick in all 3 divisions bilaterally. Corneal responses are intact.  CN VII: Face is symmetric with normal  eye closure and smile. CN VIII: Hearing is normal to rubbing fingers CN IX, X: Palate elevates symmetrically. Phonation is normal. CN XI: Head turning and shoulder shrug are intact CN XII: Tongue is midline with normal movements and no atrophy.  MOTOR: She hamild to moderate bilateral resting tremor, moderate bilateral limb, nuchal rigidity,  bradykinesia,Increased with reinforcement maneuver   REFLEXES: Reflexes are 2+ and symmetric at the biceps, triceps, knees, and ankles. Plantar responses are flexor.  SENSORY: Light touch, pinprick, position sense, and vibration sense are intact in fingers and toes.  COORDINATION: Rapid alternating movements and fine finger movements are intact. There is no dysmetria on finger-to-nose and heel-knee-shin. There are no abnormal or extraneous movements.   GAIT/STANCE: She  has stooped forward posture, decreased stride,  decreased bilateral arm swing, steady gait Romberg is absent.   DIAGNOSTIC DATA (LABS, IMAGING, TESTING) - I reviewed patient records, labs, notes, testing and imaging myself where available.  Lab Results  Component Value Date   WBC 6.5 12/13/2014   HGB 12.4 12/13/2014   HCT 37.2 12/13/2014   MCV 83.6 12/13/2014   PLT 254.0 12/13/2014      Component Value Date/Time   NA 138 12/13/2014 1515   K 4.1 12/13/2014 1515   CL 105 12/13/2014 1515   CO2 28 12/13/2014 1515   GLUCOSE 98 12/13/2014 1515   GLUCOSE 126 09/07/2008   BUN 21 12/13/2014 1515   CREATININE 0.65 12/13/2014 1515   CALCIUM 9.6 12/13/2014 1515   PROT 7.0 12/13/2014 1515   ALBUMIN 4.4 12/13/2014 1515   AST 15 12/13/2014 1515   ALT 18 12/13/2014 1515   ALKPHOS 53 12/13/2014 1515   BILITOT 0.3 12/13/2014 1515   GFRNONAA 88* 01/09/2013 1053   GFRAA >90 01/09/2013 1053   Lab Results  Component Value Date   CHOL 169 03/19/2014   HDL 46.80 03/19/2014   LDLCALC 106* 03/19/2014   LDLDIRECT 98.0 04/10/2013   TRIG 80.0 03/19/2014   CHOLHDL 4 03/19/2014     Lab Results  Component Value Date   TSH 0.79 12/13/2014   ASSESSMENT AND PLAN 74 y.o. year old female   Idiopathic Parkinson's disease  Keep current dose of Requip XR 2 mg 3 tablets every night  Azilect 1 mg daily  Consider add on Sinemet 25/100 gradually titrating to 1 tablet 3 times a day, take carbidopa 25 mg with each dose of Sinemet to decrease GI side effect Memory loss Visual hallucination  If she can tolerate Sinemet, may consider lowering her Requip dose,  Encourage her moderate exercise     Marcial Pacas, M.D. Ph.D.  [facility] [place], [place] [address] Phone: [phone] Fax:      [phone]

## 2015-07-19 ENCOUNTER — Other Ambulatory Visit: Payer: Self-pay | Admitting: Neurology

## 2015-07-19 NOTE — Telephone Encounter (Signed)
Last OV note says: Keep current dose of Requip XR 2 mg 3 tablets every night

## 2015-07-25 ENCOUNTER — Telehealth: Payer: Self-pay | Admitting: Neurology

## 2015-07-25 NOTE — Telephone Encounter (Signed)
I contacted the pharmacy.  Ins is requiring a PA due to QL exceeded.  Ins has been contacted ad provided with clinical info.  Request is under review Ref # [id number].  I called the patient to advise.  Got no answer.  Left message.

## 2015-07-25 NOTE — Telephone Encounter (Signed)
Pt 's husband called and they need a refill on rOPINIRole (REQUIP XL) 2 MG 24 hr tablet , they are unable to get the rx because the pharmacy is saying they need approval . May call [phone]

## 2015-07-27 NOTE — Telephone Encounter (Signed)
Ins has approved the request for coverage on Ropinirole ER effective until 07/24/2016.  They have notified the patient of this decision.

## 2015-08-08 ENCOUNTER — Telehealth: Payer: Self-pay | Admitting: Neurology

## 2015-08-08 NOTE — Telephone Encounter (Signed)
Dr. Rhea Belton instructions at her last office visit were to continue this medication.  Patient and husband aware.

## 2015-08-08 NOTE — Telephone Encounter (Signed)
pts husband called and want to know if pt needs to continue taking  AZILECT 1 MG TABS tablet. Does it still stay part of pts medication program. Please call and advise [phone]

## 2015-08-25 ENCOUNTER — Telehealth: Payer: Self-pay | Admitting: *Deleted

## 2015-08-25 NOTE — Telephone Encounter (Signed)
Form on Michelle desk. 

## 2015-08-26 DIAGNOSIS — Z0289 Encounter for other administrative examinations: Secondary | ICD-10-CM

## 2015-08-31 ENCOUNTER — Telehealth: Payer: Self-pay | Admitting: *Deleted

## 2015-08-31 NOTE — Telephone Encounter (Signed)
Patient form at the front desk. 

## 2015-09-27 ENCOUNTER — Telehealth: Payer: Self-pay | Admitting: Neurology

## 2015-09-27 ENCOUNTER — Encounter: Payer: Self-pay | Admitting: *Deleted

## 2015-09-27 NOTE — Telephone Encounter (Signed)
Spoke to Judy's husband - she is currently taking Azilect 1mg  daily, Sinemet 25/100, 0.5 tablets daily and Carbidopa 25mg , TID (she has been on this regimen for the last five weeks).  She is having increase anxiety, more bad than good days with her mobility, and excessive daytime somnolence.  She has been taking her medications two hours after meals and they would like to clarify the best way to take each medication.  An early follow up appointment has been scheduled for them to come in to discuss treatment.

## 2015-09-27 NOTE — Telephone Encounter (Signed)
Pt's husband called and would like some guidance when it comes to taking Carbidopa 25 MG tablet. States they think they are taking the medication right but are not sure. Please call and advise [phone]

## 2015-09-29 ENCOUNTER — Ambulatory Visit (INDEPENDENT_AMBULATORY_CARE_PROVIDER_SITE_OTHER): Payer: Medicare Other | Admitting: Neurology

## 2015-09-29 ENCOUNTER — Encounter: Payer: Self-pay | Admitting: Neurology

## 2015-09-29 VITALS — BP 130/72 | HR 68 | Resp 16 | Ht 62.0 in | Wt 164.0 lb

## 2015-09-29 DIAGNOSIS — G20 Parkinson's disease: Secondary | ICD-10-CM | POA: Diagnosis not present

## 2015-09-29 NOTE — Progress Notes (Signed)
Chief Complaint  Patient presents with  . Parkinson's Disease    Melissa Douglas is here with her husband Melissa Douglas to discuss PD meds.  She has been off of Requip for 5 weeks--hallucinations have stopped.  Sts. she is now having vivid dreams--Mark sts. she laughs alot in her sleep.Sts. tremors in hands are worse. New c/o tremor in head as well. Sts. she also clicks her teeth together and has never done that before. Sts. 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   Since 2011, she noticed right hand resting tremor,  also noticed mild left hand tremor, she denies gait difficulty, she has decreased sense of smell, she has excessive wild dreams, talking out of her dreams, occasionally constipation, no orthostatic symptoms. 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  She complained of right buttock area pain, shooting pain along right posterior thigh in 2013, no weakness, getting better when standing up   UPDATE Feb  22nd 2016: She is now complaining worsening parkinsonian features,visual hallucinations.  UPDATE May 5th 2016: She is on lower dose of requip xr 2mg  3 tabs qhs, visual hallucination has almost disappeared, Azilect 1mg  qday, still enjoys painting,  She does not exercise much, complains of stiff knee, lightheadedness when getting up, mild memory trouble, tends to misplace things.   UPDATE August 8th 2016: She works out regularly with Physiological scientist, she feels better after workout but only exercise twice a week, she has occasionally visual hallucinations, taking Requip XR 2 mg 3 tablets every night, Azilect 1 mg every day  She tried Sinemet May 2016, even with half tablets after meal, she complains of severe GI side effect nausea, vomiting,  She has worsening bilateral hands tremor, mild increased memory trouble   UPDATE Sep 29 2015: She is able to tolerate sinemet 25/100 1/2 tid +carbidopa, she has less visual hallucination after stop requip, she still has significant bilateral hands tremor,  She noticed slowness and stiffness.  She is not able to paint any more, she still goes to personal trainer 3 times a week at [facility],  She has good appetite, she has trouble sleeping, got up many times to use bathroom, she continues to have mild memory trouble.  REVIEW OF SYSTEMS: Full 14 system review of systems performed and notable only for: Daytime sleepiness, sleep talking, environmental allergy, dizziness, headaches  ALLERGIES: Allergies  Allergen Reactions  . Carbidopa-Levodopa Nausea And Vomiting  . Codeine     REACTION: sick  . Ether     VERY DIZZY  .  Gabapentin     REACTION: hallucinations, dizziness and vomiting  . Iodine     HIVES    HOME MEDICATIONS: Outpatient Prescriptions Prior to Visit  Medication Sig Dispense Refill  . acetaminophen (TYLENOL) 500 MG tablet Take 500-1,000 mg by mouth every 6 (six) hours as needed for pain.     Marland Kitchen AZILECT 1 MG TABS tablet TAKE 1 TABLET BY MOUTH  EVERY DAY 30 tablet 6  . Carbidopa 25 MG tablet Take 1 tablet by mouth 3 (three) times daily. 90 tablet 3  . carbidopa-levodopa (SINEMET) 25-100 MG per tablet Take 1 tablet by mouth 3 (three) times daily. 90 tablet 3  . levothyroxine (SYNTHROID, LEVOTHROID) 112 MCG tablet TAKE 1 TABLET BY MOUTH DAILY 30 tablet 6  . rOPINIRole (REQUIP XL) 2 MG 24 hr tablet TAKE 3 TABLETS BY MOUTH EVERY NIGHT AT BEDTIME (Patient not taking: Reported on 09/29/2015) 90 tablet 6   No facility-administered medications prior to visit.    PAST MEDICAL HISTORY: Past Medical History  Diagnosis Date  . Chronic joint pain   . Hypothyroidism   . Osteopenia   . Parkinson's disease ([place])   . Cataract Right eye    HAD SURGERY  . Multiple nasal polyps   . Hx of adenomatous colonic polyps 08/18/2014  . Parkinson disease ([place])     PAST SURGICAL HISTORY: Past Surgical History  Procedure Laterality Date  . Abdominal hysterectomy      ovaries remain  . Appendectomy    . Tonsillectomy    . Cataract extraction      FAMILY HISTORY: No family history on file.  SOCIAL HISTORY: Social History   Social History  . Marital Status: Married    Spouse Name: Melissa Douglas  . Number of Children: 3  . Years of Education: College   Occupational History  . Retired    Social History Main Topics  . Smoking status: Former Smoker    Types: Cigarettes    Quit date: 01/01/2001  . Smokeless tobacco: Never Used  . Alcohol Use: 7.2 - 8.4 oz/week    12-14 Glasses of wine per week     Comment: occ.  . Drug Use: No  . Sexual Activity: Not on file   Other Topics Concern  . Not on file   Social History Narrative   Patient lives at home with her husband and has a college education.    Caffeine- occasional use   Left-handed.      PHYSICAL EXAM  Filed Vitals:   09/29/15 1517  BP: 130/72  Pulse: 68  Resp: 16  Height: 5\' 2"  (1.575 m)  Weight: 164 lb (74.39 kg)   Body mass index is 29.99 kg/(m^2).  PHYSICAL  EXAMNIATION:  Gen: NAD, conversant, well nourised, obese, well groomed                     Cardiovascular: Regular rate rhythm, no peripheral edema, warm, nontender. Eyes: Conjunctivae clear without exudates or hemorrhage Neck: Supple, no carotid bruise. Pulmonary: Clear to auscultation bilaterally   NEUROLOGICAL EXAM:  MENTAL STATUS: Speech:    Speech is normal; fluent and spontaneous with normal comprehension.  Cognition:   Tends to repeat herself,   normal language, fund of knowledge  CRANIAL NERVES: CN II: Visual fields are full to confrontation. Fundoscopic exam is normal with sharp discs and no vascular changes. pupil were equal round reactive to light  CN III, IV, VI: extraocular movement are normal. No ptosis. CN V: Facial sensation  is intact to pinprick in all 3 divisions bilaterally. Corneal responses are intact.  CN VII: Face is symmetric with normal eye closure and smile. CN VIII: Hearing is normal to rubbing fingers CN IX, X: Palate elevates symmetrically. Phonation is normal. CN XI: Head turning and shoulder shrug are intact CN XII: Tongue is midline with normal movements and no atrophy.  MOTOR: She hamild to moderate bilateral resting tremor, moderate bilateral limb, nuchal rigidity,  bradykinesia,Increased with reinforcement maneuver   REFLEXES: Reflexes are 2+ and symmetric at the biceps, triceps, knees, and ankles. Plantar responses are flexor.  SENSORY: Light touch, pinprick, position sense, and vibration sense are intact in fingers and toes.  COORDINATION: Rapid alternating movements and fine finger movements are intact. There is no dysmetria on finger-to-nose and heel-knee-shin.  GAIT/STANCE: She has stooped forward posture, decreased stride,  decreased bilateral arm swing, steady gait Romberg is absent.   DIAGNOSTIC DATA (LABS, IMAGING, TESTING) - I reviewed patient records, labs, notes, testing and imaging myself where available.  Lab Results   Component Value Date   WBC 6.5 12/13/2014   HGB 12.4 12/13/2014   HCT 37.2 12/13/2014   MCV 83.6 12/13/2014   PLT 254.0 12/13/2014      Component Value Date/Time   NA 138 12/13/2014 1515   K 4.1 12/13/2014 1515   CL 105 12/13/2014 1515   CO2 28 12/13/2014 1515   GLUCOSE 98 12/13/2014 1515   GLUCOSE 126 09/07/2008   BUN 21 12/13/2014 1515   CREATININE 0.65 12/13/2014 1515   CALCIUM 9.6 12/13/2014 1515   PROT 7.0 12/13/2014 1515   ALBUMIN 4.4 12/13/2014 1515   AST 15 12/13/2014 1515   ALT 18 12/13/2014 1515   ALKPHOS 53 12/13/2014 1515   BILITOT 0.3 12/13/2014 1515   GFRNONAA 88* 01/09/2013 1053   GFRAA >90 01/09/2013 1053   Lab Results  Component Value Date   CHOL 169 03/19/2014   HDL 46.80 03/19/2014   LDLCALC 106* 03/19/2014   LDLDIRECT 98.0 04/10/2013   TRIG 80.0 03/19/2014   CHOLHDL 4 03/19/2014    Lab Results  Component Value Date   TSH 0.79 12/13/2014   ASSESSMENT AND PLAN 74 y.o. year old female   Idiopathic Parkinson's disease  keep Azilect 1 mg daily  Sinemet 25/100 1 tablet plus carbidopa 25 mg 3 times a day at 8, 11, 5 PM Memory loss Visual hallucination  Improved after stop Requip     Marcial Pacas, M.D. Ph.D.  [facility] [place],  [address] Phone: [phone] Fax:      [phone]

## 2015-09-30 ENCOUNTER — Telehealth: Payer: Self-pay | Admitting: Neurology

## 2015-09-30 NOTE — Telephone Encounter (Signed)
Husband called to advise that every time wife takes whole pill of carbidopa-levodopa (SINEMET) 25-100 MG per tablet she becomes violently w/vomiting. Would like to know if wife is just supposed to fight through this until body adjusts to it or should she go back to 1/2 of a pill then call Dr. Krista Blue on Monday? Husband would like a call back to discuss this.

## 2015-10-03 ENCOUNTER — Other Ambulatory Visit: Payer: Self-pay | Admitting: *Deleted

## 2015-10-03 NOTE — Telephone Encounter (Signed)
Pt's husband has called back , states that the Sinemet is " the problem". He wants to know what else can be done. The pt is taking only half a pill and is trying to increase as discussed , to a full pill. When she takes a full pill he is violently vomiting. Please call pt back to discuss [phone], [name] ( husband)

## 2015-10-03 NOTE — Telephone Encounter (Signed)
Please call patient to see if she can tolerate sinemet better.  She is suppose to take sinmet 25/100 one po tid + carbidopa 25mg  with each tab

## 2015-10-03 NOTE — Telephone Encounter (Signed)
Per Dr. Krista Blue, she can try one of the following options:  1) Try 1 tablet of Sinemet TID, along with carbidopa 50mg  with each dose or 2) Stay on 0.5 tablet of Sinemet TID, along with carbidopa 25mg  with each dose.  Spoke with [name] (husband on [employer]), who feels she walks better with the higher dose but she is able to handle option 2 without GI side effects.  He will encourage her to try option 1 but is unsure if she will be willing after being so sick.  He will call back with concerns or if an early refill of carbidopa is needed.

## 2015-10-04 ENCOUNTER — Encounter: Payer: Self-pay | Admitting: *Deleted

## 2015-10-04 NOTE — Telephone Encounter (Signed)
Spoke to [employer] - reports Melissa Douglas is doing much better - she is able to tolerate one tablet of Sinemet 25-100 TID, along with carbidopa 50mg  TID.  Says she has been up moving around this morning and making Christmas crafts.  Dr. Krista Blue is aware of her progress.  She will continue this dose and return to clinic, as scheduled, in three months.  He does not need a new refill right now - he has recently picked up a six month supply of the carbidopa.  He will call when he is ready for a refill.  Medication list update to reflect current medication regimen.

## 2015-10-04 NOTE — Telephone Encounter (Signed)
Patient's husband is calling and states the patient is doing well taking the 50 mg of Carbidopa.  He would like to know how long she should stay on this dosage.  Please call.

## 2015-10-05 ENCOUNTER — Encounter: Payer: Self-pay | Admitting: *Deleted

## 2015-10-05 NOTE — Telephone Encounter (Signed)
Pt's husband called in and stated that the [place] started off working out well yesterday morning but then she start vomiting about 30-40 minutes after she took the evening pills.  He said they have gone back to the 25mg  of Carbidopa and then 25-100mg  of the Carbidopa-Levadopa.

## 2015-10-05 NOTE — Telephone Encounter (Signed)
Spoke to [place] (husband on [employer]) - says Melissa Douglas started vomiting again last night.  She has cut her dose of Sinemet 25-100 back down to 0.5 tab TID, along with carbidopa 25mg , TID.  She is feeling better today.  She plans to stay on this dose until her next follow up appt.  They will call back with any further concerns.

## 2015-10-20 ENCOUNTER — Ambulatory Visit: Payer: Medicare Other | Admitting: Neurology

## 2015-10-26 ENCOUNTER — Other Ambulatory Visit: Payer: Self-pay | Admitting: Neurology

## 2015-11-09 ENCOUNTER — Encounter: Payer: Self-pay | Admitting: *Deleted

## 2015-11-09 DIAGNOSIS — Z23 Encounter for immunization: Secondary | ICD-10-CM | POA: Diagnosis not present

## 2015-11-10 ENCOUNTER — Ambulatory Visit: Payer: Medicare Other | Admitting: Neurology

## 2015-11-12 ENCOUNTER — Other Ambulatory Visit: Payer: Self-pay | Admitting: Neurology

## 2015-11-21 ENCOUNTER — Other Ambulatory Visit: Payer: Self-pay | Admitting: Family Medicine

## 2015-12-14 ENCOUNTER — Other Ambulatory Visit: Payer: Self-pay | Admitting: Family Medicine

## 2016-01-03 ENCOUNTER — Ambulatory Visit (INDEPENDENT_AMBULATORY_CARE_PROVIDER_SITE_OTHER): Payer: Medicare Other | Admitting: Neurology

## 2016-01-03 ENCOUNTER — Encounter: Payer: Self-pay | Admitting: Neurology

## 2016-01-03 VITALS — BP 131/73 | HR 74 | Ht 62.0 in | Wt 159.0 lb

## 2016-01-03 DIAGNOSIS — R413 Other amnesia: Secondary | ICD-10-CM | POA: Diagnosis not present

## 2016-01-03 DIAGNOSIS — G20 Parkinson's disease: Secondary | ICD-10-CM

## 2016-01-03 MED ORDER — RASAGILINE MESYLATE 1 MG PO TABS: 1.0000 mg | ORAL_TABLET | Freq: Every day | ORAL | Status: DC

## 2016-01-03 MED ORDER — CARBIDOPA-LEVODOPA 25-100 MG PO TABS: 1.0000 | ORAL_TABLET | Freq: Three times a day (TID) | ORAL | Status: DC

## 2016-01-03 MED ORDER — QUETIAPINE FUMARATE 25 MG PO TABS: ORAL_TABLET | ORAL | Status: DC

## 2016-01-03 NOTE — Patient Instructions (Signed)
Georgette Shell, M.D. Associate Professor, Neurology Neurosurgery Clinical Interests Movement Disorders, Botulinum Toxin, Parkinson's Disease, Deep Brain Stimulation, Dystonia, Tremor   New Patient Appointments: [phone]  Returning Patient Appointments: [phone] Department: [phone]

## 2016-01-03 NOTE — Progress Notes (Signed)
Chief Complaint  Patient presents with  . Parkinson's Disease    MMSE 27/30 - 15 animals.  She is here with her husband [name].  Feels her memory, weakness and tremors have worsened since her last visit in November 2017.      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   Since 2011, she noticed right hand resting tremor,  also noticed mild left hand tremor, she denies gait difficulty, she has decreased sense of smell, she has excessive wild dreams, talking out of her dreams, occasionally constipation, no orthostatic symptoms. 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  She complained of right buttock area pain, shooting pain along right posterior thigh in 2013, no weakness, getting better when standing up   UPDATE Feb 22nd 2016: She is now complaining worsening parkinsonian features,visual hallucinations.  UPDATE May 5th 2016: She is on lower dose of requip xr 2mg  3 tabs qhs, visual hallucination has almost disappeared, Azilect 1mg  qday, still enjoys painting,  She does not  exercise much, complains of stiff knee, lightheadedness when getting up, mild memory trouble, tends to misplace things.   UPDATE August 8th 2016: She works out regularly with Physiological scientist, she feels better after workout but only exercise twice a week, she has occasionally visual hallucinations, taking Requip XR 2 mg 3 tablets every night, Azilect 1 mg every day  She tried Sinemet May 2016, even with half tablets after meal, she complains of severe GI side effect nausea, vomiting,  She has worsening bilateral hands tremor, mild increased memory trouble   UPDATE Sep 29 2015: She is able to tolerate sinemet 25/100 1/2 tid +carbidopa, she has less visual hallucination after stop requip, she still has significant bilateral hands tremor,  She noticed slowness and stiffness.  She is not able to paint any more, she still goes to personal trainer 3 times a week at [facility],  She has good appetite, she has trouble sleeping, got up many times to use bathroom, she continues to have mild memory trouble.  UPDATE [name] 03 2016: She is with her husband today, very depressed looking, she has increased tremor, more gait difficulty, difficulty swing her arms,  She is hardly able to get into bed, she has lost appetite, lost some weight, she has difficulty sleeping,  She is frustrated, could not find a comfortable position, she is up 2-3 times using bathroom.  She has increased psychosis, ssw some of her girl friend from high school spending the night at her house.   She has no visual hallucinations. She has vivid dreams.  She is only able to  take sinemet 25/100 1/2 tab with carbidopa 25mg , she still has significant GI side effect.   REVIEW OF SYSTEMS: Full 14 system review of systems performed and notable only for: Memory loss, insomnia, wrapping difficulty, neck pain, neck stiffness, memory loss, weakness, tremor, fever, confusion  ALLERGIES: Allergies  Allergen Reactions  . Carbidopa-Levodopa Nausea And  Vomiting  . Codeine     REACTION: sick  . Ether     VERY DIZZY  . Gabapentin     REACTION: hallucinations, dizziness and vomiting  . Iodine     HIVES    HOME MEDICATIONS: Outpatient Prescriptions Prior to Visit  Medication Sig Dispense Refill  . acetaminophen (TYLENOL) 500 MG tablet Take 500-1,000 mg by mouth every 6 (six) hours as needed for pain.     Marland Kitchen AZILECT 1 MG TABS tablet TAKE 1 TABLET BY MOUTH EVERY DAY 30 tablet 3  . Carbidopa 25 MG tablet TAKE 1 TABLET BY MOUTH THREE TIMES DAILY 270 tablet 3  . carbidopa-levodopa (SINEMET) 25-100 MG per tablet Take 1 tablet by mouth 3 (three) times daily. 90 tablet 3  . levothyroxine (SYNTHROID, LEVOTHROID) 112 MCG tablet TAKE 1 TABLET BY MOUTH DAILY 90 tablet 0   No facility-administered medications prior to visit.    PAST MEDICAL HISTORY: Past Medical History  Diagnosis Date  . Chronic joint pain   . Hypothyroidism   . Osteopenia   . Parkinson's disease ([place])   . Cataract Right eye    HAD SURGERY  . Multiple nasal polyps   . Hx of adenomatous colonic polyps 08/18/2014  . Parkinson disease ([place])     PAST SURGICAL HISTORY: Past Surgical History  Procedure Laterality Date  . Abdominal hysterectomy      ovaries remain  . Appendectomy    . Tonsillectomy    . Cataract extraction      FAMILY HISTORY: No family history on file.  SOCIAL HISTORY: Social History   Social History  . Marital Status: Married    Spouse Name: [name]  . Number of Children: 3  . Years of Education: College   Occupational History  . Retired    Social History Main Topics  . Smoking status: Former Smoker    Types: Cigarettes    Quit date: 01/01/2001  . Smokeless tobacco: Never Used  . Alcohol Use: 7.2 - 8.4 oz/week    12-14 Glasses of wine per week     Comment: occ.  . Drug Use: No  . Sexual Activity: Not on file   Other Topics Concern  . Not on file   Social History Narrative   Patient lives at home with her husband and has a college  education.    Caffeine- occasional use   Left-handed.      PHYSICAL EXAM  Filed Vitals:   01/03/16 1355  BP: 131/73  Pulse: 74  Height: 5\' 2"  (1.575 m)  Weight: 159 lb (72.122 kg)   Body mass index is 29.07 kg/(m^2).  PHYSICAL EXAMNIATION:  Gen: NAD, conversant, well nourised, obese, well groomed                     Cardiovascular: Regular rate rhythm, no peripheral edema, warm, nontender. Eyes: Conjunctivae clear without exudates or hemorrhage Neck: Supple, no carotid bruise. Pulmonary: Clear to auscultation bilaterally   NEUROLOGICAL EXAM:  MENTAL STATUS: MENTAL STATUS: Speech:    Speech is normal; fluent and spontaneous with normal comprehension.  Cognition: MMSE 27/30, MMSE 15     Orientation  to time, place and person: she is not oriented to county     Normal recent and remote memory     Attention span and concentration: she has difficulty with attention     Normal Language, naming, repeating,spontaneous speech     Fund of knowledge     CRANIAL NERVES: CN II: Visual fields are full to confrontation. Fundoscopic exam is normal with sharp discs and no vascular changes. pupil were equal round reactive to light  CN III, IV, VI: extraocular movement are normal. No ptosis. CN V: Facial sensation is intact to pinprick in all 3 divisions bilaterally. Corneal responses are intact.  CN VII: Face is symmetric with normal eye closure and smile. CN VIII: Hearing is normal to rubbing fingers CN IX, X: Palate elevates symmetrically. Phonation is normal. CN XI: Head turning and shoulder shrug are intact CN XII: Tongue is midline with normal movements and no atrophy.  MOTOR: She hamild to moderate bilateral resting tremor, moderate bilateral limb, nuchal rigidity,  bradykinesia,Increased with reinforcement maneuver   REFLEXES: Reflexes are 2+ and symmetric at the biceps, triceps, knees, and ankles. Plantar responses are flexor.  SENSORY: Light touch, pinprick, position  sense, and vibration sense are intact in fingers and toes.  COORDINATION: Rapid alternating movements and fine finger movements are intact. There is no dysmetria on finger-to-nose and heel-knee-shin.  GAIT/STANCE: She has stooped forward posture, decreased stride,  decreased bilateral arm swing, steady gait Romberg is absent.   DIAGNOSTIC DATA (LABS, IMAGING, TESTING) - I reviewed patient records, labs, notes, testing and imaging myself where available.  Lab Results  Component Value Date   WBC 6.5 12/13/2014   HGB 12.4 12/13/2014   HCT 37.2 12/13/2014   MCV 83.6 12/13/2014   PLT 254.0 12/13/2014      Component Value Date/Time   NA 138 12/13/2014 1515   K 4.1 12/13/2014 1515   CL 105 12/13/2014 1515   CO2 28 12/13/2014 1515   GLUCOSE 98 12/13/2014 1515   GLUCOSE 126 09/07/2008   BUN 21 12/13/2014 1515   CREATININE 0.65 12/13/2014 1515   CALCIUM 9.6 12/13/2014 1515   PROT 7.0 12/13/2014 1515   ALBUMIN 4.4 12/13/2014 1515   AST 15 12/13/2014 1515   ALT 18 12/13/2014 1515   ALKPHOS 53 12/13/2014 1515   BILITOT 0.3 12/13/2014 1515   GFRNONAA 88* 01/09/2013 1053   GFRAA >90 01/09/2013 1053   Lab Results  Component Value Date   CHOL 169 03/19/2014   HDL 46.80 03/19/2014   LDLCALC 106* 03/19/2014   LDLDIRECT 98.0 04/10/2013   TRIG 80.0 03/19/2014   CHOLHDL 4 03/19/2014    Lab Results  Component Value Date   TSH 0.79 12/13/2014   ASSESSMENT AND PLAN 75 y.o. year old female   Idiopathic Parkinson's disease  keep Azilect 1 mg daily  Titrating Sinemet 25/100 1 tablet plus carbidopa 25 mg 3 times a day at 8, 11, 5 PM   Refer to [facility] Dr. Hoyt Koch  Memory loss  MMSE 27/30  Psychosis, insomnia  Requip worsening her symptoms  Add on seroquel 25 mg half to 1 tablet every night   Face to face time was 45 minutes, greater than 50% of the time was spent in counseling and coordination of care with the patient  Marcial Pacas, M.D. Ph.D.  [facility] [place], [place] [address] Phone: [phone] Fax:      [phone]

## 2016-01-04 ENCOUNTER — Encounter: Payer: Self-pay | Admitting: *Deleted

## 2016-01-04 ENCOUNTER — Telehealth: Payer: Self-pay | Admitting: Neurology

## 2016-01-04 NOTE — Telephone Encounter (Signed)
Colletta Maryland with [facility] is calling to advise that medication QUEtiapine (SEROQUEL) 25 MG tablet has been approved from 01-03-16 through 01-02-17 for the patient.

## 2016-01-23 ENCOUNTER — Telehealth: Payer: Self-pay | Admitting: Neurology

## 2016-01-23 NOTE — Telephone Encounter (Signed)
Per Dr. Krista Blue, ok to have procedure with localized numbing.

## 2016-01-23 NOTE — Telephone Encounter (Signed)
August/Dr. Sherryle Lis [phone] called regarding root canal that patient needs to have done, husband was concerned regarding medications patient takes for Parkinson's and that root canal wouldn't interfere with this. August states that they will be using localized numbing for this procedure. Please call to advise.

## 2016-01-24 NOTE — Telephone Encounter (Signed)
Spoke to August - she has been notified of Dr. Rhea Belton response.

## 2016-02-09 ENCOUNTER — Ambulatory Visit (INDEPENDENT_AMBULATORY_CARE_PROVIDER_SITE_OTHER): Payer: Medicare Other | Admitting: Family Medicine

## 2016-02-09 ENCOUNTER — Encounter: Payer: Self-pay | Admitting: Family Medicine

## 2016-02-09 VITALS — BP 128/74 | HR 69 | Temp 98.2°F | Resp 17 | Ht 62.0 in | Wt 155.2 lb

## 2016-02-09 DIAGNOSIS — Z23 Encounter for immunization: Secondary | ICD-10-CM | POA: Diagnosis not present

## 2016-02-09 DIAGNOSIS — Z0000 Encounter for general adult medical examination without abnormal findings: Secondary | ICD-10-CM

## 2016-02-09 DIAGNOSIS — E663 Overweight: Secondary | ICD-10-CM

## 2016-02-09 DIAGNOSIS — Z1231 Encounter for screening mammogram for malignant neoplasm of breast: Secondary | ICD-10-CM | POA: Diagnosis not present

## 2016-02-09 DIAGNOSIS — G20 Parkinson's disease: Secondary | ICD-10-CM

## 2016-02-09 DIAGNOSIS — E559 Vitamin D deficiency, unspecified: Secondary | ICD-10-CM

## 2016-02-09 DIAGNOSIS — E038 Other specified hypothyroidism: Secondary | ICD-10-CM | POA: Diagnosis not present

## 2016-02-09 LAB — CBC WITH DIFFERENTIAL/PLATELET
BASOS ABS: 0 10*3/uL (ref 0.0–0.1)
BASOS PCT: 0.4 % (ref 0.0–3.0)
Eosinophils Absolute: 0.1 10*3/uL (ref 0.0–0.7)
Eosinophils Relative: 0.9 % (ref 0.0–5.0)
HCT: 39 % (ref 36.0–46.0)
Hemoglobin: 13.1 g/dL (ref 12.0–15.0)
LYMPHS PCT: 21.3 % (ref 12.0–46.0)
Lymphs Abs: 1.7 10*3/uL (ref 0.7–4.0)
MCHC: 33.4 g/dL (ref 30.0–36.0)
MCV: 84.7 fl (ref 78.0–100.0)
MONO ABS: 0.6 10*3/uL (ref 0.1–1.0)
MONOS PCT: 7.3 % (ref 3.0–12.0)
NEUTROS ABS: 5.5 10*3/uL (ref 1.4–7.7)
Neutrophils Relative %: 70.1 % (ref 43.0–77.0)
Platelets: 270 10*3/uL (ref 150.0–400.0)
RBC: 4.61 Mil/uL (ref 3.87–5.11)
RDW: 13.1 % (ref 11.5–15.5)
WBC: 7.9 10*3/uL (ref 4.0–10.5)

## 2016-02-09 LAB — LIPID PANEL
CHOL/HDL RATIO: 4
Cholesterol: 181 mg/dL (ref 0–200)
HDL: 50.3 mg/dL (ref >39.00)
LDL CALC: 109 mg/dL — AB (ref 0–99)
NONHDL: 130.88
Triglycerides: 109 mg/dL (ref 0.0–149.0)
VLDL: 21.8 mg/dL (ref 0.0–40.0)

## 2016-02-09 LAB — BASIC METABOLIC PANEL
BUN: 20 mg/dL (ref 6–23)
CO2: 30 mEq/L (ref 19–32)
CREATININE: 0.64 mg/dL (ref 0.40–1.20)
Calcium: 10.3 mg/dL (ref 8.4–10.5)
Chloride: 102 mEq/L (ref 96–112)
GFR: 96.02 mL/min (ref >60.00)
Glucose, Bld: 105 mg/dL — ABNORMAL HIGH (ref 70–99)
Potassium: 4.1 mEq/L (ref 3.5–5.1)
SODIUM: 138 meq/L (ref 135–145)

## 2016-02-09 LAB — HEPATIC FUNCTION PANEL
ALT: 8 U/L (ref 0–35)
AST: 16 U/L (ref 0–37)
Albumin: 4.4 g/dL (ref 3.5–5.2)
Alkaline Phosphatase: 52 U/L (ref 39–117)
BILIRUBIN DIRECT: 0.1 mg/dL (ref 0.0–0.3)
BILIRUBIN TOTAL: 0.5 mg/dL (ref 0.2–1.2)
Total Protein: 7.3 g/dL (ref 6.0–8.3)

## 2016-02-09 LAB — VITAMIN D 25 HYDROXY (VIT D DEFICIENCY, FRACTURES): VITD: 10.62 ng/mL — ABNORMAL LOW (ref 30.00–100.00)

## 2016-02-09 LAB — TSH: TSH: 0.23 u[IU]/mL — AB (ref 0.35–4.50)

## 2016-02-09 NOTE — Progress Notes (Signed)
   Subjective:    Patient ID: Melissa Douglas, female    DOB: October 02, 1941, 75 y.o.   MRN: [id number]  HPI Here today for CPE.  Risk Factors: Hypothyroid- chronic problem, on Levothyroxine.  + fatigue Parkinson's- Following w/ Dr Krista Blue but will be switching to Dr Linus Mako.  Pt having orthostatic hypotension. Physical Activity: somewhat limited due to Parkinson's Depression: ongoing due to Parkinson's, 'i'm really cranky' Hearing: normal to conversational tones, mildly decreased to whispered voice ADL's: independent Cognitive: normal linear thought process, difficulty w/ memory and attention Home Safety: safe at home, lives w/ husband Height, Weight, BMI, Visual Acuity: see vitals, vision corrected to 20/20 w/ glasses Counseling: UTD on colonoscopy, due for mammo ([facility]).  Due for [facility] team reviewed and updated w/ pt Labs Ordered: See A&P Care Plan: See A&P    Review of Systems Patient reports no vision/ hearing changes, adenopathy,fever, weight change,  persistant/recurrent hoarseness , swallowing issues, chest pain, palpitations, edema, persistant/recurrent cough, hemoptysis, dyspnea (rest/exertional/paroxysmal nocturnal), gastrointestinal bleeding (melena, rectal bleeding), abdominal pain, significant heartburn, bowel changes, GU symptoms (dysuria, hematuria, incontinence), Gyn symptoms (abnormal  bleeding, pain),  syncope, numbness & tingling, hair/nail changes, abnormal bruising or bleeding.   Pt will develop red, firm, itchy skin lesions on back that resolve spontaneously. + weakness and tremor    Objective:   Physical Exam General Appearance:    Alert, cooperative, no distress, appears stated age  Head:    Normocephalic, without obvious abnormality, atraumatic  Eyes:    PERRL, conjunctiva/corneas clear, EOM's intact, fundi    benign, both eyes  Ears:    Normal TM's and external ear canals, both ears  Nose:   Nares normal, septum midline, mucosa normal, no drainage   or sinus tenderness  Throat:   Lips, mucosa, and tongue normal; teeth and gums normal  Neck:   Supple, symmetrical, trachea midline, no adenopathy;    Thyroid: no enlargement/tenderness/nodules  Back:     Symmetric, no curvature, ROM normal, no CVA tenderness  Lungs:     Clear to auscultation bilaterally, respirations unlabored  Chest Wall:    No tenderness or deformity   Heart:    Regular rate and rhythm, S1 and S2 normal, no murmur, rub   or gallop  Breast Exam:    Deferred to mammo  Abdomen:     Soft, non-tender, bowel sounds active all four quadrants,    no masses, no organomegaly  Genitalia:    Deferred  Rectal:    Extremities:   Extremities normal, atraumatic, no cyanosis or edema  Pulses:   2+ and symmetric all extremities  Skin:   Skin color, texture, turgor normal, no rashes or lesions  Lymph nodes:   Cervical, supraclavicular, and axillary nodes normal  Neurologic:   CNII-XII intact, Parkinson's tremor bilaterally          Assessment & Plan:

## 2016-02-09 NOTE — Patient Instructions (Signed)
Follow up in 6 months to recheck thyroid We'll notify you of your lab results and make any changes if needed Keep up the good work on healthy diet and regular exercise- you look great! Please schedule your mammogram at your convenience (the order has been entered) You received the Prevnar today You are up to date on colonoscopy and will not need another- yay! Call with any questions or concerns Happy Spring!!!

## 2016-02-09 NOTE — Progress Notes (Signed)
Pre visit review using our clinic review tool, if applicable. No additional management support is needed unless otherwise documented below in the visit note. 

## 2016-02-10 ENCOUNTER — Encounter: Payer: Self-pay | Admitting: Family Medicine

## 2016-02-10 ENCOUNTER — Other Ambulatory Visit: Payer: Self-pay

## 2016-02-10 MED ORDER — LEVOTHYROXINE SODIUM 100 MCG PO TABS: 100.0000 ug | ORAL_TABLET | Freq: Every day | ORAL | Status: DC

## 2016-02-10 MED ORDER — VITAMIN D (ERGOCALCIFEROL) 1.25 MG (50000 UNIT) PO CAPS: 50000.0000 [IU] | ORAL_CAPSULE | ORAL | Status: DC

## 2016-02-13 NOTE — Assessment & Plan Note (Signed)
Pt's PE unchanged from previous.  Due for mammo- order entered.  UTD on colonoscopy.  Prevnar given today.  Check labs.  Anticipatory guidance provided.

## 2016-02-13 NOTE — Assessment & Plan Note (Signed)
Ongoing issue for pt.  She is not able to exercise regularly due to her Parkinson's.  Stressed need for healthy diet.  Check labs to risk stratify.  Will follow.

## 2016-02-13 NOTE — Assessment & Plan Note (Signed)
Pt has hx of this.  Check labs.  Replete prn. 

## 2016-02-13 NOTE — Assessment & Plan Note (Signed)
Chronic problem.  Pt having difficulty tolerating Parkinson's meds.  Is in process of switching care from Dr Krista Blue to Dr Linus Mako.  Will follow along and assist as able.

## 2016-02-13 NOTE — Assessment & Plan Note (Signed)
Chronic problem.  + fatigue.  Check labs and adjust meds prn.

## 2016-02-21 ENCOUNTER — Ambulatory Visit (HOSPITAL_BASED_OUTPATIENT_CLINIC_OR_DEPARTMENT_OTHER)
Admission: RE | Admit: 2016-02-21 | Discharge: 2016-02-21 | Disposition: A | Discharge status: Home / Self Care | Payer: Medicare Other | Source: Ambulatory Visit | Attending: Family Medicine | Admitting: Family Medicine

## 2016-02-21 ENCOUNTER — Other Ambulatory Visit: Payer: Self-pay | Admitting: Neurology

## 2016-02-21 DIAGNOSIS — Z1231 Encounter for screening mammogram for malignant neoplasm of breast: Secondary | ICD-10-CM | POA: Insufficient documentation

## 2016-03-05 ENCOUNTER — Other Ambulatory Visit: Payer: Self-pay | Admitting: Neurology

## 2016-03-12 ENCOUNTER — Telehealth: Payer: Self-pay | Admitting: Neurology

## 2016-03-12 NOTE — Telephone Encounter (Signed)
Please find out more details.

## 2016-03-12 NOTE — Telephone Encounter (Signed)
August with Dr. Lanny Hurst McDonald's office is calling and would like to know what antibiotic to give the patient for her surgery.  Please call.

## 2016-03-13 NOTE — Telephone Encounter (Signed)
Ok, per Dr. Krista Blue, to take Amoxicillin.  Called and informed August at Dr. Roxy Manns office.

## 2016-03-13 NOTE — Telephone Encounter (Signed)
Pt's husband called to see if pt could take Amoxicillin. Pt had a root canal done yesterday with Dr. Sherryle Lis. Please call August Stepp at [phone] ( office number).

## 2016-03-20 ENCOUNTER — Other Ambulatory Visit: Payer: Medicare Other

## 2016-03-20 ENCOUNTER — Other Ambulatory Visit (INDEPENDENT_AMBULATORY_CARE_PROVIDER_SITE_OTHER): Payer: Medicare Other

## 2016-03-20 DIAGNOSIS — E039 Hypothyroidism, unspecified: Secondary | ICD-10-CM | POA: Diagnosis not present

## 2016-03-20 LAB — TSH: TSH: 0.43 u[IU]/mL (ref 0.35–4.50)

## 2016-03-22 ENCOUNTER — Other Ambulatory Visit: Payer: Medicare Other

## 2016-04-05 ENCOUNTER — Ambulatory Visit: Payer: Medicare Other | Admitting: Neurology

## 2016-04-07 ENCOUNTER — Other Ambulatory Visit: Payer: Self-pay | Admitting: Family Medicine

## 2016-04-10 ENCOUNTER — Other Ambulatory Visit: Payer: Self-pay | Admitting: General Practice

## 2016-04-10 ENCOUNTER — Encounter: Payer: Self-pay | Admitting: Family Medicine

## 2016-04-10 NOTE — Telephone Encounter (Signed)
Medication filled to pharmacy as requested.   

## 2016-04-19 ENCOUNTER — Encounter (HOSPITAL_COMMUNITY): Payer: Self-pay | Admitting: Emergency Medicine

## 2016-04-19 ENCOUNTER — Emergency Department (HOSPITAL_COMMUNITY)
Admission: EM | Admit: 2016-04-19 | Discharge: 2016-04-20 | Disposition: A | Discharge status: Home / Self Care | Payer: Medicare Other | Attending: Emergency Medicine | Admitting: Emergency Medicine

## 2016-04-19 ENCOUNTER — Emergency Department (HOSPITAL_COMMUNITY): Payer: Medicare Other

## 2016-04-19 DIAGNOSIS — Z8639 Personal history of other endocrine, nutritional and metabolic disease: Secondary | ICD-10-CM | POA: Insufficient documentation

## 2016-04-19 DIAGNOSIS — Z8709 Personal history of other diseases of the respiratory system: Secondary | ICD-10-CM | POA: Diagnosis not present

## 2016-04-19 DIAGNOSIS — Z8669 Personal history of other diseases of the nervous system and sense organs: Secondary | ICD-10-CM | POA: Diagnosis not present

## 2016-04-19 DIAGNOSIS — G8929 Other chronic pain: Secondary | ICD-10-CM | POA: Diagnosis not present

## 2016-04-19 DIAGNOSIS — Z79899 Other long term (current) drug therapy: Secondary | ICD-10-CM | POA: Insufficient documentation

## 2016-04-19 DIAGNOSIS — R0789 Other chest pain: Secondary | ICD-10-CM | POA: Diagnosis not present

## 2016-04-19 DIAGNOSIS — Z87891 Personal history of nicotine dependence: Secondary | ICD-10-CM | POA: Insufficient documentation

## 2016-04-19 DIAGNOSIS — Z86010 Personal history of colonic polyps: Secondary | ICD-10-CM | POA: Insufficient documentation

## 2016-04-19 DIAGNOSIS — G20 Parkinson's disease: Secondary | ICD-10-CM | POA: Diagnosis not present

## 2016-04-19 DIAGNOSIS — Z8739 Personal history of other diseases of the musculoskeletal system and connective tissue: Secondary | ICD-10-CM | POA: Insufficient documentation

## 2016-04-19 DIAGNOSIS — R079 Chest pain, unspecified: Secondary | ICD-10-CM

## 2016-04-19 LAB — BASIC METABOLIC PANEL
Anion gap: 7 (ref 5–15)
BUN: 20 mg/dL (ref 6–20)
CO2: 26 mmol/L (ref 22–32)
CREATININE: 0.69 mg/dL (ref 0.44–1.00)
Calcium: 10.2 mg/dL (ref 8.9–10.3)
Chloride: 105 mmol/L (ref 101–111)
Glucose, Bld: 110 mg/dL — ABNORMAL HIGH (ref 65–99)
Potassium: 4.7 mmol/L (ref 3.5–5.1)
SODIUM: 138 mmol/L (ref 135–145)

## 2016-04-19 LAB — I-STAT TROPONIN, ED: Troponin i, poc: 0 ng/mL (ref 0.00–0.08)

## 2016-04-19 LAB — CBC
HEMATOCRIT: 38.5 % (ref 36.0–46.0)
Hemoglobin: 12.5 g/dL (ref 12.0–15.0)
MCH: 27.6 pg (ref 26.0–34.0)
MCHC: 32.5 g/dL (ref 30.0–36.0)
MCV: 85 fL (ref 78.0–100.0)
Platelets: 315 10*3/uL (ref 150–400)
RBC: 4.53 MIL/uL (ref 3.87–5.11)
RDW: 13.5 % (ref 11.5–15.5)
WBC: 9.8 10*3/uL (ref 4.0–10.5)

## 2016-04-19 LAB — D-DIMER, QUANTITATIVE (NOT AT ARMC)

## 2016-04-19 NOTE — Discharge Instructions (Signed)
Nonspecific Chest Pain  °Chest pain can be caused by many different conditions. There is always a chance that your pain could be related to something serious, such as a heart attack or a blood clot in your lungs. Chest pain can also be caused by conditions that are not life-threatening. If you have chest pain, it is very important to follow up with your health care provider. °CAUSES  °Chest pain can be caused by: °· Heartburn. °· Pneumonia or bronchitis. °· Anxiety or stress. °· Inflammation around your heart (pericarditis) or lung (pleuritis or pleurisy). °· A blood clot in your lung. °· A collapsed lung (pneumothorax). It can develop suddenly on its own (spontaneous pneumothorax) or from trauma to the chest. °· Shingles infection (varicella-zoster virus). °· Heart attack. °· Damage to the bones, muscles, and cartilage that make up your chest wall. This can include: °¨ Bruised bones due to injury. °¨ Strained muscles or cartilage due to frequent or repeated coughing or overwork. °¨ Fracture to one or more ribs. °¨ Sore cartilage due to inflammation (costochondritis). °RISK FACTORS  °Risk factors for chest pain may include: °· Activities that increase your risk for trauma or injury to your chest. °· Respiratory infections or conditions that cause frequent coughing. °· Medical conditions or overeating that can cause heartburn. °· Heart disease or family history of heart disease. °· Conditions or health behaviors that increase your risk of developing a blood clot. °· Having had chicken pox (varicella zoster). °SIGNS AND SYMPTOMS °Chest pain can feel like: °· Burning or tingling on the surface of your chest or deep in your chest. °· Crushing, pressure, aching, or squeezing pain. °· Dull or sharp pain that is worse when you move, cough, or take a deep breath. °· Pain that is also felt in your back, neck, shoulder, or arm, or pain that spreads to any of these areas. °Your chest pain may come and go, or it may stay  constant. °DIAGNOSIS °Lab tests or other studies may be needed to find the cause of your pain. Your health care provider may have you take a test called an ambulatory ECG (electrocardiogram). An ECG records your heartbeat patterns at the time the test is performed. You may also have other tests, such as: °· Transthoracic echocardiogram (TTE). During echocardiography, sound waves are used to create a picture of all of the heart structures and to look at how blood flows through your heart. °· Transesophageal echocardiogram (TEE). This is a more advanced imaging test that obtains images from inside your body. It allows your health care provider to see your heart in finer detail. °· Cardiac monitoring. This allows your health care provider to monitor your heart rate and rhythm in real time. °· Holter monitor. This is a portable device that records your heartbeat and can help to diagnose abnormal heartbeats. It allows your health care provider to track your heart activity for several days, if needed. °· Stress tests. These can be done through exercise or by taking medicine that makes your heart beat more quickly. °· Blood tests. °· Imaging tests. °TREATMENT  °Your treatment depends on what is causing your chest pain. Treatment may include: °· Medicines. These may include: °¨ Acid blockers for heartburn. °¨ Anti-inflammatory medicine. °¨ Pain medicine for inflammatory conditions. °¨ Antibiotic medicine, if an infection is present. °¨ Medicines to dissolve blood clots. °¨ Medicines to treat coronary artery disease. °· Supportive care for conditions that do not require medicines. This may include: °¨ Resting. °¨ Applying heat   or cold packs to injured areas. °¨ Limiting activities until pain decreases. °HOME CARE INSTRUCTIONS °· If you were prescribed an antibiotic medicine, finish it all even if you start to feel better. °· Avoid any activities that bring on chest pain. °· Do not use any tobacco products, including  cigarettes, chewing tobacco, or electronic cigarettes. If you need help quitting, ask your health care provider. °· Do not drink alcohol. °· Take medicines only as directed by your health care provider. °· Keep all follow-up visits as directed by your health care provider. This is important. This includes any further testing if your chest pain does not go away. °· If heartburn is the cause for your chest pain, you may be told to keep your head raised (elevated) while sleeping. This reduces the chance that acid will go from your stomach into your esophagus. °· Make lifestyle changes as directed by your health care provider. These may include: °¨ Getting regular exercise. Ask your health care provider to suggest some activities that are safe for you. °¨ Eating a heart-healthy diet. A registered dietitian can help you to learn healthy eating options. °¨ Maintaining a healthy weight. °¨ Managing diabetes, if necessary. °¨ Reducing stress. °SEEK MEDICAL CARE IF: °· Your chest pain does not go away after treatment. °· You have a rash with blisters on your chest. °· You have a fever. °SEEK IMMEDIATE MEDICAL CARE IF:  °· Your chest pain is worse. °· You have an increasing cough, or you cough up blood. °· You have severe abdominal pain. °· You have severe weakness. °· You faint. °· You have chills. °· You have sudden, unexplained chest discomfort. °· You have sudden, unexplained discomfort in your arms, back, neck, or jaw. °· You have shortness of breath at any time. °· You suddenly start to sweat, or your skin gets clammy. °· You feel nauseous or you vomit. °· You suddenly feel light-headed or dizzy. °· Your heart begins to beat quickly, or it feels like it is skipping beats. °These symptoms may represent a serious problem that is an emergency. Do not wait to see if the symptoms will go away. Get medical help right away. Call your local emergency services (911 in the [place] Do not drive yourself to the hospital. °  °This  information is not intended to replace advice given to you by your health care provider. Make sure you discuss any questions you have with your health care provider. °  °Document Released: 08/08/2005 Document Revised: 11/19/2014 Document Reviewed: 06/04/2014 °Elsevier Interactive Patient Education ©2016 [employer] ° °

## 2016-04-19 NOTE — ED Notes (Signed)
Pt states after dinner around 630 she had a sudden onset of constant sharp central chest pain that lasted for 2 hrs and has now subsided. Pt took 324mg  ASA before coming to ED. Pt has hx parkinsons.

## 2016-04-19 NOTE — ED Notes (Signed)
PT TRANSPORTED TO XRAY 

## 2016-04-19 NOTE — ED Provider Notes (Signed)
CSN: [id number]     Arrival date & time 04/19/16  2012 History   First MD Initiated Contact with Patient 04/19/16 2119     Chief Complaint  Patient presents with  . Chest Pain    Patient is a 75 y.o. female presenting with chest pain. The history is provided by the patient.  Chest Pain Pain location:  L chest Pain quality: sharp   Pain radiates to:  Does not radiate Pain radiates to the back: no   Pain severity:  Severe Onset quality:  Sudden Duration:  2 hours Timing:  Constant Progression:  Resolved Chronicity:  New Context: at rest   Relieved by:  None tried (Resolved spontaneously) Worsened by:  Nothing tried Ineffective treatments:  None tried Associated symptoms: no altered mental status, no cough, no diaphoresis, no fever, no lower extremity edema, no nausea, no numbness, no shortness of breath, no syncope and not vomiting   Risk factors: no coronary artery disease, no diabetes mellitus, no high cholesterol, no hypertension, not female and no smoking     Past Medical History  Diagnosis Date  . Chronic joint pain   . Hypothyroidism   . Osteopenia   . Parkinson's disease ([place])   . Cataract Right eye    HAD SURGERY  . Multiple nasal polyps   . Hx of adenomatous colonic polyps 08/18/2014  . Parkinson disease [facility])    Past Surgical History  Procedure Laterality Date  . Abdominal hysterectomy      ovaries remain  . Appendectomy    . Tonsillectomy    . Cataract extraction     No family history on file. Social History  Substance Use Topics  . Smoking status: Former Smoker    Types: Cigarettes    Quit date: 01/01/2001  . Smokeless tobacco: Never Used  . Alcohol Use: 7.2 - 8.4 oz/week    12-14 Glasses of wine per week     Comment: occ.   OB History    No data available     Review of Systems  Constitutional: Negative for fever and diaphoresis.  HENT: Negative for congestion.   Eyes: Negative for redness.  Respiratory: Negative for cough and shortness of  breath.   Cardiovascular: Positive for chest pain. Negative for syncope.  Gastrointestinal: Negative for nausea and vomiting.  Genitourinary: Negative for flank pain.  Musculoskeletal: Negative for neck stiffness.  Skin: Negative for rash.  Neurological: Positive for tremors (chronic, hx of Parkinson's). Negative for numbness.      Allergies  Carbidopa-levodopa; Codeine; Ether; Gabapentin; and Iodine  Home Medications   Prior to Admission medications   Medication Sig Start Date End Date Taking? Authorizing Provider  acetaminophen (TYLENOL) 500 MG tablet Take 500-1,000 mg by mouth every 6 (six) hours as needed for pain.     Historical Provider, MD  Carbidopa 25 MG tablet TAKE 1 TABLET BY MOUTH THREE TIMES DAILY Patient taking differently: TAKE 1 TABLET BY MOUTH four TIMES DAILY 11/14/15   Marcial Pacas, MD  carbidopa-levodopa (SINEMET) 25-100 MG tablet Take 1 tablet by mouth 3 (three) times daily. Patient taking differently: Take 0.5 tablets by mouth 4 (four) times daily.  01/03/16   Marcial Pacas, MD  DiphenhydrAMINE HCl, Sleep, (SOMINEX PO) Take by mouth at bedtime as needed.    Historical Provider, MD  QUEtiapine (SEROQUEL) 25 MG tablet 1/2 to one tab po every night Patient not taking: Reported on 02/09/2016 01/03/16   Marcial Pacas, MD  rasagiline (AZILECT) 1 MG TABS tablet Take  1 tablet (1 mg total) by mouth daily. 01/03/16   Marcial Pacas, MD  Vitamin D, Ergocalciferol, (DRISDOL) 50000 units CAPS capsule Take 1 capsule (50,000 Units total) by mouth every 7 (seven) days. 02/10/16   Midge Minium, MD   BP 147/79 mmHg  Pulse 71  Temp(Src) 97.8 F (36.6 C) (Oral)  Resp 15  SpO2 98% Physical Exam  Constitutional: She is oriented to person, place, and time. She appears well-developed and well-nourished. No distress.  HENT:  Head: Normocephalic and atraumatic.  Right Ear: External ear normal.  Left Ear: External ear normal.  Mouth/Throat: Oropharynx is clear and moist.  Neck: Normal range of  motion.  Cardiovascular: Normal rate, regular rhythm and intact distal pulses.   Pulses:      Radial pulses are 2+ on the right side, and 2+ on the left side.  Pulmonary/Chest: Effort normal and breath sounds normal.  Abdominal: Soft. Bowel sounds are normal. She exhibits no distension. There is no tenderness.  Neurological: She is alert and oriented to person, place, and time.  Mild tremor of bilateral hands, moves all extremities spontaneously and to command  Skin: Skin is warm and dry. She is not diaphoretic. No pallor.  Psychiatric: She has a normal mood and affect.  Vitals reviewed.   ED Course  Procedures (including critical care time) Labs Review Labs Reviewed  BASIC METABOLIC PANEL - Abnormal; Notable for the following:    Glucose, Bld 110 (*)    All other components within normal limits  CBC  D-DIMER, QUANTITATIVE (NOT AT [facility])  Randolm Idol, ED    Imaging Review Dg Chest 2 View  04/19/2016  CLINICAL DATA:  Chest pain EXAM: CHEST  2 VIEW COMPARISON:  None. FINDINGS: Normal heart size and mediastinal contours. Mild interstitial coarsening. There is no edema, consolidation, effusion, or pneumothorax. No acute osseous finding. IMPRESSION: No active cardiopulmonary disease. Electronically Signed   By: Monte Fantasia M.D.   On: 04/19/2016 21:31   I have personally reviewed and evaluated these images and lab results as part of my medical decision-making.   EKG Interpretation   Date/Time:  Thursday April 19 2016 20:26:08 EDT Ventricular Rate:  82 PR Interval:  51 QRS Duration: 89 QT Interval:  376 QTC Calculation: 439 R Axis:   65 Text Interpretation:  Sinus rhythm Short PR interval When compared with  ECG of 01/09/2013, No significant change was found Confirmed by [facility]  MD,  DAVID ([id number]) on 04/19/2016 8:31:00 PM      MDM   Final diagnoses:  Chest pain, unspecified chest pain type   75 y.o. female with past medical history of Parkinson's, hypothyroidism  presents to the ED with sharp left-sided chest pain that occurred twice today. It has since resolved spontaneously. No associated nausea, vomiting, diaphoresis, near syncope, syncope, shortness of breath, abdominal pain. She has otherwise been feeling healthy and well. No family history of heart disease. She denies a history of personal heart disease, hypertension, diabetes, hyperlipidemia. No history of CVA or peripheral artery disease. She does not smoke.  Considered ACS, PE, pneumothorax. Chest x-ray unremarkable. EKG shows no acute ischemic changes. CBC, BMP, initial troponin all within normal limits. D-dimer is not elevated.  Will plan for serial troponin.  Repeat troponin is still 0.00. Serial EKG showed no change from prior, no ischemia. She has not had any further recurrence of pain and continues to be pain-free.  Stricture precautions were discussed including writing. She is to follow-up with her PCP tomorrow.  The patient and her husband felt very comfortable with this plan to go home and all questions were answered.  Case managed in conjunction with my attending, Dr. Roxanne Mins.   Berenice Primas, MD [id number]  Delora Fuel, MD [id number]

## 2016-04-20 LAB — I-STAT TROPONIN, ED: TROPONIN I, POC: 0 ng/mL (ref 0.00–0.08)

## 2016-04-21 ENCOUNTER — Telehealth: Payer: Self-pay | Admitting: Neurology

## 2016-04-21 NOTE — Telephone Encounter (Signed)
I returned call from answering service from patient`s husband mark who stated her tremors were worse and he gave her a sleeping pill and she was resting. She is on sinemet 25/100 half tablet four times daily and has had trouble tolerating a whole tablet in past due to stomach upset. She has a appointment with Dr Linus Mako at [facility] for second opinion next Wednesday. I recommend she try inceasing sinemet to full tablet again with a snack if tolerated . Call back on Monday with update and keep Dr siddiqui`s appointment.He voiced undertanding

## 2016-05-01 DIAGNOSIS — G20 Parkinson's disease: Secondary | ICD-10-CM | POA: Diagnosis not present

## 2016-05-02 ENCOUNTER — Other Ambulatory Visit: Payer: Self-pay | Admitting: Family Medicine

## 2016-05-02 ENCOUNTER — Encounter: Payer: Self-pay | Admitting: General Practice

## 2016-05-02 NOTE — Telephone Encounter (Signed)
Med denied, pt needs to continue OTC vitamin D 2000iu daily.  

## 2016-06-04 DIAGNOSIS — G20 Parkinson's disease: Secondary | ICD-10-CM | POA: Diagnosis not present

## 2016-06-06 ENCOUNTER — Telehealth: Payer: Self-pay | Admitting: Family Medicine

## 2016-06-06 NOTE — Telephone Encounter (Signed)
Start 2 tablespoons Milk of Mag in 4 oz warm prune juice.  Repeat in 2 hrs if not effective

## 2016-06-06 NOTE — Telephone Encounter (Signed)
Husband calling, states that pt is having trouble with BM and asking what could pt do to help.

## 2016-06-06 NOTE — Telephone Encounter (Signed)
Spoke with pt husband, and he informed pt has been backed up for about a week. States that he is concerned about anal leakage due to her parkinson's. Pt husband was advised to speak about that second concern with parkinson's specialist in August. Please advise on the constipation.

## 2016-06-06 NOTE — Telephone Encounter (Signed)
Pt husband informed and expressed an understanding.

## 2016-06-29 ENCOUNTER — Ambulatory Visit: Payer: Medicare Other | Attending: Neurology | Admitting: Physical Therapy

## 2016-06-29 DIAGNOSIS — R293 Abnormal posture: Secondary | ICD-10-CM

## 2016-06-29 DIAGNOSIS — M6281 Muscle weakness (generalized): Secondary | ICD-10-CM

## 2016-06-29 DIAGNOSIS — R29818 Other symptoms and signs involving the nervous system: Secondary | ICD-10-CM

## 2016-06-29 DIAGNOSIS — R2689 Other abnormalities of gait and mobility: Secondary | ICD-10-CM

## 2016-06-30 NOTE — Therapy (Signed)
[facility] [address] [place], [place], [address] Phone: [phone]   Fax:  [phone]  Physical Therapy Evaluation  Patient Details  Name: Melissa Douglas MRN: [id number] Date of Birth: 05/20/1941 Referring Provider: Linus Mako  Encounter Date: 06/29/2016      PT End of Session - 06/30/16 1501    Visit Number 1   Number of Visits 25   Date for PT Re-Evaluation 08/28/16   Authorization Type Medicare/ BCBS secondary-GCODE every 10th visit   PT Start Time 1018   PT Stop Time 1109   PT Time Calculation (min) 51 min   Activity Tolerance Patient tolerated treatment well   Behavior During Therapy [facility] for tasks assessed/performed      Past Medical History:  Diagnosis Date  . Cataract Right eye   HAD SURGERY  . Chronic joint pain   . Hx of adenomatous colonic polyps 08/18/2014  . Hypothyroidism   . Multiple nasal polyps   . Osteopenia   . Parkinson disease ([place])   . Parkinson's disease [facility])     Past Surgical History:  Procedure Laterality Date  . ABDOMINAL HYSTERECTOMY     ovaries remain  . APPENDECTOMY    . CATARACT EXTRACTION    . TONSILLECTOMY      There were no vitals filed for this visit.       Subjective Assessment - 06/29/16 1022    Subjective Pt is a 75 year old female with history of Parkinson's disease.  Pt reports difficulty with balance, feels walking is slowed, smaller steps.  No reproted falls.  Has difficulty with getting up from chair, getting out of bed.   Patient is accompained by: Family member  Husband   Pertinent History Parkinson's diagnosis 3-4 years ago.   Patient Stated Goals Pt's goals fro therapy is to work on balance, not feeling that I will fall.   Currently in Pain? Other (Comment)  Pain comes and goes-sometimes in back and neck/back of head            [facility] PT Assessment - 06/29/16 1028      Assessment   Medical Diagnosis Parkinson's disease   Referring  Provider Siddiqui     Precautions   Precautions Fall     Balance Screen   Has the patient fallen in the past 6 months No   Has the patient had a decrease in activity level because of a fear of falling?  Yes   Is the patient reluctant to leave their home because of a fear of falling?  No     Home Social worker Private residence   Living Arrangements Spouse/significant other   Available Help at Discharge Family   Type of [place] to enter   Entrance Stairs-Number of Steps 3   Entrance Stairs-Rails Right;Left;Can reach both   [place] Two level;Able to live on main level with bedroom/bathroom   [place] - single point;Grab bars - toilet;Grab bars - tub/shower     Prior Function   Level of Independence Needs assistance with ADLs;Independent with household mobility without device  Husband assists with ADLs   Leisure Used to paint for hours on end; has used Clinical research associate at [employer] in past for walking activities.  Enjoyed playing piano.     Posture/Postural Control   Posture/Postural Control Postural limitations   Postural Limitations Rounded Shoulders;Forward head   Posture Comments Tremor of bilateral UEs  Tone   Assessment Location Other (comment)     Tone Assessment - Other   Other Tone Location Comments increased ridigity noted bilateral lower extremities     ROM / Strength   AROM / PROM / Strength Strength     Strength   Overall Strength Comments Grossly tested bilateral lower extremities 3+ to 4/5     Transfers   Transfers Sit to Stand;Stand to Sit   Sit to Stand 6: Modified independent (Device/Increase time);With upper extremity assist;Without upper extremity assist;From chair/3-in-1   Five time sit to stand comments  23.06   Stand to Sit 6: Modified independent (Device/Increase time);With upper extremity assist;Without upper extremity assist;To chair/3-in-1     Ambulation/Gait   Ambulation/Gait Yes    Ambulation/Gait Assistance 5: Supervision   Ambulation Distance (Feet) 200 Feet   Assistive device None   Gait Pattern Decreased arm swing - right;Step-through pattern;Decreased arm swing - left;Decreased step length - right;Shuffle;Decreased trunk rotation;Narrow base of support;Trunk flexed   Ambulation Surface Level;Indoor   Gait velocity 12.33 sec = 2.66 ft/sec   Stairs Yes   Stairs Assistance 6: Modified independent (Device/Increase time)   Stair Management Technique Two rails;Alternating pattern;Step to pattern  step to descending; not full foot placement ascending   Number of Stairs 4   Height of Stairs 6     Standardized Balance Assessment   Standardized Balance Assessment Timed Up and Go Test     Timed Up and Go Test   Normal TUG (seconds) 29.22   Manual TUG (seconds) 35.41   Cognitive TUG (seconds) 35.19     Functional Gait  Assessment   Gait assessed  Yes   Gait Level Surface Walks 20 ft, slow speed, abnormal gait pattern, evidence for imbalance or deviates 10-15 in outside of the 12 in walkway width. Requires more than 7 sec to ambulate 20 ft.  8.95 sed   Change in Gait Speed Makes only minor adjustments to walking speed, or accomplishes a change in speed with significant gait deviations, deviates 10-15 in outside the 12 in walkway width, or changes speed but loses balance but is able to recover and continue walking.   Gait with Horizontal Head Turns Performs head turns with moderate changes in gait velocity, slows down, deviates 10-15 in outside 12 in walkway width but recovers, can continue to walk.   Gait with Vertical Head Turns Performs task with moderate change in gait velocity, slows down, deviates 10-15 in outside 12 in walkway width but recovers, can continue to walk.   Gait and Pivot Turn Turns slowly, requires verbal cueing, or requires several small steps to catch balance following turn and stop  10.38 sec   Step Over Obstacle Cannot perform without assistance.    Gait with Narrow Base of Support Ambulates less than 4 steps heel to toe or cannot perform without assistance.   Gait with Eyes Closed Cannot walk 20 ft without assistance, severe gait deviations or imbalance, deviates greater than 15 in outside 12 in walkway width or will not attempt task.   Ambulating Backwards Walks 20 ft, slow speed, abnormal gait pattern, evidence for imbalance, deviates 10-15 in outside 12 in walkway width.  38.03 sec   Steps Two feet to a stair, must use rail.   Total Score 7   FGA comment: Scores <15/30 indicate increased fall risk in people with PD  PT Education - 06/30/16 1500    Education provided Yes   Education Details Discussed eval results; discussed how to address Parkinson's related functional deficitis with LSVT versus Parkinson Wellness Recovery;   discussed proceeding with functional training with PWR! techniques   Person(s) Educated Patient;Spouse   Methods Explanation   Comprehension Verbalized understanding          PT Short Term Goals - 06/30/16 1512      PT SHORT TERM GOAL #1   Title Pt will be able to perform HEP with family supervision, to address Parkinson's specific functional deficits.  TARGET 07/29/16   Time 4   Period Weeks   Status New     PT SHORT TERM GOAL #2   Title Pt will improve 5x sit<>stand to less than or equal to 18 seconds for improved transfer efficiency and safety.   Time 4   Period Weeks   Status New     PT SHORT TERM GOAL #3   Title Pt will improve TUG score to less than or equal to 23 seconds for decreased fall risk.   Time 4   Period Weeks   Status New     PT SHORT TERM GOAL #4   Title Pt will report at least 25% improvement in bed mobility/decreased caregiver assistance.   Time 4   Period Weeks     PT SHORT TERM GOAL #5   Title Pt will verbalize understanding of local Parkinson's resources.   Time 4   Period Weeks   Status New           PT Long  Term Goals - 06/30/16 1527      PT LONG TERM GOAL #1   Title Pt/husband will verbalize understanding of fall prevention within the home environment.  TARGET 08/27/16   Time 8   Period Weeks   Status New     PT LONG TERM GOAL #2   Title Pt will improve 5x sit<>stand to less than or equal to 15 seconds for improved transfer efficiency and safety.   Time 8   Period Weeks   Status New     PT LONG TERM GOAL #3   Title Pt will improve TUG score to less than or equal to 18 seconds for decreased fall risk.   Time 8   Period Weeks   Status New     PT LONG TERM GOAL #4   Title Pt will improve Functional Gait Asessment to at least 15/30 for decreased fall risk.   Time 8   Period Weeks   Status New     PT LONG TERM GOAL #5   Title Pt will ambulate at least 500 ft indoor and outdoor surfaces independently, for improved safety and efficiency with gait.   Time 8   Status New     Additional Long Term Goals   Additional Long Term Goals Yes     PT LONG TERM GOAL #6   Title Pt will verbalize plans for continued community fitness upon D/C from PT.   Time 8   Period Weeks   Status New               Plan - 06/30/16 1502    Clinical Impression Statement Pt is a 76 year old female who presents to OP PT with history of Parkinson's disease and >3 medical conditions per PMH in EPIC.  Pt presents to PT eval today with bradykinesia, rigidity, tremors, decreased strength, decreased balance, decreased timing and  coordination of gait, abnormal posture, difficulty with transfers and with bed mobility.  Pt requires assistance with ADLs and has had to stop playing the piano and painting.  Pt is at fall risk per TUG scores, Functional Gait Assessment scores.  Pt demonstrates slowed transfer abilty.  Pt would benefit from further skilled PT to address the above stated deficits and to improve functional mobility and decrease fall risk.  Pt would  benefit from large amplitude, high intensity, functional  based PWR! (Parkinson's Wellness Recovery) framework for therapy to address gait, standing balance, transfers, and bed mobility difficulties.     Rehab Potential Good   PT Frequency 3x / week   PT Duration 8 weeks  plus eval   PT Treatment/Interventions ADLs/Self Care Home Management;Functional mobility training;Gait training;Therapeutic activities;Therapeutic exercise;Balance training;Neuromuscular re-education   PT Next Visit Plan Initiate PWR! MOves in sitting, standing, and in supine (to address bed mobility dificulty), sit<>stand transfer technique, focus on amplitude and intensity of movement patterns.   Recommended Other Services May beneift from OT   Consulted and Agree with Plan of Care Patient;Family member/caregiver   Family Member Consulted Husband      Patient will benefit from skilled therapeutic intervention in order to improve the following deficits and impairments:  Abnormal gait, Decreased balance, Decreased mobility, Difficulty walking, Postural dysfunction  Visit Diagnosis: Other abnormalities of gait and mobility  Other symptoms and signs involving the nervous system  Abnormal posture  Muscle weakness (generalized)      G-Codes - Jul 26, 2016 1534    Functional Assessment Tool Used 5x sit<>stand 23.06 sec, TUG 29.22 sec, TUG cog 35.19 sec, TUG manual 35.41 sec, Functional Gait Assessment 7/30   Functional Limitation Mobility: Walking and moving around   Mobility: Walking and Moving Around Current Status [phone]) At least 40 percent but less than 60 percent impaired, limited or restricted   Mobility: Walking and Moving Around Goal Status [phone]) At least 20 percent but less than 40 percent impaired, limited or restricted       Problem List Patient Active Problem List   Diagnosis Date Noted  . Memory loss 12/13/2014  . Soft tissue mass 12/13/2014  . Recent skin changes 12/13/2014  . Hx of adenomatous colonic polyps 08/18/2014  . Fungal dermatitis 03/19/2014  .  Overweight 03/19/2014  . Cataract   . Constipation, slow transit 09/03/2012  . Seasonal allergic rhinitis 04/18/2012  . General medical examination 05/18/2011  . Elevated BP 03/13/2011  . Vaginal dryness, menopausal 03/13/2011  . Vitamin D deficiency 03/13/2011  . Hypertriglyceridemia 03/13/2011  . OSTEOPENIA 05/31/2010  . Parkinsons disease ([place]) 04/07/2010  . POSTMENOPAUSAL STATUS 04/07/2010  . 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 DRY MOUTH 03/07/2010  . 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 PARESTHESIA 01/26/2009  . Hypothyroidism 10/20/2008  . 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 06/30/2016, 3:35 PM Frazier Butt., PT [facility] [address] [place], [place], [address] Phone: [phone]   Fax:  [phone]  Name: Melissa Douglas MRN: [id number] Date of Birth: 02/25/1941

## 2016-07-03 DIAGNOSIS — R259 Unspecified abnormal involuntary movements: Secondary | ICD-10-CM | POA: Diagnosis not present

## 2016-07-03 DIAGNOSIS — R4189 Other symptoms and signs involving cognitive functions and awareness: Secondary | ICD-10-CM | POA: Diagnosis not present

## 2016-07-03 DIAGNOSIS — I951 Orthostatic hypotension: Secondary | ICD-10-CM | POA: Diagnosis not present

## 2016-07-09 ENCOUNTER — Ambulatory Visit: Payer: Medicare Other | Admitting: Physical Therapy

## 2016-07-09 DIAGNOSIS — R29818 Other symptoms and signs involving the nervous system: Secondary | ICD-10-CM

## 2016-07-09 DIAGNOSIS — R2689 Other abnormalities of gait and mobility: Secondary | ICD-10-CM

## 2016-07-09 DIAGNOSIS — R293 Abnormal posture: Secondary | ICD-10-CM

## 2016-07-09 DIAGNOSIS — M6281 Muscle weakness (generalized): Secondary | ICD-10-CM

## 2016-07-09 NOTE — Therapy (Signed)
[facility] [address] [place], [place], [address] Phone: [phone]   Fax:  [phone]  Physical Therapy Treatment  Patient Details  Name: Melissa Douglas MRN: [id number] Date of Birth: 1941-10-28 Referring Provider: Linus Mako  Encounter Date: 07/09/2016      PT End of Session - 07/09/16 1546    Visit Number 2   Number of Visits 25   Date for PT Re-Evaluation 08/28/16   Authorization Type Medicare/ BCBS secondary-GCODE every 10th visit   PT Start Time 1453   PT Stop Time 1533   PT Time Calculation (min) 40 min   Activity Tolerance Patient limited by pain  Abdominal pain   Behavior During Therapy [facility] for tasks assessed/performed      Past Medical History:  Diagnosis Date  . Cataract Right eye   HAD SURGERY  . Chronic joint pain   . Hx of adenomatous colonic polyps 08/18/2014  . Hypothyroidism   . Multiple nasal polyps   . Osteopenia   . Parkinson disease ([place])   . Parkinson's disease [facility])     Past Surgical History:  Procedure Laterality Date  . ABDOMINAL HYSTERECTOMY     ovaries remain  . APPENDECTOMY    . CATARACT EXTRACTION    . TONSILLECTOMY      There were no vitals filed for this visit.      Subjective Assessment - 07/09/16 1456    Subjective   No reported falls.  Pt's stomach is upset since after lunch.     Patient is accompained by: Family member  Husband   Pertinent History Parkinson's diagnosis 3-4 years ago.   Patient Stated Goals Pt's goals fro therapy is to work on balance, not feeling that I will fall.   Currently in Pain? Yes   Pain Score 5    Pain Location Abdomen   Pain Orientation Mid   Pain Descriptors / Indicators Discomfort   Pain Type Acute pain   Pain Onset More than a month ago   Pain Frequency Intermittent                         [facility] Adult PT Treatment/Exercise - 07/09/16 0001      Transfers   Transfers Sit to Stand;Stand to Sit   Sit to  Stand 4: Min assist;4: Min guard   Sit to Stand Details Tactile cues for initiation;Tactile cues for sequencing;Tactile cues for weight shifting;Tactile cues for posture   Sit to Stand Details (indicate cue type and reason) Worked on sit<>stands without UE support   Stand to Sit 4: Min guard   Number of Reps 10 reps           PWR [facility]) - 07/09/16 1539 SITTING   PWR! Up x15   PWR! Rock x10   PWR! Twist x10   PWR! Step x5   Comments Cues for posture, sequncing, and amplifying movement              PT Education - 07/09/16 1544    Education provided Yes   Education Details Initiated PWR! Moves HEP in sitting.   Person(s) Educated Patient;Spouse   Methods Explanation;Demonstration;Tactile cues;Verbal cues;Handout   Comprehension Verbalized understanding;Returned demonstration;Verbal cues required;Tactile cues required;Need further instruction          PT Short Term Goals - 06/30/16 1512      PT SHORT TERM GOAL #1   Title Pt will be able to perform HEP with family supervision,  to address Parkinson's specific functional deficits.  TARGET 07/29/16   Time 4   Period Weeks   Status New     PT SHORT TERM GOAL #2   Title Pt will improve 5x sit<>stand to less than or equal to 18 seconds for improved transfer efficiency and safety.   Time 4   Period Weeks   Status New     PT SHORT TERM GOAL #3   Title Pt will improve TUG score to less than or equal to 23 seconds for decreased fall risk.   Time 4   Period Weeks   Status New     PT SHORT TERM GOAL #4   Title Pt will report at least 25% improvement in bed mobility/decreased caregiver assistance.   Time 4   Period Weeks     PT SHORT TERM GOAL #5   Title Pt will verbalize understanding of local Parkinson's resources.   Time 4   Period Weeks   Status New           PT Long Term Goals - 06/30/16 1527      PT LONG TERM GOAL #1   Title Pt/husband will verbalize understanding of fall prevention within the home  environment.  TARGET 08/27/16   Time 8   Period Weeks   Status New     PT LONG TERM GOAL #2   Title Pt will improve 5x sit<>stand to less than or equal to 15 seconds for improved transfer efficiency and safety.   Time 8   Period Weeks   Status New     PT LONG TERM GOAL #3   Title Pt will improve TUG score to less than or equal to 18 seconds for decreased fall risk.   Time 8   Period Weeks   Status New     PT LONG TERM GOAL #4   Title Pt will improve Functional Gait Asessment to at least 15/30 for decreased fall risk.   Time 8   Period Weeks   Status New     PT LONG TERM GOAL #5   Title Pt will ambulate at least 500 ft indoor and outdoor surfaces independently, for improved safety and efficiency with gait.   Time 8   Status New     Additional Long Term Goals   Additional Long Term Goals Yes     PT LONG TERM GOAL #6   Title Pt will verbalize plans for continued community fitness upon D/C from PT.   Time 8   Period Weeks   Status New               Plan - 07/09/16 1548    Clinical Impression Statement Pt had difficulty participating today due to abdominal pain.  Husband reports that she has a h/o nausea sometimes with medication.  Initiated PWR! Moves in sitting for HEP. Pt was able to perform with max cues for technique and posture at supervision level.                                                                                   Rehab Potential Good   PT Frequency 3x / week  PT Duration 8 weeks  plus eval   PT Treatment/Interventions ADLs/Self Care Home Management;Functional mobility training;Gait training;Therapeutic activities;Therapeutic exercise;Balance training;Neuromuscular re-education   PT Next Visit Plan Review PWR! MOves in sitting; initate in standing, and in supine (to address bed mobility dificulty), sit<>stand transfer technique, focus on amplitude and intensity of movement patterns.   Consulted and Agree with Plan of Care Patient;Family  member/caregiver   Family Member Consulted Husband      Patient will benefit from skilled therapeutic intervention in order to improve the following deficits and impairments:  Abnormal gait, Decreased balance, Decreased mobility, Difficulty walking, Postural dysfunction  Visit Diagnosis: Other abnormalities of gait and mobility  Other symptoms and signs involving the nervous system  Abnormal posture  Muscle weakness (generalized)     Problem List Patient Active Problem List   Diagnosis Date Noted  . Memory loss 12/13/2014  . Soft tissue mass 12/13/2014  . Recent skin changes 12/13/2014  . Hx of adenomatous colonic polyps 08/18/2014  . Fungal dermatitis 03/19/2014  . Overweight 03/19/2014  . Cataract   . Constipation, slow transit 09/03/2012  . Seasonal allergic rhinitis 04/18/2012  . General medical examination 05/18/2011  . Elevated BP 03/13/2011  . Vaginal dryness, menopausal 03/13/2011  . Vitamin D deficiency 03/13/2011  . Hypertriglyceridemia 03/13/2011  . OSTEOPENIA 05/31/2010  . Parkinsons disease ([place]) 04/07/2010  . POSTMENOPAUSAL STATUS 04/07/2010  . 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 DRY MOUTH 03/07/2010  . 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 PARESTHESIA 01/26/2009  . Hypothyroidism 10/20/2008  . 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 [place], [place], [address] Phone: [phone]   Fax:  [phone]  Name: Melissa Douglas MRN: [id number] Date of Birth: 1941-08-25

## 2016-07-10 DIAGNOSIS — R259 Unspecified abnormal involuntary movements: Secondary | ICD-10-CM | POA: Diagnosis not present

## 2016-07-11 ENCOUNTER — Ambulatory Visit: Payer: Medicare Other | Admitting: Physical Therapy

## 2016-07-11 DIAGNOSIS — R293 Abnormal posture: Secondary | ICD-10-CM

## 2016-07-11 DIAGNOSIS — R2689 Other abnormalities of gait and mobility: Secondary | ICD-10-CM | POA: Diagnosis not present

## 2016-07-11 DIAGNOSIS — M6281 Muscle weakness (generalized): Secondary | ICD-10-CM | POA: Diagnosis not present

## 2016-07-11 DIAGNOSIS — R29818 Other symptoms and signs involving the nervous system: Secondary | ICD-10-CM

## 2016-07-11 NOTE — Therapy (Signed)
[facility] [address] [place], [place], [address] Phone: [phone]   Fax:  [phone]  Physical Therapy Treatment  Patient Details  Name: Melissa Douglas MRN: [id number] Date of Birth: [birth year] Referring Provider: Linus Mako  Encounter Date: 07/11/2016      PT End of Session - 07/11/16 1508    Visit Number 3   Number of Visits 25   Date for PT Re-Evaluation 08/28/16   Authorization Type Medicare/ BCBS secondary-GCODE every 10th visit   PT Start Time 1320   PT Stop Time 1407   PT Time Calculation (min) 47 min   Activity Tolerance Other (comment)  limited by decreased processing and initiation as well as need for continuous verbal, visual and tactile cues   Behavior During Therapy [facility] for tasks assessed/performed      Past Medical History:  Diagnosis Date  . Cataract Right eye   HAD SURGERY  . Chronic joint pain   . Hx of adenomatous colonic polyps 08/18/2014  . Hypothyroidism   . Multiple nasal polyps   . Osteopenia   . Parkinson disease ([place])   . Parkinson's disease [facility])     Past Surgical History:  Procedure Laterality Date  . ABDOMINAL HYSTERECTOMY     ovaries remain  . APPENDECTOMY    . CATARACT EXTRACTION    . TONSILLECTOMY      There were no vitals filed for this visit.      Subjective Assessment - 07/11/16 1501    Subjective Denies falls.  Reports not feeling well the past several days-"Upset stomach" which husband reports is normal for pt.  Has had a hard time with medications and achieving proper doses due to nausea.   Patient is accompained by: Family member  husband   Pertinent History Parkinson's diagnosis 3-4 years ago.   Patient Stated Goals Pt's goals fro therapy is to work on balance, not feeling that I will fall.   Currently in Pain? No/denies                         [facility] Adult PT Treatment/Exercise - 07/11/16 0001      Bed Mobility   Bed Mobility Left  Sidelying to Sit;Rolling Right;Rolling Left;Sit to Sidelying Left  Pt needing significant cues for technique with bed mobility.   Rolling Right 4: Min guard   Rolling Left 4: Min guard   Left Sidelying to Sit 4: Min guard   Sit to Sidelying Left 4: Min guard     Transfers   Transfers Sit to Stand;Stand to Sit   Sit to Stand 4: Min assist;4: Min guard   Sit to Stand Details Tactile cues for initiation;Tactile cues for sequencing;Tactile cues for weight shifting;Tactile cues for posture     Discussed option of bed rail with pt's husband as well as how to best assist pt with bed mobility without pulling on her UE's.       PWR [facility]) - 07/11/16 1503    PWR! exercises Moves in sitting;Moves in supine   PWR! Up 10   PWR! Rock 10   Comments Needs significant verbal, visual and tactile cues and rest breaks in between due to c/o dizziness and nausea.  Pt wanting to continue as she reports feeling better with increased reps.  Pt very delayed in verbal responses as well as initiation of movements.   PWR! Up 10   PWR! Rock 10   PWR! Twist 10   PWR!  Step 10   Comments In sitting-used foam as target for step and started with single leg step then progressed to double leg step.  Pt needed significant verbal, visual and tactile cues with all moves.  Tends to do better with visual cues and increased time to process.             PT Education - 07/11/16 1507    Education provided Yes   Education Details bed mobility, PWR! sitting and supine   Person(s) Educated Patient;Spouse   Methods Explanation;Demonstration;Tactile cues;Verbal cues   Comprehension Verbalized understanding;Need further instruction;Verbal cues required;Tactile cues required          PT Short Term Goals - 06/30/16 1512      PT SHORT TERM GOAL #1   Title Pt will be able to perform HEP with family supervision, to address Parkinson's specific functional deficits.  TARGET 07/29/16   Time 4   Period Weeks   Status New      PT SHORT TERM GOAL #2   Title Pt will improve 5x sit<>stand to less than or equal to 18 seconds for improved transfer efficiency and safety.   Time 4   Period Weeks   Status New     PT SHORT TERM GOAL #3   Title Pt will improve TUG score to less than or equal to 23 seconds for decreased fall risk.   Time 4   Period Weeks   Status New     PT SHORT TERM GOAL #4   Title Pt will report at least 25% improvement in bed mobility/decreased caregiver assistance.   Time 4   Period Weeks     PT SHORT TERM GOAL #5   Title Pt will verbalize understanding of local Parkinson's resources.   Time 4   Period Weeks   Status New           PT Long Term Goals - 06/30/16 1527      PT LONG TERM GOAL #1   Title Pt/husband will verbalize understanding of fall prevention within the home environment.  TARGET 08/27/16   Time 8   Period Weeks   Status New     PT LONG TERM GOAL #2   Title Pt will improve 5x sit<>stand to less than or equal to 15 seconds for improved transfer efficiency and safety.   Time 8   Period Weeks   Status New     PT LONG TERM GOAL #3   Title Pt will improve TUG score to less than or equal to 18 seconds for decreased fall risk.   Time 8   Period Weeks   Status New     PT LONG TERM GOAL #4   Title Pt will improve Functional Gait Asessment to at least 15/30 for decreased fall risk.   Time 8   Period Weeks   Status New     PT LONG TERM GOAL #5   Title Pt will ambulate at least 500 ft indoor and outdoor surfaces independently, for improved safety and efficiency with gait.   Time 8   Status New     Additional Long Term Goals   Additional Long Term Goals Yes     PT LONG TERM GOAL #6   Title Pt will verbalize plans for continued community fitness upon D/C from PT.   Time 8   Period Weeks   Status New               Plan - 07/11/16 [id number]  Clinical Impression Statement Pt needing significant cues for all mobility and with decreased initiation with  mobility.  Husband appears supportive.  Pt tries to work thru fatigue during session.  Continue PT per POC.   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 moves   Consulted and Agree with Plan of Care Patient;Family member/caregiver   Family Member Consulted Husband      Patient will benefit from skilled therapeutic intervention in order to improve the following deficits and impairments:  Abnormal gait, Decreased balance, Decreased mobility, Difficulty walking, Postural dysfunction  Visit Diagnosis: Other abnormalities of gait and mobility  Other symptoms and signs involving the nervous system  Abnormal posture  Muscle weakness (generalized)     Problem List Patient Active Problem List   Diagnosis Date Noted  . Memory loss 12/13/2014  . Soft tissue mass 12/13/2014  . Recent skin changes 12/13/2014  . Hx of adenomatous colonic polyps 08/18/2014  . Fungal dermatitis 03/19/2014  . Overweight 03/19/2014  . Cataract   . Constipation, slow transit 09/03/2012  . Seasonal allergic rhinitis 04/18/2012  . General medical examination 05/18/2011  . Elevated BP 03/13/2011  . Vaginal dryness, menopausal 03/13/2011  . Vitamin D deficiency 03/13/2011  . Hypertriglyceridemia 03/13/2011  . OSTEOPENIA 05/31/2010  . Parkinsons disease ([place]) 04/07/2010  . POSTMENOPAUSAL STATUS 04/07/2010  . 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 DRY MOUTH 03/07/2010  . 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 PARESTHESIA 01/26/2009  . Hypothyroidism 10/20/2008  . TINNITUS  10/20/2008    Narda Bonds 07/11/2016, 3:12 PM  [facility] [address] [place], [place], [address] Phone: [phone]   Fax:  [phone]  Name: Melissa Douglas MRN: [id number] Date of Birth: July 15, 1941   Narda Bonds, [place] 07/11/16 3:12 PM Phone: [phone] Fax: [phone]

## 2016-07-12 ENCOUNTER — Ambulatory Visit: Payer: Medicare Other | Admitting: Physical Therapy

## 2016-07-12 DIAGNOSIS — R293 Abnormal posture: Secondary | ICD-10-CM

## 2016-07-12 DIAGNOSIS — M6281 Muscle weakness (generalized): Secondary | ICD-10-CM

## 2016-07-12 DIAGNOSIS — R29818 Other symptoms and signs involving the nervous system: Secondary | ICD-10-CM | POA: Diagnosis not present

## 2016-07-12 DIAGNOSIS — R2689 Other abnormalities of gait and mobility: Secondary | ICD-10-CM

## 2016-07-12 NOTE — Therapy (Signed)
[facility] [address] [place], [place], [address] Phone: [phone]   Fax:  [phone]  Physical Therapy Treatment  Patient Details  Name: Melissa Douglas MRN: [id number] Date of Birth: 11-29-1940 Referring Provider: Linus Mako  Encounter Date: 07/12/2016      PT End of Session - 07/12/16 1516    Visit Number 4   Number of Visits 25   Date for PT Re-Evaluation 08/28/16   Authorization Type Medicare/ BCBS secondary-GCODE every 10th visit   PT Start Time 1407   PT Stop Time 1502   PT Time Calculation (min) 55 min   Activity Tolerance Other (comment);Patient tolerated treatment well  limited by decreased processing and initiation as well as need for continuous verbal, visual and tactile cues   Behavior During Therapy [facility] for tasks assessed/performed      Past Medical History:  Diagnosis Date  . Cataract Right eye   HAD SURGERY  . Chronic joint pain   . Hx of adenomatous colonic polyps 08/18/2014  . Hypothyroidism   . Multiple nasal polyps   . Osteopenia   . Parkinson disease ([place])   . Parkinson's disease [facility])     Past Surgical History:  Procedure Laterality Date  . ABDOMINAL HYSTERECTOMY     ovaries remain  . APPENDECTOMY    . CATARACT EXTRACTION    . TONSILLECTOMY      There were no vitals filed for this visit.      Subjective Assessment - 07/12/16 1505    Subjective Pt still having upset stomach.  Reports having a hard time getting up from commode as well as freezing when walking into bathroom.   Patient is accompained by: Family member  husband-Mike   Pertinent History Parkinson's diagnosis 3-4 years ago.   Patient Stated Goals Pt's goals fro therapy is to work on balance, not feeling that I will fall.   Currently in Pain? No/denies  states nausea is 9/10 yet participated well during session today           [facility] Adult PT Treatment/Exercise - 07/12/16 0001      Transfers   Transfers  Sit to Stand;Stand to Sit   Sit to Stand 4: Min guard;5: Supervision;Without upper extremity assist;From chair/3-in-1;From toilet   Sit to Stand Details Tactile cues for initiation;Tactile cues for sequencing;Tactile cues for weight shifting;Tactile cues for placement;Verbal cues for sequencing;Verbal cues for technique   Sit to Stand Details (indicate cue type and reason) Pt has freezing when coming up to standing-will straighten out her knees but gets stuck and unable to realize she needs to tuck her bottom under.  Worked on transfers from 20" mat, 18" mat and 21" BSC.  Pt able to perform at end of session with supervision and minimal verbal cues.     Stand to Sit 5: Supervision;Without upper extremity assist;To chair/3-in-1;To toilet   Number of Reps 10 reps;Other sets (comment)  4 sets     Self-Care   Self-Care Other Self-Care Comments   Other Self-Care Comments  Discussed ways to reduce freezing, especially in pts bathroom.  Discussed using tape on tile floor and counting steps from door to commode.  Discussed using BSC to ease transfer as elevation over toilet as well as beside bed at night for ease and safety (husband sleeps downstairs).     Knee/Hip Exercises: Aerobic   Other Aerobic Scifit level 1.5 all 4 extremities x 8 minutes for flexibility and cadence working on maintaining rpm>65.  Needed moderate  verbal and sometimes tactile cues to maintain speed.           PWR [facility]) - 07/12/16 1513    PWR! exercises Hands   PWR! Up 10   PWR! Rock 10   PWR! Twist 10   PWR! Step 10   Comments PWR! Hands provided as HEP.  Husband also video taped pt performing during session for carryover.  Pt needed moderated-max cues for intensity and technique.             PT Education - 07/12/16 1514    Education provided Yes   Education Details Tips to reduce freezing, sit<>stand transfers, use of BSC, PWR! Hands, Scifit   Person(s) Educated Patient;Spouse   Methods  Explanation;Demonstration;Tactile cues;Verbal cues;Handout   Comprehension Verbalized understanding;Returned demonstration;Verbal cues required;Need further instruction;Other (comment)  husband able to provide cues to pt          PT Short Term Goals - 06/30/16 1512      PT SHORT TERM GOAL #1   Title Pt will be able to perform HEP with family supervision, to address Parkinson's specific functional deficits.  TARGET 07/29/16   Time 4   Period Weeks   Status New     PT SHORT TERM GOAL #2   Title Pt will improve 5x sit<>stand to less than or equal to 18 seconds for improved transfer efficiency and safety.   Time 4   Period Weeks   Status New     PT SHORT TERM GOAL #3   Title Pt will improve TUG score to less than or equal to 23 seconds for decreased fall risk.   Time 4   Period Weeks   Status New     PT SHORT TERM GOAL #4   Title Pt will report at least 25% improvement in bed mobility/decreased caregiver assistance.   Time 4   Period Weeks     PT SHORT TERM GOAL #5   Title Pt will verbalize understanding of local Parkinson's resources.   Time 4   Period Weeks   Status New           PT Long Term Goals - 06/30/16 1527      PT LONG TERM GOAL #1   Title Pt/husband will verbalize understanding of fall prevention within the home environment.  TARGET 08/27/16   Time 8   Period Weeks   Status New     PT LONG TERM GOAL #2   Title Pt will improve 5x sit<>stand to less than or equal to 15 seconds for improved transfer efficiency and safety.   Time 8   Period Weeks   Status New     PT LONG TERM GOAL #3   Title Pt will improve TUG score to less than or equal to 18 seconds for decreased fall risk.   Time 8   Period Weeks   Status New     PT LONG TERM GOAL #4   Title Pt will improve Functional Gait Asessment to at least 15/30 for decreased fall risk.   Time 8   Period Weeks   Status New     PT LONG TERM GOAL #5   Title Pt will ambulate at least 500 ft indoor and  outdoor surfaces independently, for improved safety and efficiency with gait.   Time 8   Status New     Additional Long Term Goals   Additional Long Term Goals Yes     PT LONG TERM GOAL #6   Title  Pt will verbalize plans for continued community fitness upon D/C from PT.   Time 8   Period Weeks   Status New               Plan - 07/12/16 1516    Clinical Impression Statement Pt responded well to instructions today and able to work through c/o nausea.  Needs significant cues.  Husband appears supportive.  Continue PT per POC.   Rehab Potential Good   PT Frequency 3x / week   PT Duration 8 weeks  plus eval   PT Treatment/Interventions ADLs/Self Care Home Management;Functional mobility training;Gait training;Therapeutic activities;Therapeutic exercise;Balance training;Neuromuscular re-education   PT Next Visit Plan Scifit for flexibility and endurance; gait with intensity and armswing, develop walking program, review PWR! hands, review sit<>stand transfer technique   Recommended Other Services Husband and pt to decide if they are interested in OT (PT recommended on eval)   Consulted and Agree with Plan of Care Patient;Family member/caregiver   Family Member Consulted Husband      Patient will benefit from skilled therapeutic intervention in order to improve the following deficits and impairments:  Abnormal gait, Decreased balance, Decreased mobility, Difficulty walking, Postural dysfunction  Visit Diagnosis: Other abnormalities of gait and mobility  Other symptoms and signs involving the nervous system  Abnormal posture  Muscle weakness (generalized)     Problem List Patient Active Problem List   Diagnosis Date Noted  . Memory loss 12/13/2014  . Soft tissue mass 12/13/2014  . Recent skin changes 12/13/2014  . Hx of adenomatous colonic polyps 08/18/2014  . Fungal dermatitis 03/19/2014  . Overweight 03/19/2014  . Cataract   . Constipation, slow transit 09/03/2012   . Seasonal allergic rhinitis 04/18/2012  . General medical examination 05/18/2011  . Elevated BP 03/13/2011  . Vaginal dryness, menopausal 03/13/2011  . Vitamin D deficiency 03/13/2011  . Hypertriglyceridemia 03/13/2011  . OSTEOPENIA 05/31/2010  . Parkinsons disease ([place]) 04/07/2010  . POSTMENOPAUSAL STATUS 04/07/2010  . 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 DRY MOUTH 03/07/2010  . 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 PARESTHESIA 01/26/2009  . Hypothyroidism 10/20/2008  . 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 [place], [place], [address] Phone: [phone]   Fax:  [phone]  Name: Melinna Boyett MRN: [id number] Date of Birth: 1941-02-03   Narda Bonds, [place] 07/12/16 3:20 PM Phone: [phone] Fax: [phone]

## 2016-07-12 NOTE — Patient Instructions (Signed)
Tips to reduce freezing episodes with standing or walking:  1. Stand tall with your feet wide, so that you can rock and weight shift through your hips. 2. Don't try to fight the freeze: if you begin taking slower, faster, smaller steps, STOP, get your posture tall, and RESET your posture and balance.  Take a deep breath before taking the BIG step to start again. 3. March in place, with high knee stepping, to get started walking again. 4. Use auditory cues:  Count out loud, think of a familiar tune or song or cadence, use pocket metronome, to use rhythm to get started walking again. 5. Use visual cues:  Use a line to step over, use laser pointer line to step over, (using BIG steps) to start walking again. 6. Use visual targets to keep your posture tall (look ahead and focus on an object or target at eye level). 7. As you approach where your destination with walking, count your steps out loud and/or focus on your target with your eyes until you are fully there. 8. Use appropriate assistive device, as advised by your physical therapist to assist with taking longer, consistent steps.  Sit to Stand Transfers:  Scoot out to the edge of the chair. Place your feet flat on the floor, shoulder width apart.  Make sure your feet are tucked just under your knees. Lean forward (nose over toes) with momentum, and stand up tall with your best posture.  If you need to use your arms, use them as a quick boost up to stand. If you are in a low or soft chair, you can lean back and then forward up to stand, in order to get more momentum. Remember to tuck your bottom underneath once your knees are straight. Once you are standing, make sure you are looking ahead and standing tall.  To sit down:  Back up until you feel the chair behind your legs. Bend at you hips, reaching  Back for you chair, if needed, then slowly squat to sit down on your chair.             Functional Quadriceps: Sit to  Stand    Sit on edge of chair, feet flat on floor. Stand upright, extending knees fully. Repeat 10 times. Do 3 sessions per day.  http://orth.exer.us/735   Copyright  VHI. All rights reserved.

## 2016-07-17 ENCOUNTER — Ambulatory Visit: Payer: Medicare Other | Attending: Neurology | Admitting: Physical Therapy

## 2016-07-17 DIAGNOSIS — R293 Abnormal posture: Secondary | ICD-10-CM | POA: Insufficient documentation

## 2016-07-17 DIAGNOSIS — R2689 Other abnormalities of gait and mobility: Secondary | ICD-10-CM | POA: Diagnosis not present

## 2016-07-17 DIAGNOSIS — M6281 Muscle weakness (generalized): Secondary | ICD-10-CM | POA: Diagnosis not present

## 2016-07-17 DIAGNOSIS — R29818 Other symptoms and signs involving the nervous system: Secondary | ICD-10-CM | POA: Insufficient documentation

## 2016-07-17 NOTE — Therapy (Signed)
[facility] [address] [place], [place], [address] Phone: [phone]   Fax:  [phone]  Physical Therapy Treatment  Patient Details  Name: Melissa Douglas MRN: [id number] Date of Birth: Aug 27, 1941 Referring Provider: Linus Mako  Encounter Date: 07/17/2016      PT End of Session - 07/17/16 1507    Visit Number 5   Number of Visits 25   Date for PT Re-Evaluation 08/28/16   Authorization Type Medicare/ BCBS secondary-GCODE every 10th visit   PT Start Time 1105   PT Stop Time 1145   PT Time Calculation (min) 40 min   Equipment Utilized During Treatment Gait belt   Activity Tolerance Other (comment);Patient tolerated treatment well  limited by decreased processing and initiation as well as need for continuous verbal, visual and tactile cues   Behavior During Therapy [facility] for tasks assessed/performed      Past Medical History:  Diagnosis Date  . Cataract Right eye   HAD SURGERY  . Chronic joint pain   . Hx of adenomatous colonic polyps 08/18/2014  . Hypothyroidism   . Multiple nasal polyps   . Osteopenia   . Parkinson disease ([place])   . Parkinson's disease [facility])     Past Surgical History:  Procedure Laterality Date  . ABDOMINAL HYSTERECTOMY     ovaries remain  . APPENDECTOMY    . CATARACT EXTRACTION    . TONSILLECTOMY      There were no vitals filed for this visit.      Subjective Assessment - 07/17/16 1113    Subjective Pt reports being nauseated and vomiting this weekend (due to meds).  Purchased BSC but feels unsteady with it.   Patient is accompained by: Family member  husband   Pertinent History Parkinson's diagnosis 3-4 years ago.   Patient Stated Goals Pt's goals fro therapy is to work on balance, not feeling that I will fall.   Currently in Pain? No/denies           [facility] Adult PT Treatment/Exercise - 07/17/16 0001      Transfers   Transfers Sit to Stand;Stand to Sit   Sit to Stand 5:  Supervision   Sit to Stand Details (indicate cue type and reason) verbal cues for technique   Stand to Sit 5: Supervision   Number of Reps 10 reps;2 sets  one set from mat then one set from 17" chair;also worked on transfers during session to various surfaces and reviewed transfer techniques.     Ambulation/Gait   Ambulation/Gait Yes   Ambulation/Gait Assistance 5: Supervision   Ambulation Distance (Feet) 800 Feet  plus;110' x 4   Assistive device None;Other (Comment)  PTA using walking poles to assist with armswing   Gait Pattern Decreased arm swing - right;Step-through pattern;Decreased arm swing - left;Decreased step length - right;Shuffle;Decreased trunk rotation;Narrow base of support;Trunk flexed   Ambulation Surface Level;Indoor   Gait Comments Pt needs constant cues for step length, cadence, armswing and heal strike.  Improved with use of walking poles with PTA holding end to assist with arm swing.       Knee/Hip Exercises: Aerobic   Other Aerobic Scifit level 1.5 all 4 extremities x 8 minutes for flexibility and cadence working on maintaining rpm>65.  At times able to achieve >80 rpm.  Needed minimal verbal to maintain speed.           PWR [facility]) - 07/17/16 1310    PWR! exercises Moves in standing;Functional moves  PWR! Up 20   PWR! Rock 20  twice   Comments In standing-used tactile and verbal cues for intensity with moves.  For rock also worked on [employer] shifting initially then added UE reach.  Stood in front of pt and had pt reach to PTA's hands.               PT Short Term Goals - 06/30/16 1512      PT SHORT TERM GOAL #1   Title Pt will be able to perform HEP with family supervision, to address Parkinson's specific functional deficits.  TARGET 07/29/16   Time 4   Period Weeks   Status New     PT SHORT TERM GOAL #2   Title Pt will improve 5x sit<>stand to less than or equal to 18 seconds for improved transfer efficiency and safety.   Time 4   Period  Weeks   Status New     PT SHORT TERM GOAL #3   Title Pt will improve TUG score to less than or equal to 23 seconds for decreased fall risk.   Time 4   Period Weeks   Status New     PT SHORT TERM GOAL #4   Title Pt will report at least 25% improvement in bed mobility/decreased caregiver assistance.   Time 4   Period Weeks     PT SHORT TERM GOAL #5   Title Pt will verbalize understanding of local Parkinson's resources.   Time 4   Period Weeks   Status New           PT Long Term Goals - 06/30/16 1527      PT LONG TERM GOAL #1   Title Pt/husband will verbalize understanding of fall prevention within the home environment.  TARGET 08/27/16   Time 8   Period Weeks   Status New     PT LONG TERM GOAL #2   Title Pt will improve 5x sit<>stand to less than or equal to 15 seconds for improved transfer efficiency and safety.   Time 8   Period Weeks   Status New     PT LONG TERM GOAL #3   Title Pt will improve TUG score to less than or equal to 18 seconds for decreased fall risk.   Time 8   Period Weeks   Status New     PT LONG TERM GOAL #4   Title Pt will improve Functional Gait Asessment to at least 15/30 for decreased fall risk.   Time 8   Period Weeks   Status New     PT LONG TERM GOAL #5   Title Pt will ambulate at least 500 ft indoor and outdoor surfaces independently, for improved safety and efficiency with gait.   Time 8   Status New     Additional Long Term Goals   Additional Long Term Goals Yes     PT LONG TERM GOAL #6   Title Pt will verbalize plans for continued community fitness upon D/C from PT.   Time 8   Period Weeks   Status New               Plan - 07/17/16 1508    Clinical Impression Statement Pts gait improved today with cues.  Also with improved speed on Scifit.  Continue PT per POC.   Rehab Potential Good   PT Frequency 3x / week   PT Duration 8 weeks  plus eval   PT Treatment/Interventions ADLs/Self Care  Home  Management;Functional mobility training;Gait training;Therapeutic activities;Therapeutic exercise;Balance training;Neuromuscular re-education   PT Next Visit Plan Scifit for flexibility and endurance; gait with intensity and armswing, develop walking program   Recommended Other Services Husband and pt to decide if they are interested in OT (PT recommeded on eval)   Consulted and Agree with Plan of Care Patient;Family member/caregiver   Family Member Consulted Husband      Patient will benefit from skilled therapeutic intervention in order to improve the following deficits and impairments:  Abnormal gait, Decreased balance, Decreased mobility, Difficulty walking, Postural dysfunction  Visit Diagnosis: Other abnormalities of gait and mobility  Other symptoms and signs involving the nervous system  Abnormal posture  Muscle weakness (generalized)     Problem List Patient Active Problem List   Diagnosis Date Noted  . Memory loss 12/13/2014  . Soft tissue mass 12/13/2014  . Recent skin changes 12/13/2014  . Hx of adenomatous colonic polyps 08/18/2014  . Fungal dermatitis 03/19/2014  . Overweight 03/19/2014  . Cataract   . Constipation, slow transit 09/03/2012  . Seasonal allergic rhinitis 04/18/2012  . General medical examination 05/18/2011  . Elevated BP 03/13/2011  . Vaginal dryness, menopausal 03/13/2011  . Vitamin D deficiency 03/13/2011  . Hypertriglyceridemia 03/13/2011  . OSTEOPENIA 05/31/2010  . Parkinsons disease ([place]) 04/07/2010  . POSTMENOPAUSAL STATUS 04/07/2010  . 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 DRY MOUTH 03/07/2010  . 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 PARESTHESIA 01/26/2009  . Hypothyroidism 10/20/2008  . 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 [place], [place], [address] Phone: [phone]   Fax:  [phone]  Name: Tyasha Busko MRN: [id number] Date of Birth: 15-Feb-1941   Narda Bonds, [place] 07/17/16 3:10 PM Phone: [phone] Fax: [phone]

## 2016-07-18 ENCOUNTER — Ambulatory Visit: Payer: Medicare Other | Admitting: Physical Therapy

## 2016-07-19 ENCOUNTER — Ambulatory Visit: Payer: Medicare Other | Admitting: Physical Therapy

## 2016-07-24 ENCOUNTER — Ambulatory Visit: Payer: Medicare Other | Admitting: Physical Therapy

## 2016-07-24 DIAGNOSIS — R2689 Other abnormalities of gait and mobility: Secondary | ICD-10-CM | POA: Diagnosis not present

## 2016-07-24 DIAGNOSIS — R293 Abnormal posture: Secondary | ICD-10-CM | POA: Diagnosis not present

## 2016-07-24 DIAGNOSIS — R29818 Other symptoms and signs involving the nervous system: Secondary | ICD-10-CM | POA: Diagnosis not present

## 2016-07-24 DIAGNOSIS — M6281 Muscle weakness (generalized): Secondary | ICD-10-CM | POA: Diagnosis not present

## 2016-07-25 ENCOUNTER — Ambulatory Visit: Payer: Medicare Other | Admitting: Physical Therapy

## 2016-07-25 NOTE — Therapy (Signed)
[facility] [address] [place], [place], [address] Phone: [phone]   Fax:  [phone]  Physical Therapy Treatment  Patient Details  Name: Melissa Douglas MRN: [id number] Date of Birth: 27-Mar-1941 Referring Provider: Linus Mako  Encounter Date: 07/24/2016      PT End of Session - 07/25/16 1807    Visit Number 6   Number of Visits 25   Date for PT Re-Evaluation 08/28/16   Authorization Type Medicare/ BCBS secondary-GCODE every 10th visit   PT Start Time 1322   PT Stop Time 1404   PT Time Calculation (min) 42 min   Equipment Utilized During Treatment Gait belt   Activity Tolerance Other (comment);Patient tolerated treatment well  limited by decreased processing and initiation as well as need for continuous verbal, visual and tactile cues   Behavior During Therapy [facility] for tasks assessed/performed      Past Medical History:  Diagnosis Date  . Cataract Right eye   HAD SURGERY  . Chronic joint pain   . Hx of adenomatous colonic polyps 08/18/2014  . Hypothyroidism   . Multiple nasal polyps   . Osteopenia   . Parkinson disease ([place])   . Parkinson's disease [facility])     Past Surgical History:  Procedure Laterality Date  . ABDOMINAL HYSTERECTOMY     ovaries remain  . APPENDECTOMY    . CATARACT EXTRACTION    . TONSILLECTOMY      There were no vitals filed for this visit.      Subjective Assessment - 07/24/16 1325    Subjective Had some issues with my teeth last week and had to cancel a few appointments for PT.  Hard to get better with PT when I don't come as often.   Patient is accompained by: Family member  husband   Pertinent History Parkinson's diagnosis 3-4 years ago.   Patient Stated Goals Pt's goals fro therapy is to work on balance, not feeling that I will fall.   Currently in Pain? No/denies                         [facility] Adult PT Treatment/Exercise - 07/25/16 0001      Bed  Mobility   Bed Mobility Left Sidelying to Sit;Rolling Right;Rolling Left;Sit to Sidelying Left   Rolling Right 4: Min assist   Rolling Left 4: Min assist   Left Sidelying to Sit 4: Min assist   Sit to Sidelying Left 4: Min assist   Sit to Sidelying Left Details (indicate cue type and reason) With all bed mobility as above, attempted movement patterns, including lower trunk rotation and reaching of RUE (with simulated bed rail), trying to incorporate increased momentum and amplitude/intensity of movement to aid in bed mobility; pt has difficulty with sequencing and with coordinating the movement patterns together with increased intensity.     Transfers   Transfers Sit to Stand;Stand to Sit   Sit to Stand 5: Supervision   Sit to Stand Details (indicate cue type and reason) verbal cues for technqiue   Five time sit to stand comments  13.76 sec   Stand to Sit 5: Supervision   Number of Reps --  Several reps throughout session; verbal cues needed     Ambulation/Gait   Ambulation/Gait Yes   Ambulation/Gait Assistance 5: Supervision   Ambulation/Gait Assistance Details cues provided for upright posture, increased arm swing and increased step length   Ambulation Distance (Feet) 230 Feet  then  100 ft   Assistive device None   Gait Pattern Decreased arm swing - right;Step-through pattern;Decreased arm swing - left;Decreased step length - right;Shuffle;Decreased trunk rotation;Narrow base of support;Trunk flexed   Ambulation Surface Level;Indoor     Standardized Balance Assessment   Standardized Balance Assessment Timed Up and Go Test     Timed Up and Go Test   TUG Normal TUG   Normal TUG (seconds) 17.83           PWR ([facility]) - 07/25/16 1805    PWR! exercises Moves in sitting   PWR! Up 10   PWR! Rock 10   PWR! Twist 10   Comments Reviewed HEP in sitting; provided cues for large amplitude, intensity of movement and connected exercises to functional mobility activities                 PT Short Term Goals - 07/24/16 1328      PT SHORT TERM GOAL #1   Title Pt will be able to perform HEP with family supervision, to address Parkinson's specific functional deficits.  TARGET 07/29/16   Time 4   Period Weeks   Status New     PT SHORT TERM GOAL #2   Title Pt will improve 5x sit<>stand to less than or equal to 18 seconds for improved transfer efficiency and safety.   Baseline 13.76 sec 07/24/16   Time 4   Period Weeks   Status Achieved     PT SHORT TERM GOAL #3   Title Pt will improve TUG score to less than or equal to 23 seconds for decreased fall risk.   Baseline 17.83 sec 07/24/16   Time 4   Period Weeks   Status Achieved     PT SHORT TERM GOAL #4   Title Pt will report at least 25% improvement in bed mobility/decreased caregiver assistance.   Time 4   Period Weeks     PT SHORT TERM GOAL #5   Title Pt will verbalize understanding of local Parkinson's resources.   Time 4   Period Weeks   Status New           PT Long Term Goals - 06/30/16 1527      PT LONG TERM GOAL #1   Title Pt/husband will verbalize understanding of fall prevention within the home environment.  TARGET 08/27/16   Time 8   Period Weeks   Status New     PT LONG TERM GOAL #2   Title Pt will improve 5x sit<>stand to less than or equal to 15 seconds for improved transfer efficiency and safety.   Time 8   Period Weeks   Status New     PT LONG TERM GOAL #3   Title Pt will improve TUG score to less than or equal to 18 seconds for decreased fall risk.   Time 8   Period Weeks   Status New     PT LONG TERM GOAL #4   Title Pt will improve Functional Gait Asessment to at least 15/30 for decreased fall risk.   Time 8   Period Weeks   Status New     PT LONG TERM GOAL #5   Title Pt will ambulate at least 500 ft indoor and outdoor surfaces independently, for improved safety and efficiency with gait.   Time 8   Status New     Additional Long Term Goals   Additional  Long Term Goals Yes     PT LONG TERM  GOAL #6   Title Pt will verbalize plans for continued community fitness upon D/C from PT.   Time 8   Period Weeks   Status New               Plan - 07/25/16 1807    Clinical Impression Statement Began assessing goals today.  Pt has met STG #2 and 3.  STG # 1 and 4 not yet met.  Pt requires frequent cues for exercises, especially for technique and for increased intensity of movement.  Husband reports improved bed mobility due to new mattress, but pt has significant difficulty sequencing bed mobility within PT session today.  Pt has had to cancel several appointments due to dental work, and pt will continue to benefit from skilled PT to address balance, posture, transfers, functional mobility and gait.   Rehab Potential Good   PT Frequency 3x / week   PT Duration 8 weeks  plus eval   PT Treatment/Interventions ADLs/Self Care Home Management;Functional mobility training;Gait training;Therapeutic activities;Therapeutic exercise;Balance training;Neuromuscular re-education   PT Next Visit Plan Scifit for flexibility and endurance; gait with intensity and armswing, develop walking program; check PWR! Moves again in sitting   Consulted and Agree with Plan of Care Patient;Family member/caregiver   Family Member Consulted Husband      Patient will benefit from skilled therapeutic intervention in order to improve the following deficits and impairments:  Abnormal gait, Decreased balance, Decreased mobility, Difficulty walking, Postural dysfunction  Visit Diagnosis: Other symptoms and signs involving the nervous system  Abnormal posture  Other abnormalities of gait and mobility     Problem List Patient Active Problem List   Diagnosis Date Noted  . Memory loss 12/13/2014  . Soft tissue mass 12/13/2014  . Recent skin changes 12/13/2014  . Hx of adenomatous colonic polyps 08/18/2014  . Fungal dermatitis 03/19/2014  . Overweight 03/19/2014  .  Cataract   . Constipation, slow transit 09/03/2012  . Seasonal allergic rhinitis 04/18/2012  . General medical examination 05/18/2011  . Elevated BP 03/13/2011  . Vaginal dryness, menopausal 03/13/2011  . Vitamin D deficiency 03/13/2011  . Hypertriglyceridemia 03/13/2011  . OSTEOPENIA 05/31/2010  . Parkinsons disease ([place]) 04/07/2010  . POSTMENOPAUSAL STATUS 04/07/2010  . 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 DRY MOUTH 03/07/2010  . 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 PARESTHESIA 01/26/2009  . Hypothyroidism 10/20/2008  . 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 07/25/2016, 6:11 PM  Frazier Butt., PT  [facility] [address] [place], [place], [address] Phone: [phone]   Fax:  [phone]  Name: Melissa Douglas MRN: [id number] Date of Birth: 01/07/1941

## 2016-07-26 ENCOUNTER — Ambulatory Visit: Payer: Medicare Other | Admitting: Physical Therapy

## 2016-07-30 ENCOUNTER — Ambulatory Visit: Payer: Medicare Other | Admitting: Physical Therapy

## 2016-08-01 ENCOUNTER — Ambulatory Visit: Payer: Medicare Other | Admitting: Physical Therapy

## 2016-08-02 ENCOUNTER — Ambulatory Visit: Payer: Medicare Other | Admitting: Physical Therapy

## 2016-08-06 DIAGNOSIS — Z5181 Encounter for therapeutic drug level monitoring: Secondary | ICD-10-CM | POA: Diagnosis not present

## 2016-08-06 DIAGNOSIS — R259 Unspecified abnormal involuntary movements: Secondary | ICD-10-CM | POA: Diagnosis not present

## 2016-08-06 DIAGNOSIS — I493 Ventricular premature depolarization: Secondary | ICD-10-CM | POA: Diagnosis not present

## 2016-08-07 ENCOUNTER — Ambulatory Visit: Payer: Medicare Other | Admitting: Physical Therapy

## 2016-08-08 ENCOUNTER — Ambulatory Visit: Payer: Medicare Other | Admitting: Physical Therapy

## 2016-08-08 DIAGNOSIS — I4581 Long QT syndrome: Secondary | ICD-10-CM | POA: Diagnosis not present

## 2016-08-08 DIAGNOSIS — Z885 Allergy status to narcotic agent status: Secondary | ICD-10-CM | POA: Diagnosis not present

## 2016-08-08 DIAGNOSIS — Z87891 Personal history of nicotine dependence: Secondary | ICD-10-CM | POA: Diagnosis not present

## 2016-08-08 DIAGNOSIS — Z79899 Other long term (current) drug therapy: Secondary | ICD-10-CM | POA: Diagnosis not present

## 2016-08-08 DIAGNOSIS — Z888 Allergy status to other drugs, medicaments and biological substances status: Secondary | ICD-10-CM | POA: Diagnosis not present

## 2016-08-08 DIAGNOSIS — G20 Parkinson's disease: Secondary | ICD-10-CM | POA: Diagnosis not present

## 2016-08-08 DIAGNOSIS — Z5181 Encounter for therapeutic drug level monitoring: Secondary | ICD-10-CM | POA: Diagnosis not present

## 2016-08-09 ENCOUNTER — Ambulatory Visit: Payer: Medicare Other | Admitting: Physical Therapy

## 2016-08-13 ENCOUNTER — Ambulatory Visit: Payer: Medicare Other | Admitting: Physical Therapy

## 2016-08-14 ENCOUNTER — Ambulatory Visit: Payer: Medicare Other | Admitting: Physical Therapy

## 2016-08-16 ENCOUNTER — Ambulatory Visit: Payer: Medicare Other | Admitting: Physical Therapy

## 2016-08-17 ENCOUNTER — Encounter: Payer: Self-pay | Admitting: Physical Therapy

## 2016-08-17 NOTE — Therapy (Signed)
[facility] [address] [place], [place], [address] Phone: [phone]   Fax:  [phone]  Patient Details  Name: Melissa Douglas MRN: [id number] Date of Birth: [birth year] Referring Provider:  No ref. provider found  Encounter Date: 2016/09/13      G-Codes - 13-Sep-2016 1323    Functional Assessment Tool Used TUG 17.83 sec; 5x sit<>stand 13.76 (Unable to make >6 appts due to not feeling well, multiple cancels)   Functional Limitation Mobility: Walking and moving around   Mobility: Walking and Moving Around Goal Status [phone]) At least 20 percent but less than 40 percent impaired, limited or restricted   Mobility: Walking and Moving Around Discharge Status [phone]) At least 40 percent but less than 60 percent impaired, limited or restricted      PHYSICAL THERAPY DISCHARGE SUMMARY  Visits from Start of Care: 6  Current functional level related to goals / functional outcomes:     PT Short Term Goals - 07/24/16 1328      PT SHORT TERM GOAL #1   Title Pt will be able to perform HEP with family supervision, to address Parkinson's specific functional deficits.  TARGET 07/29/16   Time 4   Period Weeks   Status New     PT SHORT TERM GOAL #2   Title Pt will improve 5x sit<>stand to less than or equal to 18 seconds for improved transfer efficiency and safety.   Baseline 13.76 sec 07/24/16   Time 4   Period Weeks   Status Achieved     PT SHORT TERM GOAL #3   Title Pt will improve TUG score to less than or equal to 23 seconds for decreased fall risk.   Baseline 17.83 sec 07/24/16   Time 4   Period Weeks   Status Achieved     PT SHORT TERM GOAL #4   Title Pt will report at least 25% improvement in bed mobility/decreased caregiver assistance.   Time 4   Period Weeks     PT SHORT TERM GOAL #5   Title Pt will verbalize understanding of local Parkinson's resources.   Time 4   Period Weeks   Status New    Pt has met STG  2 AND 3.  Remaining STGs and LTGs not fully assessed, as patient has cancelled multiple appointments due to not feeling well.   Remaining deficits: Bradykinesia, posture, balance, gait   Education / Equipment: Pt/husband educated in bed mobility techniques, initial HEP.  Plan: Patient agrees to discharge.  Patient goals were not met. Patient is being discharged due to the patient's request.  ?????Spoke with patient and husband via phone.  Due to patient's not feeling well due to ongoing medication adjustments, pt has had to cancel multiple appointments.  Feel at this time that discharge is appropriate to allow for optimal medication management, with possible return to OP PT in the future.     Frazier Butt 09/13/16, 1:25 PM  [facility] [address] [place], [place], [address] Phone: [phone]   Fax:  [phone]   Mady Haagensen, [place] 09-13-16 1:28 PM Phone: [phone] Fax: [phone]

## 2016-08-21 ENCOUNTER — Ambulatory Visit: Payer: Medicare Other | Admitting: Physical Therapy

## 2016-08-22 DIAGNOSIS — G20 Parkinson's disease: Secondary | ICD-10-CM | POA: Diagnosis not present

## 2016-08-22 DIAGNOSIS — G4700 Insomnia, unspecified: Secondary | ICD-10-CM | POA: Diagnosis not present

## 2016-08-22 DIAGNOSIS — E039 Hypothyroidism, unspecified: Secondary | ICD-10-CM | POA: Diagnosis not present

## 2016-08-22 DIAGNOSIS — E559 Vitamin D deficiency, unspecified: Secondary | ICD-10-CM | POA: Diagnosis not present

## 2016-08-22 DIAGNOSIS — Z9181 History of falling: Secondary | ICD-10-CM | POA: Diagnosis not present

## 2016-08-23 ENCOUNTER — Ambulatory Visit: Payer: Medicare Other | Admitting: Physical Therapy

## 2016-08-24 ENCOUNTER — Ambulatory Visit: Payer: Medicare Other | Admitting: Physical Therapy

## 2016-08-24 ENCOUNTER — Telehealth: Payer: Self-pay | Admitting: Family Medicine

## 2016-08-24 NOTE — Telephone Encounter (Signed)
Ok to add Dr Linus Mako to care team and approve all orders

## 2016-08-24 NOTE — Telephone Encounter (Signed)
Called and verbal ok given.  

## 2016-08-24 NOTE — Telephone Encounter (Signed)
Caller with Melissa Douglas is asking for a verbal order to add Dr Linus Mako to pt care orders, caller states that this referral for PT and social worker came from his office ,but due to KT being pcp she has to say this is ok.

## 2016-08-28 DIAGNOSIS — G4700 Insomnia, unspecified: Secondary | ICD-10-CM | POA: Diagnosis not present

## 2016-08-28 DIAGNOSIS — Z9181 History of falling: Secondary | ICD-10-CM | POA: Diagnosis not present

## 2016-08-28 DIAGNOSIS — E039 Hypothyroidism, unspecified: Secondary | ICD-10-CM | POA: Diagnosis not present

## 2016-08-28 DIAGNOSIS — E559 Vitamin D deficiency, unspecified: Secondary | ICD-10-CM | POA: Diagnosis not present

## 2016-08-28 DIAGNOSIS — G20 Parkinson's disease: Secondary | ICD-10-CM | POA: Diagnosis not present

## 2016-08-30 DIAGNOSIS — E559 Vitamin D deficiency, unspecified: Secondary | ICD-10-CM | POA: Diagnosis not present

## 2016-08-30 DIAGNOSIS — G4700 Insomnia, unspecified: Secondary | ICD-10-CM | POA: Diagnosis not present

## 2016-08-30 DIAGNOSIS — G20 Parkinson's disease: Secondary | ICD-10-CM | POA: Diagnosis not present

## 2016-08-30 DIAGNOSIS — E039 Hypothyroidism, unspecified: Secondary | ICD-10-CM | POA: Diagnosis not present

## 2016-08-30 DIAGNOSIS — Z9181 History of falling: Secondary | ICD-10-CM | POA: Diagnosis not present

## 2016-09-03 ENCOUNTER — Telehealth: Payer: Self-pay | Admitting: Emergency Medicine

## 2016-09-03 NOTE — Telephone Encounter (Signed)
Ok for verbal order  °

## 2016-09-03 NOTE — Telephone Encounter (Signed)
Called and gave verbal ok for home health.

## 2016-09-03 NOTE — Telephone Encounter (Signed)
Geni Bers Education officer, museum at [employer] Requesting verbal orders for visit once a week for 3 weeks for counseling and education. Please advise

## 2016-09-04 DIAGNOSIS — Z9181 History of falling: Secondary | ICD-10-CM | POA: Diagnosis not present

## 2016-09-04 DIAGNOSIS — G20 Parkinson's disease: Secondary | ICD-10-CM | POA: Diagnosis not present

## 2016-09-04 DIAGNOSIS — E559 Vitamin D deficiency, unspecified: Secondary | ICD-10-CM | POA: Diagnosis not present

## 2016-09-04 DIAGNOSIS — E039 Hypothyroidism, unspecified: Secondary | ICD-10-CM | POA: Diagnosis not present

## 2016-09-04 DIAGNOSIS — G4700 Insomnia, unspecified: Secondary | ICD-10-CM | POA: Diagnosis not present

## 2016-09-07 ENCOUNTER — Telehealth: Payer: Self-pay | Admitting: Family Medicine

## 2016-09-07 NOTE — Telephone Encounter (Signed)
FYI

## 2016-09-07 NOTE — Telephone Encounter (Signed)
Caller is with [facility], states that she is a Education officer, museum and is to see the pt 1 x a week, however pt and pt husband declined the visit for this week. Caller states that she has two more visits left with pt.

## 2016-09-11 DIAGNOSIS — Z9181 History of falling: Secondary | ICD-10-CM | POA: Diagnosis not present

## 2016-09-11 DIAGNOSIS — G20 Parkinson's disease: Secondary | ICD-10-CM | POA: Diagnosis not present

## 2016-09-11 DIAGNOSIS — E039 Hypothyroidism, unspecified: Secondary | ICD-10-CM | POA: Diagnosis not present

## 2016-09-11 DIAGNOSIS — E559 Vitamin D deficiency, unspecified: Secondary | ICD-10-CM | POA: Diagnosis not present

## 2016-09-11 DIAGNOSIS — G4700 Insomnia, unspecified: Secondary | ICD-10-CM | POA: Diagnosis not present

## 2016-09-13 ENCOUNTER — Telehealth: Payer: Self-pay | Admitting: Family Medicine

## 2016-09-13 DIAGNOSIS — E559 Vitamin D deficiency, unspecified: Secondary | ICD-10-CM | POA: Diagnosis not present

## 2016-09-13 DIAGNOSIS — G4700 Insomnia, unspecified: Secondary | ICD-10-CM | POA: Diagnosis not present

## 2016-09-13 DIAGNOSIS — G20 Parkinson's disease: Secondary | ICD-10-CM | POA: Diagnosis not present

## 2016-09-13 DIAGNOSIS — E039 Hypothyroidism, unspecified: Secondary | ICD-10-CM | POA: Diagnosis not present

## 2016-09-13 DIAGNOSIS — Z9181 History of falling: Secondary | ICD-10-CM | POA: Diagnosis not present

## 2016-09-13 NOTE — Telephone Encounter (Signed)
Noted  

## 2016-09-13 NOTE — Telephone Encounter (Signed)
Caller with [facility] home health states that she was to see pt for 3wks, pt declined visit last week and staes that pt said she is doing better and sessions are not needed at this time. Caller provided resources to husband on care givers support and told them to call if needed.

## 2016-09-14 DIAGNOSIS — Z23 Encounter for immunization: Secondary | ICD-10-CM | POA: Diagnosis not present

## 2016-09-18 DIAGNOSIS — E559 Vitamin D deficiency, unspecified: Secondary | ICD-10-CM | POA: Diagnosis not present

## 2016-09-18 DIAGNOSIS — G4700 Insomnia, unspecified: Secondary | ICD-10-CM | POA: Diagnosis not present

## 2016-09-18 DIAGNOSIS — G20 Parkinson's disease: Secondary | ICD-10-CM | POA: Diagnosis not present

## 2016-09-18 DIAGNOSIS — E039 Hypothyroidism, unspecified: Secondary | ICD-10-CM | POA: Diagnosis not present

## 2016-09-18 DIAGNOSIS — Z9181 History of falling: Secondary | ICD-10-CM | POA: Diagnosis not present

## 2016-09-20 DIAGNOSIS — G4700 Insomnia, unspecified: Secondary | ICD-10-CM | POA: Diagnosis not present

## 2016-09-20 DIAGNOSIS — E039 Hypothyroidism, unspecified: Secondary | ICD-10-CM | POA: Diagnosis not present

## 2016-09-20 DIAGNOSIS — E559 Vitamin D deficiency, unspecified: Secondary | ICD-10-CM | POA: Diagnosis not present

## 2016-09-20 DIAGNOSIS — G20 Parkinson's disease: Secondary | ICD-10-CM | POA: Diagnosis not present

## 2016-09-20 DIAGNOSIS — Z9181 History of falling: Secondary | ICD-10-CM | POA: Diagnosis not present

## 2016-09-25 DIAGNOSIS — Z79899 Other long term (current) drug therapy: Secondary | ICD-10-CM | POA: Diagnosis not present

## 2016-09-25 DIAGNOSIS — R4189 Other symptoms and signs involving cognitive functions and awareness: Secondary | ICD-10-CM | POA: Diagnosis not present

## 2016-09-25 DIAGNOSIS — I951 Orthostatic hypotension: Secondary | ICD-10-CM | POA: Diagnosis not present

## 2016-09-25 DIAGNOSIS — G252 Other specified forms of tremor: Secondary | ICD-10-CM | POA: Diagnosis not present

## 2016-09-25 DIAGNOSIS — R259 Unspecified abnormal involuntary movements: Secondary | ICD-10-CM | POA: Diagnosis not present

## 2016-09-27 DIAGNOSIS — G20 Parkinson's disease: Secondary | ICD-10-CM | POA: Diagnosis not present

## 2016-09-27 DIAGNOSIS — G4700 Insomnia, unspecified: Secondary | ICD-10-CM | POA: Diagnosis not present

## 2016-09-27 DIAGNOSIS — E039 Hypothyroidism, unspecified: Secondary | ICD-10-CM | POA: Diagnosis not present

## 2016-09-27 DIAGNOSIS — Z9181 History of falling: Secondary | ICD-10-CM | POA: Diagnosis not present

## 2016-09-27 DIAGNOSIS — E559 Vitamin D deficiency, unspecified: Secondary | ICD-10-CM | POA: Diagnosis not present

## 2016-10-10 DIAGNOSIS — H04123 Dry eye syndrome of bilateral lacrimal glands: Secondary | ICD-10-CM | POA: Diagnosis not present

## 2016-10-10 DIAGNOSIS — Z961 Presence of intraocular lens: Secondary | ICD-10-CM | POA: Diagnosis not present

## 2016-12-04 DIAGNOSIS — R066 Hiccough: Secondary | ICD-10-CM | POA: Diagnosis not present

## 2016-12-04 DIAGNOSIS — R259 Unspecified abnormal involuntary movements: Secondary | ICD-10-CM | POA: Diagnosis not present

## 2016-12-04 DIAGNOSIS — R112 Nausea with vomiting, unspecified: Secondary | ICD-10-CM | POA: Diagnosis not present

## 2016-12-04 DIAGNOSIS — R110 Nausea: Secondary | ICD-10-CM | POA: Diagnosis not present

## 2016-12-11 DIAGNOSIS — R4189 Other symptoms and signs involving cognitive functions and awareness: Secondary | ICD-10-CM | POA: Diagnosis not present

## 2016-12-11 DIAGNOSIS — R413 Other amnesia: Secondary | ICD-10-CM | POA: Diagnosis not present

## 2016-12-11 DIAGNOSIS — G20 Parkinson's disease: Secondary | ICD-10-CM | POA: Diagnosis not present

## 2017-01-18 ENCOUNTER — Other Ambulatory Visit: Payer: Self-pay | Admitting: Family Medicine

## 2017-01-18 ENCOUNTER — Telehealth: Payer: Self-pay | Admitting: Family Medicine

## 2017-01-18 MED ORDER — LEVOTHYROXINE SODIUM 100 MCG PO TABS: 100.0000 ug | ORAL_TABLET | Freq: Every day | ORAL | 0 refills | Status: DC

## 2017-01-18 NOTE — Telephone Encounter (Signed)
Pt asking that a 30 supply of levothyroxine be called into [facility] on [address]. Pt did schedule a f/u appt for 3/26 but will run out by then.

## 2017-01-18 NOTE — Telephone Encounter (Signed)
Medication filled to pharmacy as requested.   

## 2017-01-30 ENCOUNTER — Other Ambulatory Visit: Payer: Self-pay | Admitting: Family Medicine

## 2017-01-30 DIAGNOSIS — Z1231 Encounter for screening mammogram for malignant neoplasm of breast: Secondary | ICD-10-CM

## 2017-02-04 ENCOUNTER — Ambulatory Visit: Payer: Medicare Other | Admitting: Family Medicine

## 2017-02-04 ENCOUNTER — Other Ambulatory Visit: Payer: Self-pay | Admitting: Neurology

## 2017-02-06 ENCOUNTER — Ambulatory Visit: Payer: Medicare Other | Admitting: Family Medicine

## 2017-02-18 ENCOUNTER — Other Ambulatory Visit: Payer: Self-pay | Admitting: Neurology

## 2017-02-19 ENCOUNTER — Other Ambulatory Visit: Payer: Self-pay | Admitting: Family Medicine

## 2017-02-20 ENCOUNTER — Ambulatory Visit (INDEPENDENT_AMBULATORY_CARE_PROVIDER_SITE_OTHER): Payer: Medicare Other | Admitting: Family Medicine

## 2017-02-20 ENCOUNTER — Ambulatory Visit: Payer: Medicare Other | Admitting: Family Medicine

## 2017-02-20 ENCOUNTER — Encounter: Payer: Self-pay | Admitting: Family Medicine

## 2017-02-20 VITALS — BP 130/80 | HR 96 | Temp 98.1°F | Resp 17 | Ht 62.0 in | Wt 132.1 lb

## 2017-02-20 DIAGNOSIS — E038 Other specified hypothyroidism: Secondary | ICD-10-CM

## 2017-02-20 DIAGNOSIS — E559 Vitamin D deficiency, unspecified: Secondary | ICD-10-CM

## 2017-02-20 DIAGNOSIS — J339 Nasal polyp, unspecified: Secondary | ICD-10-CM

## 2017-02-20 DIAGNOSIS — G20 Parkinson's disease: Secondary | ICD-10-CM

## 2017-02-20 LAB — VITAMIN D 25 HYDROXY (VIT D DEFICIENCY, FRACTURES): VITD: 36.53 ng/mL (ref 30.00–100.00)

## 2017-02-20 LAB — TSH: TSH: 0.19 u[IU]/mL — ABNORMAL LOW (ref 0.35–4.50)

## 2017-02-20 NOTE — Progress Notes (Signed)
Pre visit review using our clinic review tool, if applicable. No additional management support is needed unless otherwise documented below in the visit note. 

## 2017-02-20 NOTE — Assessment & Plan Note (Signed)
Chronic problem.  Pt has recently transferred from Dr Krista Blue to Dr Brantley Stage at [facility].  She recently had neuropsych testing done that showed 'mild cognitive impairment'.  Will continue to follow along and assist as able.

## 2017-02-20 NOTE — Assessment & Plan Note (Signed)
Chronic problem.  Check labs and replete prn.  Pt expressed understanding and is in agreement w/ plan.

## 2017-02-20 NOTE — Progress Notes (Signed)
   Subjective:    Patient ID: Melissa Douglas, female    DOB: [birth year], 76 y.o.   MRN: [id number]  HPI Hypothyroid- chronic problem.  On Levothyroxine 167mcg daily.  Has lost 23 lbs.  Pt reports ongoing dry skin, hair, nails.  + fatigue- but pt contributes this to Parkinson's.    Nasal polyps- pt w/ hx of polyps requiring removal by ENT.  Pt continues to have intermittent bleeding from L nostril.  Pt has previously seen Dr Janace Hoard but is not interested in returning.  Vit D deficiency- due for repeat labs today.  Currently on 2,000 units daily   Review of Systems For ROS see HPI     Objective:   Physical Exam  Constitutional: She is oriented to person, place, and time. She appears well-developed and well-nourished. No distress.  HENT:  Head: Normocephalic and atraumatic.  Eyes: Conjunctivae and EOM are normal. Pupils are equal, round, and reactive to light.  Neck: Normal range of motion. Neck supple. No thyromegaly present.  Cardiovascular: Normal rate, regular rhythm, normal heart sounds and intact distal pulses.   No murmur heard. Pulmonary/Chest: Effort normal and breath sounds normal. No respiratory distress.  Abdominal: Soft. She exhibits no distension. There is no tenderness.  Musculoskeletal: She exhibits no edema.  Lymphadenopathy:    She has no cervical adenopathy.  Neurological: She is alert and oriented to person, place, and time.  Bilateral parkinson tremors  Skin: Skin is warm and dry.  Psychiatric: She has a normal mood and affect. Her behavior is normal.  Vitals reviewed.         Assessment & Plan:

## 2017-02-20 NOTE — Assessment & Plan Note (Signed)
Chronic problem.  Pt is symptomatic but she is not sure what is due to her Parkinson's disease, her medication, or her thyroid.  Check labs.  Adjust meds prn

## 2017-02-20 NOTE — Patient Instructions (Signed)
Schedule your Annual Wellness Visit with our Health Coach, Maudie Mercury in 3-4 months We'll notify you of your lab results and make any changes if needed Continue to work on healthy diet and exercise as you are able- you look great!!! We'll call you with your ENT appt Call with any questions or concerns Happy Spring!!!

## 2017-02-21 ENCOUNTER — Encounter: Payer: Self-pay | Admitting: Family Medicine

## 2017-02-21 ENCOUNTER — Other Ambulatory Visit: Payer: Self-pay | Admitting: Family Medicine

## 2017-02-21 ENCOUNTER — Ambulatory Visit (HOSPITAL_BASED_OUTPATIENT_CLINIC_OR_DEPARTMENT_OTHER): Payer: Medicare Other

## 2017-02-21 ENCOUNTER — Other Ambulatory Visit: Payer: Self-pay | Admitting: General Practice

## 2017-02-21 DIAGNOSIS — E038 Other specified hypothyroidism: Secondary | ICD-10-CM

## 2017-02-21 MED ORDER — LEVOTHYROXINE SODIUM 88 MCG PO TABS: 88.0000 ug | ORAL_TABLET | Freq: Every day | ORAL | 1 refills | Status: DC

## 2017-02-28 ENCOUNTER — Encounter (HOSPITAL_BASED_OUTPATIENT_CLINIC_OR_DEPARTMENT_OTHER): Payer: Self-pay

## 2017-02-28 ENCOUNTER — Ambulatory Visit (HOSPITAL_BASED_OUTPATIENT_CLINIC_OR_DEPARTMENT_OTHER)
Admission: RE | Admit: 2017-02-28 | Discharge: 2017-02-28 | Disposition: A | Discharge status: Home / Self Care | Payer: Medicare Other | Source: Ambulatory Visit | Attending: Family Medicine | Admitting: Family Medicine

## 2017-02-28 DIAGNOSIS — Z1231 Encounter for screening mammogram for malignant neoplasm of breast: Secondary | ICD-10-CM | POA: Diagnosis present

## 2017-02-28 DIAGNOSIS — R928 Other abnormal and inconclusive findings on diagnostic imaging of breast: Secondary | ICD-10-CM | POA: Insufficient documentation

## 2017-03-01 ENCOUNTER — Encounter: Payer: Self-pay | Admitting: Family Medicine

## 2017-03-05 ENCOUNTER — Other Ambulatory Visit: Payer: Self-pay | Admitting: Family Medicine

## 2017-03-05 DIAGNOSIS — R928 Other abnormal and inconclusive findings on diagnostic imaging of breast: Secondary | ICD-10-CM

## 2017-03-05 DIAGNOSIS — J302 Other seasonal allergic rhinitis: Secondary | ICD-10-CM | POA: Diagnosis not present

## 2017-03-05 DIAGNOSIS — R040 Epistaxis: Secondary | ICD-10-CM | POA: Diagnosis not present

## 2017-03-05 NOTE — Progress Notes (Signed)
Called pt and lmovm to return call.

## 2017-03-07 ENCOUNTER — Ambulatory Visit (INDEPENDENT_AMBULATORY_CARE_PROVIDER_SITE_OTHER): Payer: Medicare Other

## 2017-03-07 VITALS — BP 138/62 | HR 73 | Ht 62.0 in | Wt 131.8 lb

## 2017-03-07 DIAGNOSIS — Z0000 Encounter for general adult medical examination without abnormal findings: Secondary | ICD-10-CM

## 2017-03-07 NOTE — Patient Instructions (Addendum)
Bring a copy of your advance directives to your next office visit.  Continue doing brain stimulating activities (puzzles, reading, adult coloring books, staying active) to keep memory sharp.   Continue to eat heart healthy diet (full of fruits, vegetables, whole grains, lean protein, water--limit salt, fat, and sugar intake) and increase physical activity as tolerated.    Fall Prevention in the Home Falls can cause injuries. They can happen to people of all ages. There are many things you can do to make your home safe and to help prevent falls. What can I do on the outside of my home?  Regularly fix the edges of walkways and driveways and fix any cracks.  Remove anything that might make you trip as you walk through a door, such as a raised step or threshold.  Trim any bushes or trees on the path to your home.  Use bright outdoor lighting.  Clear any walking paths of anything that might make someone trip, such as rocks or tools.  Regularly check to see if handrails are loose or broken. Make sure that both sides of any steps have handrails.  Any raised decks and porches should have guardrails on the edges.  Have any leaves, snow, or ice cleared regularly.  Use sand or salt on walking paths during winter.  Clean up any spills in your garage right away. This includes oil or grease spills. What can I do in the bathroom?  Use night lights.  Install grab bars by the toilet and in the tub and shower. Do not use towel bars as grab bars.  Use non-skid mats or decals in the tub or shower.  If you need to sit down in the shower, use a plastic, non-slip stool.  Keep the floor dry. Clean up any water that spills on the floor as soon as it happens.  Remove soap buildup in the tub or shower regularly.  Attach bath mats securely with double-sided non-slip rug tape.  Do not have throw rugs and other things on the floor that can make you trip. What can I do in the bedroom?  Use night  lights.  Make sure that you have a light by your bed that is easy to reach.  Do not use any sheets or blankets that are too big for your bed. They should not hang down onto the floor.  Have a firm chair that has side arms. You can use this for support while you get dressed.  Do not have throw rugs and other things on the floor that can make you trip. What can I do in the kitchen?  Clean up any spills right away.  Avoid walking on wet floors.  Keep items that you use a lot in easy-to-reach places.  If you need to reach something above you, use a strong step stool that has a grab bar.  Keep electrical cords out of the way.  Do not use floor polish or wax that makes floors slippery. If you must use wax, use non-skid floor wax.  Do not have throw rugs and other things on the floor that can make you trip. What can I do with my stairs?  Do not leave any items on the stairs.  Make sure that there are handrails on both sides of the stairs and use them. Fix handrails that are broken or loose. Make sure that handrails are as long as the stairways.  Check any carpeting to make sure that it is firmly attached to the  stairs. Fix any carpet that is loose or worn.  Avoid having throw rugs at the top or bottom of the stairs. If you do have throw rugs, attach them to the floor with carpet tape.  Make sure that you have a light switch at the top of the stairs and the bottom of the stairs. If you do not have them, ask someone to add them for you. What else can I do to help prevent falls?  Wear shoes that:  Do not have high heels.  Have rubber bottoms.  Are comfortable and fit you well.  Are closed at the toe. Do not wear sandals.  If you use a stepladder:  Make sure that it is fully opened. Do not climb a closed stepladder.  Make sure that both sides of the stepladder are locked into place.  Ask someone to hold it for you, if possible.  Clearly mark and make sure that you can  see:  Any grab bars or handrails.  First and last steps.  Where the edge of each step is.  Use tools that help you move around (mobility aids) if they are needed. These include:  Canes.  Walkers.  Scooters.  Crutches.  Turn on the lights when you go into a dark area. Replace any light bulbs as soon as they burn out.  Set up your furniture so you have a clear path. Avoid moving your furniture around.  If any of your floors are uneven, fix them.  If there are any pets around you, be aware of where they are.  Review your medicines with your doctor. Some medicines can make you feel dizzy. This can increase your chance of falling. Ask your doctor what other things that you can do to help prevent falls. This information is not intended to replace advice given to you by your health care provider. Make sure you discuss any questions you have with your health care provider. Document Released: 08/25/2009 Document Revised: 04/05/2016 Document Reviewed: 12/03/2014 Elsevier Interactive Patient Education  2017 [place] Maintenance, Female Adopting a healthy lifestyle and getting preventive care can go a long way to promote health and wellness. Talk with your health care provider about what schedule of regular examinations is right for you. This is a good chance for you to check in with your provider about disease prevention and staying healthy. In between checkups, there are plenty of things you can do on your own. Experts have done a lot of research about which lifestyle changes and preventive measures are most likely to keep you healthy. Ask your health care provider for more information. Weight and diet Eat a healthy diet  Be sure to include plenty of vegetables, fruits, low-fat dairy products, and lean protein.  Do not eat a lot of foods high in solid fats, added sugars, or salt.  Get regular exercise. This is one of the most important things you can do for your  health.  Most adults should exercise for at least 150 minutes each week. The exercise should increase your heart rate and make you sweat (moderate-intensity exercise).  Most adults should also do strengthening exercises at least twice a week. This is in addition to the moderate-intensity exercise. Maintain a healthy weight  Body mass index (BMI) is a measurement that can be used to identify possible weight problems. It estimates body fat based on height and weight. Your health care provider can help determine your BMI and help you achieve or maintain a healthy  weight.  For females 57 years of age and older:  A BMI below 18.5 is considered underweight.  A BMI of 18.5 to 24.9 is normal.  A BMI of 25 to 29.9 is considered overweight.  A BMI of 30 and above is considered obese. Watch levels of cholesterol and blood lipids  You should start having your blood tested for lipids and cholesterol at 77 years of age, then have this test every 5 years.  You may need to have your cholesterol levels checked more often if:  Your lipid or cholesterol levels are high.  You are older than 77 years of age.  You are at high risk for heart disease. Cancer screening Lung Cancer  Lung cancer screening is recommended for adults 54-53 years old who are at high risk for lung cancer because of a history of smoking.  A yearly low-dose CT scan of the lungs is recommended for people who:  Currently smoke.  Have quit within the past 15 years.  Have at least a 30-pack-year history of smoking. A pack year is smoking an average of one pack of cigarettes a day for 1 year.  Yearly screening should continue until it has been 15 years since you quit.  Yearly screening should stop if you develop a health problem that would prevent you from having lung cancer treatment. Breast Cancer  Practice breast self-awareness. This means understanding how your breasts normally appear and feel.  It also means doing  regular breast self-exams. Let your health care provider know about any changes, no matter how small.  If you are in your 20s or 30s, you should have a clinical breast exam (CBE) by a health care provider every 1-3 years as part of a regular health exam.  If you are 77 or older, have a CBE every year. Also consider having a breast X-ray (mammogram) every year.  If you have a family history of breast cancer, talk to your health care provider about genetic screening.  If you are at high risk for breast cancer, talk to your health care provider about having an MRI and a mammogram every year.  Breast cancer gene (BRCA) assessment is recommended for women who have family members with BRCA-related cancers. BRCA-related cancers include:  Breast.  Ovarian.  Tubal.  Peritoneal cancers.  Results of the assessment will determine the need for genetic counseling and BRCA1 and BRCA2 testing. Cervical Cancer  Your health care provider may recommend that you be screened regularly for cancer of the pelvic organs (ovaries, uterus, and vagina). This screening involves a pelvic examination, including checking for microscopic changes to the surface of your cervix (Pap test). You may be encouraged to have this screening done every 3 years, beginning at age 83.  For women ages 66-65, health care providers may recommend pelvic exams and Pap testing every 3 years, or they may recommend the Pap and pelvic exam, combined with testing for human papilloma virus (HPV), every 5 years. Some types of HPV increase your risk of cervical cancer. Testing for HPV may also be done on women of any age with unclear Pap test results.  Other health care providers may not recommend any screening for nonpregnant women who are considered low risk for pelvic cancer and who do not have symptoms. Ask your health care provider if a screening pelvic exam is right for you.  If you have had past treatment for cervical cancer or a condition  that could lead to cancer, you need Pap tests  and screening for cancer for at least 20 years after your treatment. If Pap tests have been discontinued, your risk factors (such as having a new sexual partner) need to be reassessed to determine if screening should resume. Some women have medical problems that increase the chance of getting cervical cancer. In these cases, your health care provider may recommend more frequent screening and Pap tests. Colorectal Cancer  This type of cancer can be detected and often prevented.  Routine colorectal cancer screening usually begins at 77 years of age and continues through 77 years of age.  Your health care provider may recommend screening at an earlier age if you have risk factors for colon cancer.  Your health care provider may also recommend using home test kits to check for hidden blood in the stool.  A small camera at the end of a tube can be used to examine your colon directly (sigmoidoscopy or colonoscopy). This is done to check for the earliest forms of colorectal cancer.  Routine screening usually begins at age 28.  Direct examination of the colon should be repeated every 5-10 years through 77 years of age. However, you may need to be screened more often if early forms of precancerous polyps or small growths are found. Skin Cancer  Check your skin from head to toe regularly.  Tell your health care provider about any new moles or changes in moles, especially if there is a change in a mole's shape or color.  Also tell your health care provider if you have a mole that is larger than the size of a pencil eraser.  Always use sunscreen. Apply sunscreen liberally and repeatedly throughout the day.  Protect yourself by wearing long sleeves, pants, a wide-brimmed hat, and sunglasses whenever you are outside. Heart disease, diabetes, and high blood pressure  High blood pressure causes heart disease and increases the risk of stroke. High blood  pressure is more likely to develop in:  People who have blood pressure in the high end of the normal range (130-139/85-89 mm Hg).  People who are overweight or obese.  People who are African American.  If you are 53-6 years of age, have your blood pressure checked every 3-5 years. If you are 108 years of age or older, have your blood pressure checked every year. You should have your blood pressure measured twice-once when you are at a hospital or clinic, and once when you are not at a hospital or clinic. Record the average of the two measurements. To check your blood pressure when you are not at a hospital or clinic, you can use:  An automated blood pressure machine at a pharmacy.  A home blood pressure monitor.  If you are between 80 years and 7 years old, ask your health care provider if you should take aspirin to prevent strokes.  Have regular diabetes screenings. This involves taking a blood sample to check your fasting blood sugar level.  If you are at a normal weight and have a low risk for diabetes, have this test once every three years after 77 years of age.  If you are overweight and have a high risk for diabetes, consider being tested at a younger age or more often. Preventing infection Hepatitis B  If you have a higher risk for hepatitis B, you should be screened for this virus. You are considered at high risk for hepatitis B if:  You were born in a country where hepatitis B is common. Ask your health care  provider which countries are considered high risk.  Your parents were born in a high-risk country, and you have not been immunized against hepatitis B (hepatitis B vaccine).  You have HIV or AIDS.  You use needles to inject street drugs.  You live with someone who has hepatitis B.  You have had sex with someone who has hepatitis B.  You get hemodialysis treatment.  You take certain medicines for conditions, including cancer, organ transplantation, and autoimmune  conditions. Hepatitis C  Blood testing is recommended for:  Everyone born from [birth year] through 1966.  Anyone with known risk factors for hepatitis C. Sexually transmitted infections (STIs)  You should be screened for sexually transmitted infections (STIs) including gonorrhea and chlamydia if:  You are sexually active and are younger than 77 years of age.  You are older than 77 years of age and your health care provider tells you that you are at risk for this type of infection.  Your sexual activity has changed since you were last screened and you are at an increased risk for chlamydia or gonorrhea. Ask your health care provider if you are at risk.  If you do not have HIV, but are at risk, it may be recommended that you take a prescription medicine daily to prevent HIV infection. This is called pre-exposure prophylaxis (PrEP). You are considered at risk if:  You are sexually active and do not regularly use condoms or know the HIV status of your partner(s).  You take drugs by injection.  You are sexually active with a partner who has HIV. Talk with your health care provider about whether you are at high risk of being infected with HIV. If you choose to begin PrEP, you should first be tested for HIV. You should then be tested every 3 months for as long as you are taking PrEP. Pregnancy  If you are premenopausal and you may become pregnant, ask your health care provider about preconception counseling.  If you may become pregnant, take 400 to 800 micrograms (mcg) of folic acid every day.  If you want to prevent pregnancy, talk to your health care provider about birth control (contraception). Osteoporosis and menopause  Osteoporosis is a disease in which the bones lose minerals and strength with aging. This can result in serious bone fractures. Your risk for osteoporosis can be identified using a bone density scan.  If you are 82 years of age or older, or if you are at risk for  osteoporosis and fractures, ask your health care provider if you should be screened.  Ask your health care provider whether you should take a calcium or vitamin D supplement to lower your risk for osteoporosis.  Menopause may have certain physical symptoms and risks.  Hormone replacement therapy may reduce some of these symptoms and risks. Talk to your health care provider about whether hormone replacement therapy is right for you. Follow these instructions at home:  Schedule regular health, dental, and eye exams.  Stay current with your immunizations.  Do not use any tobacco products including cigarettes, chewing tobacco, or electronic cigarettes.  If you are pregnant, do not drink alcohol.  If you are breastfeeding, limit how much and how often you drink alcohol.  Limit alcohol intake to no more than 1 drink per day for nonpregnant women. One drink equals 12 ounces of beer, 5 ounces of wine, or 1 ounces of hard liquor.  Do not use street drugs.  Do not share needles.  Ask your health care  provider for help if you need support or information about quitting drugs.  Tell your health care provider if you often feel depressed.  Tell your health care provider if you have ever been abused or do not feel safe at home. This information is not intended to replace advice given to you by your health care provider. Make sure you discuss any questions you have with your health care provider. Document Released: 05/14/2011 Document Revised: 04/05/2016 Document Reviewed: 08/02/2015 Elsevier Interactive Patient Education  2017 [employer].

## 2017-03-07 NOTE — Progress Notes (Addendum)
Subjective:   Melissa Douglas is a 76 y.o. female who presents for Medicare Annual (Subsequent) preventive examination. Patient accompanied by husband, [name].  Review of Systems:  No ROS.  Medicare Wellness Visit. Cardiac Risk Factors include: advanced age (>34men, >19 women)   Sleep patterns: sleeps 6 hours total, up and down throughout the night. Up to void x 5.  Home Safety/Smoke Alarms:  Smoke detectors and security in place.  Living environment; residence and Firearm Safety: Lives with husband in 2 story home, feels safe in home.  Seat Belt Safety/Bike Helmet: Wears seat belt.   Counseling:   Eye Exam-Last exam 01/2017, [facility]-Last exam 11/2016, every 6 months by Dr. Sherryle Lis ([place]).   Female:   Pap-N/A      Mammo-02/28/2017, (L) negative. (R) f/u ultrasound, scheduled 03/08/2017      Dexa scan-Last > 5 years. Declines further testing.         CCS-Colonoscopy 08/12/2014, polyps. Recall 3 years. Will schedule this fall.        Objective:     Vitals: BP 138/62 (BP Location: Right Arm, Patient Position: Sitting, Cuff Size: Normal)   Pulse 73   Ht 5\' 2"  (1.575 m)   Wt 131 lb 12.8 oz (59.8 kg)   SpO2 95%   BMI 24.11 kg/m   Body mass index is 24.11 kg/m.   Tobacco History  Smoking Status  . Former Smoker  . Types: Cigarettes  . Quit date: 01/01/2001  Smokeless Tobacco  . Never Used     Counseling given: Not Answered   Past Medical History:  Diagnosis Date  . Cataract Right eye   HAD SURGERY  . Chronic joint pain   . Hx of adenomatous colonic polyps 08/18/2014  . Hypothyroidism   . Multiple nasal polyps   . Osteopenia   . Parkinson disease ([place])   . Parkinson's disease [facility])    Past Surgical History:  Procedure Laterality Date  . ABDOMINAL HYSTERECTOMY     ovaries remain  . APPENDECTOMY    . CATARACT EXTRACTION    . TONSILLECTOMY     History reviewed. No pertinent family history. History  Sexual Activity  . Sexual activity: Not on  file    Outpatient Encounter Prescriptions as of 03/07/2017  Medication Sig  . acetaminophen (TYLENOL) 500 MG tablet Take 500-1,000 mg by mouth every 6 (six) hours as needed for pain.   . Carbidopa-Levodopa ER (SINEMET CR) 25-100 MG tablet controlled release Take 0.5 tablets by mouth 4 (four) times daily.  . cholecalciferol (VITAMIN D) 1000 units tablet Take 2,000 Units by mouth daily.  . DiphenhydrAMINE HCl, Sleep, (SOMINEX PO) Take 1 tablet by mouth at bedtime as needed (sleep).   . Donepezil HCl (ARICEPT PO) Take by mouth.  . Doxylamine Succinate, Sleep, (UNISOM PO) Take by mouth.  Marland Kitchen ipratropium (ATROVENT) 0.06 % nasal spray Place into the nose.  . levothyroxine (SYNTHROID, LEVOTHROID) 88 MCG tablet Take 1 tablet (88 mcg total) by mouth daily.  . QUEtiapine (SEROQUEL) 25 MG tablet Take 25 mg by mouth at bedtime.  . rasagiline (AZILECT) 1 MG TABS tablet Take 1 tablet (1 mg total) by mouth daily. Please call [phone] to schedule an appt for continued refills.  . [DISCONTINUED] QUEtiapine (SEROQUEL) 50 MG tablet Take 50 mg by mouth at bedtime.   No facility-administered encounter medications on file as of 03/07/2017.     Activities of Daily Living In your present state of health, do you have any difficulty performing  the following activities: 03/07/2017 02/20/2017  Hearing? N N  Vision? N N  Difficulty concentrating or making decisions? N N  Walking or climbing stairs? N N  Dressing or bathing? Y N  Doing errands, shopping? Y N  Preparing Food and eating ? N -  Using the Toilet? N -  In the past six months, have you accidently leaked urine? N -  Do you have problems with loss of bowel control? N -  Managing your Medications? Y -  Managing your Finances? Y -  Housekeeping or managing your Housekeeping? Y -  Some recent data might be hidden   Patient has some difficulty with ADL's d/t parkinson's. Husband assist when needed.  Patient Care Team: Midge Minium, MD as PCP -  General Eldridge Abrahams, MD as Referring Physician (Neurology) Pura Spice, NP as Nurse Practitioner (Nurse Practitioner) Wilford Corner. Toya Smothers., MD (Family Medicine)    Assessment:    Physical assessment deferred to PCP.  Exercise Activities and Dietary recommendations Current Exercise Habits: Home exercise routine (PT exercises, ), Type of exercise: strength training/weights, Exercise limited by: neurologic condition(s);Other - see comments (Parkinsons)   Diet (meal preparation, eat out, water intake, caffeinated beverages, dairy products, fruits and vegetables):  Drinks water  Breakfast: yogurt, muffin, eggs, pancakes Lunch: leftovers Dinner: meat and vegetables      Encouraged to continue heart healthy diet and be as active as possible.   Goals      Patient Stated   . patient (pt-stated)          Be more active with book clubs and activity.       Fall Risk Fall Risk  03/07/2017 02/20/2017 02/09/2016 03/19/2014  Falls in the past year? No No No No   Depression Screen PHQ 2/9 Scores 03/07/2017 02/20/2017 02/09/2016 03/19/2014  PHQ - 2 Score 0 0 1 0  PHQ- 9 Score - 0 - -     Cognitive Function MMSE - Mini Mental State Exam 01/03/2016 01/03/2015  Orientation to time 5 5  Orientation to Place 4 4  Registration 3 3  Attention/ Calculation 3 5  Recall 3 3  Language- name 2 objects 2 2  Language- repeat 1 1  Language- follow 3 step command 3 3  Language- read & follow direction 1 1  Write a sentence 1 1  Copy design 1 1  Total score 27 29   Patient's husband reports recent cognitive testing that resulted in "some impairment". Will bring copy at next visit.       Immunization History  Administered Date(s) Administered  . Influenza, High Dose Seasonal PF 09/14/2016  . Influenza,inj,Quad PF,36+ Mos 12/12/2015  . Influenza-Unspecified 10/12/2013, 08/29/2014  . Pneumococcal Conjugate-13 02/09/2016  . Pneumococcal Polysaccharide-23 02/08/2013   Screening  Tests Health Maintenance  Topic Date Due  . DEXA SCAN  08/12/2017 (Originally 08/08/2005)  . TETANUS/TDAP  08/12/2017 (Originally 08/09/1959)  . INFLUENZA VACCINE  06/12/2017  . COLONOSCOPY  08/12/2017  . PNA vac Low Risk Adult  Completed      Plan:      Bring a copy of your advance directives to your next office visit.  Continue doing brain stimulating activities (puzzles, reading, adult coloring books, staying active) to keep memory sharp.   Continue to eat heart healthy diet (full of fruits, vegetables, whole grains, lean protein, water--limit salt, fat, and sugar intake) and increase physical activity as tolerated.  During the course of the visit the patient was educated and counseled  about the following appropriate screening and preventive services:   Vaccines to include Pneumoccal, Influenza, Hepatitis B, Td, Zostavax, HCV  Cardiovascular Disease  Colorectal cancer screening  Bone density screening  Diabetes screening  Glaucoma screening  Mammography/PAP  Nutrition counseling   Patient Instructions (the written plan) was given to the patient.   Gerilyn Nestle, RN  03/07/2017   Agree w/ documentation above.  Annye Asa, MD

## 2017-03-07 NOTE — Progress Notes (Signed)
Pre visit review using our clinic review tool, if applicable. No additional management support is needed unless otherwise documented below in the visit note. 

## 2017-03-08 ENCOUNTER — Inpatient Hospital Stay
Admission: RE | Admit: 2017-03-08 | Discharge: 2017-03-08 | Disposition: A | Discharge status: Home / Self Care | Payer: Medicare Other | Source: Ambulatory Visit | Attending: Family Medicine | Admitting: Family Medicine

## 2017-03-13 ENCOUNTER — Other Ambulatory Visit: Payer: Self-pay | Admitting: Family Medicine

## 2017-03-13 ENCOUNTER — Ambulatory Visit
Admission: RE | Admit: 2017-03-13 | Discharge: 2017-03-13 | Disposition: A | Discharge status: Home / Self Care | Payer: Medicare Other | Source: Ambulatory Visit | Attending: Family Medicine | Admitting: Family Medicine

## 2017-03-13 DIAGNOSIS — R921 Mammographic calcification found on diagnostic imaging of breast: Secondary | ICD-10-CM | POA: Diagnosis not present

## 2017-03-13 DIAGNOSIS — R928 Other abnormal and inconclusive findings on diagnostic imaging of breast: Secondary | ICD-10-CM

## 2017-03-14 ENCOUNTER — Encounter: Payer: Self-pay | Admitting: Family Medicine

## 2017-03-19 ENCOUNTER — Other Ambulatory Visit (INDEPENDENT_AMBULATORY_CARE_PROVIDER_SITE_OTHER): Payer: Medicare Other

## 2017-03-19 DIAGNOSIS — E038 Other specified hypothyroidism: Secondary | ICD-10-CM | POA: Diagnosis not present

## 2017-03-19 LAB — TSH: TSH: 0.24 u[IU]/mL — ABNORMAL LOW (ref 0.35–4.50)

## 2017-03-20 ENCOUNTER — Other Ambulatory Visit: Payer: Self-pay | Admitting: General Practice

## 2017-03-20 ENCOUNTER — Encounter: Payer: Self-pay | Admitting: General Practice

## 2017-03-20 ENCOUNTER — Other Ambulatory Visit: Payer: Self-pay | Admitting: Family Medicine

## 2017-03-20 DIAGNOSIS — E038 Other specified hypothyroidism: Secondary | ICD-10-CM

## 2017-03-20 MED ORDER — LEVOTHYROXINE SODIUM 75 MCG PO TABS: 75.0000 ug | ORAL_TABLET | Freq: Every day | ORAL | 3 refills | Status: DC

## 2017-03-27 ENCOUNTER — Telehealth: Payer: Self-pay | Admitting: *Deleted

## 2017-03-27 NOTE — Telephone Encounter (Signed)
Left message for patient's husband to call to try to schedule an appointment

## 2017-03-27 NOTE — Telephone Encounter (Signed)
Patient's husband called and stated that the last few nights directly after dinner she starts shaking uncontrollably.  He stats that this happens for 10-15 minutes and after her medication it still takes about 30 minutes for it to calm down.     He contacted her provider in [place] and was told it sounds like she has an infection that he needed to call PCP.   He looked on MyChart to schedule and there was nothing available until next week.  He is asking what they need to do.

## 2017-03-27 NOTE — Telephone Encounter (Signed)
Pt needs to schedule an appt for evaluation

## 2017-03-27 NOTE — Telephone Encounter (Signed)
Spoke with patient's husband - appointment scheduled for 03/28/17 11:00

## 2017-03-28 ENCOUNTER — Encounter: Payer: Self-pay | Admitting: Family Medicine

## 2017-03-28 ENCOUNTER — Ambulatory Visit (INDEPENDENT_AMBULATORY_CARE_PROVIDER_SITE_OTHER): Payer: Medicare Other | Admitting: Family Medicine

## 2017-03-28 VITALS — BP 110/62 | HR 74 | Temp 98.2°F | Resp 14 | Ht 62.0 in | Wt 129.0 lb

## 2017-03-28 DIAGNOSIS — G8929 Other chronic pain: Secondary | ICD-10-CM | POA: Diagnosis not present

## 2017-03-28 DIAGNOSIS — M25512 Pain in left shoulder: Secondary | ICD-10-CM | POA: Diagnosis not present

## 2017-03-28 DIAGNOSIS — R251 Tremor, unspecified: Secondary | ICD-10-CM | POA: Diagnosis not present

## 2017-03-28 LAB — CBC WITH DIFFERENTIAL/PLATELET
BASOS PCT: 0.7 % (ref 0.0–3.0)
Basophils Absolute: 0 10*3/uL (ref 0.0–0.1)
EOS ABS: 0 10*3/uL (ref 0.0–0.7)
Eosinophils Relative: 0.7 % (ref 0.0–5.0)
HCT: 40.3 % (ref 36.0–46.0)
HEMOGLOBIN: 13.4 g/dL (ref 12.0–15.0)
Lymphocytes Relative: 29.3 % (ref 12.0–46.0)
Lymphs Abs: 2 10*3/uL (ref 0.7–4.0)
MCHC: 33.2 g/dL (ref 30.0–36.0)
MCV: 87.4 fl (ref 78.0–100.0)
MONO ABS: 0.5 10*3/uL (ref 0.1–1.0)
Monocytes Relative: 6.8 % (ref 3.0–12.0)
NEUTROS ABS: 4.3 10*3/uL (ref 1.4–7.7)
Neutrophils Relative %: 62.5 % (ref 43.0–77.0)
PLATELETS: 272 10*3/uL (ref 150.0–400.0)
RBC: 4.61 Mil/uL (ref 3.87–5.11)
RDW: 13.2 % (ref 11.5–15.5)
WBC: 6.8 10*3/uL (ref 4.0–10.5)

## 2017-03-28 NOTE — Progress Notes (Signed)
Pre visit review using our clinic review tool, if applicable. No additional management support is needed unless otherwise documented below in the visit note. 

## 2017-03-28 NOTE — Progress Notes (Signed)
   Subjective:    Patient ID: Melissa Douglas, female    DOB: January 01, 1941, 76 y.o.   MRN: [id number]  HPI Shaking- sxs start after dinner for the last 2-3 nights where arms will shake 'uncontrollably'.  sxs will last for ~1 hr until she takes her Carbidopa Levodopa.  Pt called Parkinson's specialist and they indicated that this could be due to infection.  Pt will take Carbidopa/Levodopa at 1- will start uncontrollably shaking at 3pm.  Will get 1/2 tab and sxs calm immediately.  Has not had her 1/2 pill the last few days.  L shoulder pain- 'for a long time'.  Husband reports he will hear popping in L shoulder, very limited ROM.  Pt will 'scream in pain'    Review of Systems For ROS see HPI     Objective:   Physical Exam  Constitutional: She is oriented to person, place, and time. She appears well-developed and well-nourished. No distress.  HENT:  Head: Normocephalic and atraumatic.  Cardiovascular: Normal rate, regular rhythm and normal heart sounds.   Pulmonary/Chest: Effort normal and breath sounds normal. No respiratory distress. She has no wheezes. She has no rales.  Musculoskeletal:  Limited ROM of L shoulder  Neurological: She is alert and oriented to person, place, and time.  Marked parkinsonian tremors of bilateral upper extremities  Skin: Skin is warm and dry.  Psychiatric: She has a normal mood and affect. Her behavior is normal. Thought content normal.  Vitals reviewed.         Assessment & Plan:  Shaking- based on description of episodes and the fact that sxs resolve w/ Carbidopa/Levodopa I suspect this is a worsening of her Parkinson's rather than infection or other possible causes.  Since Neuro mentioned possibility of infxn will get CBC to assess.  Encouraged pt to take extra 1/2 tab of Carbidopa/Levodopa daily as when she does this, she doesn't have her shaking episodes.  Will have husband report to Neuro on Monday if sxs resolve w/ increased medication.  L  shoulder pain- ongoing issue for pt.  Due to inability to lie on L side, will refer to Sports Med for evaluation and treatment as she is not likely a surgical candidate.  Pt expressed understanding and is in agreement w/ plan.

## 2017-03-28 NOTE — Patient Instructions (Signed)
Follow up as needed/scheduled We'll notify you of your lab results and make any changes if needed Take an extra 1/2 tab of Carbidopa Levodopa in the afternoons and see if the shaking improves.  I strongly believe this is a medication issue and Neuro will need to address this Call Neuro on Monday and let them know if the symptoms improve w/ extra medication We'll call you with your Sports Med appt w/ Dr Tamala Julian for the shoulder pain Call with any questions or concerns Hang in there!!!

## 2017-04-16 ENCOUNTER — Encounter: Payer: Self-pay | Admitting: Family Medicine

## 2017-04-16 ENCOUNTER — Other Ambulatory Visit (INDEPENDENT_AMBULATORY_CARE_PROVIDER_SITE_OTHER): Payer: Medicare Other

## 2017-04-16 DIAGNOSIS — E038 Other specified hypothyroidism: Secondary | ICD-10-CM

## 2017-04-16 LAB — TSH: TSH: 1.77 u[IU]/mL (ref 0.35–4.50)

## 2017-04-17 ENCOUNTER — Encounter: Payer: Self-pay | Admitting: Family Medicine

## 2017-04-17 ENCOUNTER — Encounter: Payer: Self-pay | Admitting: General Practice

## 2017-04-17 ENCOUNTER — Other Ambulatory Visit: Payer: Medicare Other

## 2017-04-17 ENCOUNTER — Other Ambulatory Visit: Payer: Self-pay | Admitting: General Practice

## 2017-04-17 MED ORDER — LEVOTHYROXINE SODIUM 75 MCG PO TABS: 75.0000 ug | ORAL_TABLET | Freq: Every day | ORAL | 1 refills | Status: DC

## 2017-04-27 NOTE — Progress Notes (Signed)
[facility] [place] [address], [place] [address] Phone: [phone] Subjective:    I'm seeing this patient by the request  of:  Midge Minium, MD   CC: Left shoulder pain  GEX:[id number]  Melissa Douglas is a 76 y.o. female coming in with complaint of left shoulder pain. This is been a chronic problem. Past medical history significant for Parkinson's disease. Having worsening pain of the left shoulder. States that unfortunately he can wake her up at night. Describes it as a dull, throbbing aching discomfort. Seems to be more associated with certain movements. Can have an audible popping. Affecting daily activities such as dressing. Does not remember any true injury.    Past Medical History:  Diagnosis Date  . Cataract Right eye   HAD SURGERY  . Chronic joint pain   . Hx of adenomatous colonic polyps 08/18/2014  . Hypothyroidism   . Multiple nasal polyps   . Osteopenia   . Parkinson disease ([place])   . Parkinson's disease [facility])    Past Surgical History:  Procedure Laterality Date  . ABDOMINAL HYSTERECTOMY     ovaries remain  . APPENDECTOMY    . CATARACT EXTRACTION    . TONSILLECTOMY     Social History   Social History  . Marital status: Married    Spouse name: [name]  . Number of children: 3  . Years of education: College   Occupational History  . Retired    Social History Main Topics  . Smoking status: Former Smoker    Types: Cigarettes    Quit date: 01/01/2001  . Smokeless tobacco: Never Used  . Alcohol use 7.2 - 8.4 oz/week    12 - 14 Glasses of wine per week     Comment: occ.  . Drug use: No  . Sexual activity: Not on file   Other Topics Concern  . Not on file   Social History Narrative   Patient lives at home with her husband and has a college education.    Caffeine- occasional use   Left-handed.   Allergies  Allergen Reactions  . Carbidopa-Levodopa Nausea And Vomiting  . Codeine     REACTION: sick  .  Ether     VERY DIZZY  . Gabapentin     REACTION: hallucinations, dizziness and vomiting  . Iodine     HIVES   No family history on file.  Past medical history, social, surgical and family history all reviewed in electronic medical record.  No pertanent information unless stated regarding to the chief complaint.   Review of Systems:Review of systems updated and as accurate as of 04/27/17  No headache, visual changes, nausea, vomiting, diarrhea, constipation, dizziness, abdominal pain, skin rash, fevers, chills, night sweats, weight loss, swollen lymph nodes, body aches, joint swelling, muscle aches, chest pain, shortness of breath, mood changes.   Objective  There were no vitals taken for this visit. Systems examined below as of 04/27/17   General: No apparent distress alert and oriented x3 mood and affect normal, dressed appropriately. Masked facies.  HEENT: Pupils equal, extraocular movements intact  Respiratory: Patient's speak in full sentences and does not appear short of breath  Cardiovascular: No lower extremity edema, non tender, no erythema  Skin: Warm dry intact with no signs of infection or rash on extremities or on axial skeleton.  Abdomen: Soft nontender  Neuro: Cranial nerves II through XII are intact, neurovascularly intact in all extremities with 2+ DTRs and 2+  pulses.  Lymph: No lymphadenopathy of posterior or anterior cervical chain or axillae bilaterally.  Gait normal with good balance and coordination.  MSK:  Non tender with full range of motion and good stability and symmetric strength and hypertonicityof  elbows, wrist, hip, knee and ankles bilaterally. Mild cogwheeling resting tremor of bilateral upper extremities Shoulder: left Mild atrophy noted on inspection Mild diffuse tenderness Cogwheeling noted on range of motion testing Rotator cuff strength normal throughout. signs of impingement with positive Neer and Hawkin's tests, but negative empty can  sign. Speeds and Yergason's tests positive. No labral pathology noted with negative Obrien's, negative clunk and good stability. Normal scapular function observed. No painful arc and no drop arm sign. Positive crossover No apprehension sign  MSK US performed of: left This study was ordered, performed, and interpreted by Charlann Boxer D.O.  Shoulder:   Supraspinatus:  Appears normal on long and transverse views, Bursal bulge seen with shoulder abduction on impingement view. Infraspinatus:  Appears normal on long and transverse views. Significant increase in Doppler flow Subscapularis:  Appears normal on long and transverse views. Positive bursa Teres Minor:  Appears normal on long and transverse views. AC joint:  Severe arthritic changes with capsulitis Glenohumeral Joint:  Appears normal without effusion. Glenoid Labrum:  Intact without visualized tears. Biceps Tendon: Hypoechoic changes within the tendon sheath and does appear the patient does have subluxation of the tendon..  Impression: Acromion clavicular arthritis and biceps subluxation  Procedure: Real-time Ultrasound Guided Injection of left acromial clavicular arthritis Device: GE Logiq E  Ultrasound guided injection is preferred based studies that show increased duration, increased effect, greater accuracy, decreased procedural pain, increased response rate with ultrasound guided versus blind injection.  Verbal informed consent obtained.  Time-out conducted.  Noted no overlying erythema, induration, or other signs of local infection.  Skin prepped in a sterile fashion.  Local anesthesia: Topical Ethyl chloride.  With sterile technique and under real time ultrasound guidance:  Joint visualized.  23g 1  inch needle inserted superior approach. Pictures taken for needle placement. Patient did have injection of 0.5 mL of 0.5% Marcaine and 0.5 mL of Kenalog 40 mg/dL into the acromioclavicular joint Completed without difficulty  Pain  immediately resolved suggesting accurate placement of the medication.  Advised to call if fevers/chills, erythema, induration, drainage, or persistent bleeding.  Images permanently stored and available for review in the ultrasound unit.  Impression: Technically successful ultrasound guided injection.  Procedure 69485; 15 minutes spent for Therapeutic exercises as stated in above notes.  This included exercises focusing on stretching, strengthening, with significant focus on eccentric aspects. Shoulder Exercises that included:  Basic scapular stabilization to include adduction and depression of scapula Scaption, focusing on proper movement and good control Internal and External rotation utilizing a theraband, with elbow tucked at side entire time Rows with theraband which was given    Proper technique shown and discussed handout in great detail with ATC.  All questions were discussed and answered.     Impression and Recommendations:     This case required medical decision making of moderate complexity.      Note: This dictation was prepared with Dragon dictation along with smaller phrase technology. Any transcriptional errors that result from this process are unintentional.

## 2017-04-30 ENCOUNTER — Ambulatory Visit: Payer: Self-pay

## 2017-04-30 ENCOUNTER — Ambulatory Visit (INDEPENDENT_AMBULATORY_CARE_PROVIDER_SITE_OTHER): Payer: Medicare Other | Admitting: Family Medicine

## 2017-04-30 ENCOUNTER — Encounter: Payer: Self-pay | Admitting: Family Medicine

## 2017-04-30 VITALS — BP 122/72 | HR 76 | Ht 62.0 in | Wt 128.0 lb

## 2017-04-30 DIAGNOSIS — M19012 Primary osteoarthritis, left shoulder: Secondary | ICD-10-CM | POA: Diagnosis not present

## 2017-04-30 DIAGNOSIS — M25512 Pain in left shoulder: Secondary | ICD-10-CM

## 2017-04-30 DIAGNOSIS — M19019 Primary osteoarthritis, unspecified shoulder: Secondary | ICD-10-CM | POA: Insufficient documentation

## 2017-04-30 NOTE — Patient Instructions (Signed)
Good to see you  Ice 20 minutes 2 times daily. Usually after activity and before bed. Exercises 3 times a week.  For the arm consider compression sleeve on the arm.  pennsaid pinkie amount topically 2 times daily as needed.  For the arthritis consider Turmeric 500mg  daily  Tart cherry extract any dose at night Keep hands within peripheral vision See me again in 4 weeks.

## 2017-04-30 NOTE — Assessment & Plan Note (Signed)
Patient was given an injection today. Tolerated the procedure well. I do think that this as well as the underlying Parkinson's is likely contributing to most of the pain. We discussed icing regimen and home exercises. Topical anti-inflammatories given. Follow-up again in 4-6 weeks.

## 2017-05-18 DIAGNOSIS — S51801A Unspecified open wound of right forearm, initial encounter: Secondary | ICD-10-CM | POA: Diagnosis not present

## 2017-05-21 DIAGNOSIS — R296 Repeated falls: Secondary | ICD-10-CM | POA: Diagnosis not present

## 2017-05-21 DIAGNOSIS — Z79899 Other long term (current) drug therapy: Secondary | ICD-10-CM | POA: Diagnosis not present

## 2017-05-21 DIAGNOSIS — R350 Frequency of micturition: Secondary | ICD-10-CM | POA: Diagnosis not present

## 2017-05-21 DIAGNOSIS — G20 Parkinson's disease: Secondary | ICD-10-CM | POA: Diagnosis not present

## 2017-05-21 DIAGNOSIS — G4700 Insomnia, unspecified: Secondary | ICD-10-CM | POA: Diagnosis not present

## 2017-05-21 DIAGNOSIS — Z87891 Personal history of nicotine dependence: Secondary | ICD-10-CM | POA: Diagnosis not present

## 2017-05-23 DIAGNOSIS — R918 Other nonspecific abnormal finding of lung field: Secondary | ICD-10-CM | POA: Diagnosis not present

## 2017-05-23 DIAGNOSIS — S4991XA Unspecified injury of right shoulder and upper arm, initial encounter: Secondary | ICD-10-CM | POA: Diagnosis not present

## 2017-05-23 DIAGNOSIS — R32 Unspecified urinary incontinence: Secondary | ICD-10-CM | POA: Diagnosis present

## 2017-05-23 DIAGNOSIS — R2681 Unsteadiness on feet: Secondary | ICD-10-CM | POA: Diagnosis not present

## 2017-05-23 DIAGNOSIS — R531 Weakness: Secondary | ICD-10-CM | POA: Diagnosis present

## 2017-05-23 DIAGNOSIS — M79621 Pain in right upper arm: Secondary | ICD-10-CM | POA: Diagnosis not present

## 2017-05-23 DIAGNOSIS — Z87891 Personal history of nicotine dependence: Secondary | ICD-10-CM | POA: Diagnosis not present

## 2017-05-23 DIAGNOSIS — R404 Transient alteration of awareness: Secondary | ICD-10-CM | POA: Diagnosis not present

## 2017-05-23 DIAGNOSIS — G20 Parkinson's disease: Secondary | ICD-10-CM | POA: Diagnosis present

## 2017-05-23 DIAGNOSIS — Z9181 History of falling: Secondary | ICD-10-CM | POA: Diagnosis not present

## 2017-05-23 DIAGNOSIS — M79622 Pain in left upper arm: Secondary | ICD-10-CM | POA: Diagnosis not present

## 2017-05-23 DIAGNOSIS — R278 Other lack of coordination: Secondary | ICD-10-CM | POA: Diagnosis not present

## 2017-05-23 DIAGNOSIS — F0280 Dementia in other diseases classified elsewhere without behavioral disturbance: Secondary | ICD-10-CM | POA: Diagnosis not present

## 2017-05-23 DIAGNOSIS — R251 Tremor, unspecified: Secondary | ICD-10-CM | POA: Diagnosis not present

## 2017-05-23 DIAGNOSIS — R4182 Altered mental status, unspecified: Secondary | ICD-10-CM | POA: Diagnosis not present

## 2017-05-23 DIAGNOSIS — R4189 Other symptoms and signs involving cognitive functions and awareness: Secondary | ICD-10-CM | POA: Diagnosis not present

## 2017-05-23 DIAGNOSIS — R441 Visual hallucinations: Secondary | ICD-10-CM | POA: Diagnosis not present

## 2017-05-23 DIAGNOSIS — Y92009 Unspecified place in unspecified non-institutional (private) residence as the place of occurrence of the external cause: Secondary | ICD-10-CM | POA: Diagnosis not present

## 2017-05-23 DIAGNOSIS — R61 Generalized hyperhidrosis: Secondary | ICD-10-CM | POA: Diagnosis present

## 2017-05-23 DIAGNOSIS — E876 Hypokalemia: Secondary | ICD-10-CM | POA: Diagnosis present

## 2017-05-23 DIAGNOSIS — R109 Unspecified abdominal pain: Secondary | ICD-10-CM | POA: Diagnosis not present

## 2017-05-23 DIAGNOSIS — R05 Cough: Secondary | ICD-10-CM | POA: Diagnosis not present

## 2017-05-23 DIAGNOSIS — R41841 Cognitive communication deficit: Secondary | ICD-10-CM | POA: Diagnosis not present

## 2017-05-23 DIAGNOSIS — K5909 Other constipation: Secondary | ICD-10-CM | POA: Diagnosis present

## 2017-05-23 DIAGNOSIS — W19XXXA Unspecified fall, initial encounter: Secondary | ICD-10-CM | POA: Diagnosis not present

## 2017-05-23 DIAGNOSIS — I10 Essential (primary) hypertension: Secondary | ICD-10-CM | POA: Diagnosis present

## 2017-05-23 DIAGNOSIS — R2689 Other abnormalities of gait and mobility: Secondary | ICD-10-CM | POA: Diagnosis not present

## 2017-05-23 DIAGNOSIS — E860 Dehydration: Secondary | ICD-10-CM | POA: Diagnosis present

## 2017-05-23 DIAGNOSIS — K529 Noninfective gastroenteritis and colitis, unspecified: Secondary | ICD-10-CM | POA: Diagnosis not present

## 2017-05-23 DIAGNOSIS — M6281 Muscle weakness (generalized): Secondary | ICD-10-CM | POA: Diagnosis not present

## 2017-05-23 DIAGNOSIS — L989 Disorder of the skin and subcutaneous tissue, unspecified: Secondary | ICD-10-CM | POA: Diagnosis not present

## 2017-05-23 DIAGNOSIS — R911 Solitary pulmonary nodule: Secondary | ICD-10-CM | POA: Diagnosis present

## 2017-05-23 DIAGNOSIS — D72829 Elevated white blood cell count, unspecified: Secondary | ICD-10-CM | POA: Diagnosis not present

## 2017-05-23 DIAGNOSIS — S4992XA Unspecified injury of left shoulder and upper arm, initial encounter: Secondary | ICD-10-CM | POA: Diagnosis not present

## 2017-05-23 DIAGNOSIS — K5289 Other specified noninfective gastroenteritis and colitis: Secondary | ICD-10-CM | POA: Diagnosis not present

## 2017-05-24 ENCOUNTER — Other Ambulatory Visit: Payer: Self-pay | Admitting: Neurology

## 2017-05-27 ENCOUNTER — Ambulatory Visit: Payer: Medicare Other | Admitting: Family Medicine

## 2017-05-28 ENCOUNTER — Ambulatory Visit: Payer: Medicare Other | Admitting: Family Medicine

## 2017-05-28 DIAGNOSIS — K529 Noninfective gastroenteritis and colitis, unspecified: Secondary | ICD-10-CM | POA: Diagnosis not present

## 2017-05-28 DIAGNOSIS — Z79899 Other long term (current) drug therapy: Secondary | ICD-10-CM | POA: Diagnosis not present

## 2017-05-28 DIAGNOSIS — I82611 Acute embolism and thrombosis of superficial veins of right upper extremity: Secondary | ICD-10-CM | POA: Diagnosis not present

## 2017-05-28 DIAGNOSIS — M7989 Other specified soft tissue disorders: Secondary | ICD-10-CM | POA: Diagnosis not present

## 2017-05-28 DIAGNOSIS — R5381 Other malaise: Secondary | ICD-10-CM | POA: Diagnosis not present

## 2017-05-28 DIAGNOSIS — R4182 Altered mental status, unspecified: Secondary | ICD-10-CM | POA: Diagnosis not present

## 2017-05-28 DIAGNOSIS — J90 Pleural effusion, not elsewhere classified: Secondary | ICD-10-CM | POA: Diagnosis not present

## 2017-05-28 DIAGNOSIS — R2689 Other abnormalities of gait and mobility: Secondary | ICD-10-CM | POA: Diagnosis not present

## 2017-05-28 DIAGNOSIS — M799 Soft tissue disorder, unspecified: Secondary | ICD-10-CM | POA: Diagnosis not present

## 2017-05-28 DIAGNOSIS — R911 Solitary pulmonary nodule: Secondary | ICD-10-CM | POA: Diagnosis not present

## 2017-05-28 DIAGNOSIS — I10 Essential (primary) hypertension: Secondary | ICD-10-CM | POA: Diagnosis not present

## 2017-05-28 DIAGNOSIS — R948 Abnormal results of function studies of other organs and systems: Secondary | ICD-10-CM | POA: Diagnosis not present

## 2017-05-28 DIAGNOSIS — Y92009 Unspecified place in unspecified non-institutional (private) residence as the place of occurrence of the external cause: Secondary | ICD-10-CM | POA: Diagnosis not present

## 2017-05-28 DIAGNOSIS — R4189 Other symptoms and signs involving cognitive functions and awareness: Secondary | ICD-10-CM | POA: Diagnosis not present

## 2017-05-28 DIAGNOSIS — E860 Dehydration: Secondary | ICD-10-CM | POA: Diagnosis not present

## 2017-05-28 DIAGNOSIS — L989 Disorder of the skin and subcutaneous tissue, unspecified: Secondary | ICD-10-CM | POA: Diagnosis not present

## 2017-05-28 DIAGNOSIS — D1771 Benign lipomatous neoplasm of kidney: Secondary | ICD-10-CM | POA: Diagnosis not present

## 2017-05-28 DIAGNOSIS — M542 Cervicalgia: Secondary | ICD-10-CM | POA: Diagnosis not present

## 2017-05-28 DIAGNOSIS — R441 Visual hallucinations: Secondary | ICD-10-CM | POA: Diagnosis not present

## 2017-05-28 DIAGNOSIS — R509 Fever, unspecified: Secondary | ICD-10-CM | POA: Diagnosis not present

## 2017-05-28 DIAGNOSIS — J3489 Other specified disorders of nose and nasal sinuses: Secondary | ICD-10-CM | POA: Diagnosis not present

## 2017-05-28 DIAGNOSIS — R61 Generalized hyperhidrosis: Secondary | ICD-10-CM | POA: Diagnosis not present

## 2017-05-28 DIAGNOSIS — R110 Nausea: Secondary | ICD-10-CM | POA: Diagnosis not present

## 2017-05-28 DIAGNOSIS — R41841 Cognitive communication deficit: Secondary | ICD-10-CM | POA: Diagnosis not present

## 2017-05-28 DIAGNOSIS — R410 Disorientation, unspecified: Secondary | ICD-10-CM | POA: Diagnosis not present

## 2017-05-28 DIAGNOSIS — R630 Anorexia: Secondary | ICD-10-CM | POA: Diagnosis not present

## 2017-05-28 DIAGNOSIS — R278 Other lack of coordination: Secondary | ICD-10-CM | POA: Diagnosis not present

## 2017-05-28 DIAGNOSIS — R0602 Shortness of breath: Secondary | ICD-10-CM | POA: Diagnosis not present

## 2017-05-28 DIAGNOSIS — G20 Parkinson's disease: Secondary | ICD-10-CM | POA: Diagnosis not present

## 2017-05-28 DIAGNOSIS — R531 Weakness: Secondary | ICD-10-CM | POA: Diagnosis not present

## 2017-05-28 DIAGNOSIS — M25562 Pain in left knee: Secondary | ICD-10-CM | POA: Diagnosis not present

## 2017-05-28 DIAGNOSIS — R2681 Unsteadiness on feet: Secondary | ICD-10-CM | POA: Diagnosis not present

## 2017-05-28 DIAGNOSIS — Z9181 History of falling: Secondary | ICD-10-CM | POA: Diagnosis not present

## 2017-05-28 DIAGNOSIS — R6883 Chills (without fever): Secondary | ICD-10-CM | POA: Diagnosis not present

## 2017-05-28 DIAGNOSIS — K5289 Other specified noninfective gastroenteritis and colitis: Secondary | ICD-10-CM | POA: Diagnosis not present

## 2017-05-28 DIAGNOSIS — Z87891 Personal history of nicotine dependence: Secondary | ICD-10-CM | POA: Diagnosis not present

## 2017-05-28 DIAGNOSIS — R918 Other nonspecific abnormal finding of lung field: Secondary | ICD-10-CM | POA: Diagnosis not present

## 2017-05-28 DIAGNOSIS — M6281 Muscle weakness (generalized): Secondary | ICD-10-CM | POA: Diagnosis not present

## 2017-05-28 DIAGNOSIS — I700 Atherosclerosis of aorta: Secondary | ICD-10-CM | POA: Diagnosis not present

## 2017-05-28 DIAGNOSIS — R112 Nausea with vomiting, unspecified: Secondary | ICD-10-CM | POA: Diagnosis not present

## 2017-05-28 DIAGNOSIS — R413 Other amnesia: Secondary | ICD-10-CM | POA: Diagnosis not present

## 2017-05-28 DIAGNOSIS — M25561 Pain in right knee: Secondary | ICD-10-CM | POA: Diagnosis not present

## 2017-05-28 DIAGNOSIS — E039 Hypothyroidism, unspecified: Secondary | ICD-10-CM | POA: Diagnosis not present

## 2017-05-28 DIAGNOSIS — W19XXXA Unspecified fall, initial encounter: Secondary | ICD-10-CM | POA: Diagnosis not present

## 2017-05-30 DIAGNOSIS — R918 Other nonspecific abnormal finding of lung field: Secondary | ICD-10-CM | POA: Diagnosis not present

## 2017-05-30 DIAGNOSIS — R5381 Other malaise: Secondary | ICD-10-CM | POA: Diagnosis not present

## 2017-05-30 DIAGNOSIS — G20 Parkinson's disease: Secondary | ICD-10-CM | POA: Diagnosis not present

## 2017-05-30 DIAGNOSIS — K529 Noninfective gastroenteritis and colitis, unspecified: Secondary | ICD-10-CM | POA: Diagnosis not present

## 2017-06-03 DIAGNOSIS — R630 Anorexia: Secondary | ICD-10-CM | POA: Diagnosis not present

## 2017-06-03 DIAGNOSIS — R112 Nausea with vomiting, unspecified: Secondary | ICD-10-CM | POA: Diagnosis not present

## 2017-06-04 ENCOUNTER — Ambulatory Visit: Payer: Medicare Other | Admitting: Family Medicine

## 2017-06-05 DIAGNOSIS — R112 Nausea with vomiting, unspecified: Secondary | ICD-10-CM | POA: Diagnosis not present

## 2017-06-10 DIAGNOSIS — R630 Anorexia: Secondary | ICD-10-CM | POA: Diagnosis not present

## 2017-06-11 ENCOUNTER — Emergency Department (HOSPITAL_COMMUNITY): Payer: Medicare Other

## 2017-06-11 ENCOUNTER — Emergency Department (HOSPITAL_COMMUNITY)
Admission: EM | Admit: 2017-06-11 | Discharge: 2017-06-12 | Disposition: A | Discharge status: Home / Self Care | Payer: Medicare Other | Attending: Physician Assistant | Admitting: Physician Assistant

## 2017-06-11 ENCOUNTER — Encounter (HOSPITAL_COMMUNITY): Payer: Self-pay

## 2017-06-11 DIAGNOSIS — R0602 Shortness of breath: Secondary | ICD-10-CM | POA: Diagnosis not present

## 2017-06-11 DIAGNOSIS — R6883 Chills (without fever): Secondary | ICD-10-CM | POA: Insufficient documentation

## 2017-06-11 DIAGNOSIS — G20 Parkinson's disease: Secondary | ICD-10-CM | POA: Insufficient documentation

## 2017-06-11 DIAGNOSIS — R531 Weakness: Secondary | ICD-10-CM | POA: Insufficient documentation

## 2017-06-11 DIAGNOSIS — R413 Other amnesia: Secondary | ICD-10-CM | POA: Diagnosis not present

## 2017-06-11 DIAGNOSIS — E039 Hypothyroidism, unspecified: Secondary | ICD-10-CM | POA: Insufficient documentation

## 2017-06-11 DIAGNOSIS — M6281 Muscle weakness (generalized): Secondary | ICD-10-CM | POA: Diagnosis not present

## 2017-06-11 DIAGNOSIS — Z79899 Other long term (current) drug therapy: Secondary | ICD-10-CM | POA: Diagnosis not present

## 2017-06-11 DIAGNOSIS — Z87891 Personal history of nicotine dependence: Secondary | ICD-10-CM | POA: Insufficient documentation

## 2017-06-11 LAB — URINALYSIS, ROUTINE W REFLEX MICROSCOPIC
BILIRUBIN URINE: NEGATIVE
Glucose, UA: NEGATIVE mg/dL
Hgb urine dipstick: NEGATIVE
KETONES UR: 5 mg/dL — AB
LEUKOCYTES UA: NEGATIVE
NITRITE: NEGATIVE
PROTEIN: NEGATIVE mg/dL
Specific Gravity, Urine: 1.013 (ref 1.005–1.030)
pH: 6 (ref 5.0–8.0)

## 2017-06-11 LAB — CBC WITH DIFFERENTIAL/PLATELET
BASOS ABS: 0.1 10*3/uL (ref 0.0–0.1)
BASOS PCT: 1 %
EOS ABS: 0.1 10*3/uL (ref 0.0–0.7)
EOS PCT: 1 %
HCT: 37.1 % (ref 36.0–46.0)
Hemoglobin: 12 g/dL (ref 12.0–15.0)
LYMPHS PCT: 24 %
Lymphs Abs: 1.8 10*3/uL (ref 0.7–4.0)
MCH: 28 pg (ref 26.0–34.0)
MCHC: 32.3 g/dL (ref 30.0–36.0)
MCV: 86.7 fL (ref 78.0–100.0)
Monocytes Absolute: 0.8 10*3/uL (ref 0.1–1.0)
Monocytes Relative: 11 %
Neutro Abs: 4.8 10*3/uL (ref 1.7–7.7)
Neutrophils Relative %: 63 %
PLATELETS: 297 10*3/uL (ref 150–400)
RBC: 4.28 MIL/uL (ref 3.87–5.11)
RDW: 14 % (ref 11.5–15.5)
WBC: 7.5 10*3/uL (ref 4.0–10.5)

## 2017-06-11 LAB — COMPREHENSIVE METABOLIC PANEL
ALT: <5 U/L — ABNORMAL LOW (ref 14–54)
AST: 14 U/L — AB (ref 15–41)
Albumin: 3.2 g/dL — ABNORMAL LOW (ref 3.5–5.0)
Alkaline Phosphatase: 33 U/L — ABNORMAL LOW (ref 38–126)
Anion gap: 7 (ref 5–15)
BILIRUBIN TOTAL: 0.6 mg/dL (ref 0.3–1.2)
BUN: 14 mg/dL (ref 6–20)
CALCIUM: 9.6 mg/dL (ref 8.9–10.3)
CO2: 28 mmol/L (ref 22–32)
CREATININE: 0.53 mg/dL (ref 0.44–1.00)
Chloride: 102 mmol/L (ref 101–111)
GFR calc Af Amer: >60 mL/min (ref >60)
Glucose, Bld: 103 mg/dL — ABNORMAL HIGH (ref 65–99)
POTASSIUM: 3.4 mmol/L — AB (ref 3.5–5.1)
Sodium: 137 mmol/L (ref 135–145)
TOTAL PROTEIN: 5.8 g/dL — AB (ref 6.5–8.1)

## 2017-06-11 LAB — I-STAT CG4 LACTIC ACID, ED: Lactic Acid, Venous: 0.78 mmol/L (ref 0.5–1.9)

## 2017-06-11 LAB — TROPONIN I

## 2017-06-11 MED ORDER — ONDANSETRON HCL 4 MG/2ML IJ SOLN
4.0000 mg | Freq: Once | INTRAMUSCULAR | Status: AC
  Administered 2017-06-11: 4 mg via INTRAVENOUS
  Filled 2017-06-11: qty 2

## 2017-06-11 MED ORDER — SODIUM CHLORIDE 0.9 % IV BOLUS (SEPSIS)
1000.0000 mL | Freq: Once | INTRAVENOUS | Status: AC
  Administered 2017-06-11: 1000 mL via INTRAVENOUS

## 2017-06-11 MED ORDER — MORPHINE SULFATE (PF) 4 MG/ML IV SOLN
4.0000 mg | Freq: Once | INTRAVENOUS | Status: AC
  Administered 2017-06-11: 4 mg via INTRAVENOUS
  Filled 2017-06-11: qty 1

## 2017-06-11 NOTE — ED Notes (Signed)
Patient transported to X-ray 

## 2017-06-11 NOTE — ED Triage Notes (Signed)
Pt BIB [facility] from [facility] per patient request. Pt stated she felt as if she needed to go to the hospital. Pt had recent Sepsis at [facility] 2 weeks prior, received antibiotics X10 days. Pt reports chills, shakes, and nausea today. Family at bedside reports septic from lung and GI.

## 2017-06-11 NOTE — ED Provider Notes (Signed)
[facility] DEPT Provider Note   CSN: [id number] Arrival date & time: 06/11/17  1921     History   Chief Complaint Chief Complaint  Patient presents with  . Weakness  . Chills    HPI Melissa Douglas is a 76 y.o. female.  HPI   76 year old female with history of Parkinson presenting with concerns of chills or weakness. Husband at bedside provide most of the history.  Patient was initially seen at an outside hospital on 05/23/17 for fall. She was found to be dehydrated, CT scan of abdomen and pelvis showing cystic or colitis which she was treated with Cipro and Flagyl, she has a right upper lobe lung mass requiring outpatient evaluation. She was discharged on 05/28/2017. Patient discharged to a rehabilitation facility. She recently finished her Cipro and Flagyl several days prior. Today patient while sitting, complaining of generalized body aches, and chills, with shakiness and having shortness of breath. She feels that she could not catch her breath. She was given her Parkinson medication as well as the pain medication was brought to the ER for further radiation. While waiting the ER, symptoms seems to be improving. Currently she is not in any acute pain. Patient denies any productive cough, or fever, no increased abdominal pain. No report of constipation or diarrhea. No complaints of dysuria. She still is having night sweats. There was a mass noted on the CT scan from prior hospitalization.She has not had a PET scan of the chest that was previously schedule.   Past Medical History:  Diagnosis Date  . Cataract Right eye   HAD SURGERY  . Chronic joint pain   . Hx of adenomatous colonic polyps 08/18/2014  . Hypothyroidism   . Multiple nasal polyps   . Osteopenia   . Parkinson disease ([place])   . Parkinson's disease [facility])     Patient Active Problem List   Diagnosis Date Noted  . 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 Memory loss 12/13/2014  . Soft tissue mass 12/13/2014    . Recent skin changes 12/13/2014  . Hx of adenomatous colonic polyps 08/18/2014  . Fungal dermatitis 03/19/2014  . Overweight 03/19/2014  . Cataract   . Constipation, slow transit 09/03/2012  . Seasonal allergic rhinitis 04/18/2012  . General medical examination 05/18/2011  . Elevated BP 03/13/2011  . Vaginal dryness, menopausal 03/13/2011  . Vitamin D deficiency 03/13/2011  . Hypertriglyceridemia 03/13/2011  . OSTEOPENIA 05/31/2010  . Parkinsons disease ([place]) 04/07/2010  . POSTMENOPAUSAL STATUS 04/07/2010  . 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 DRY MOUTH 03/07/2010  . 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 PARESTHESIA 01/26/2009  . Hypothyroidism 10/20/2008  . 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 ABDOMINAL HYSTERECTOMY     ovaries remain  . APPENDECTOMY    . CATARACT EXTRACTION    . TONSILLECTOMY      OB History    No data available       Home Medications    Prior to Admission medications   Medication Sig Start Date End Date Taking? Authorizing Provider  acetaminophen (TYLENOL) 500 MG tablet Take 500-1,000 mg by mouth every 6 (six) hours as needed for pain.     [provider]  Carbidopa-Levodopa ER (SINEMET CR) 25-100 MG tablet controlled release Take 1 tablet by mouth 3 (three) times daily.     [provider]  cholecalciferol (VITAMIN D) 1000 units tablet Take 2,000 Units by mouth daily.  [provider]  Donepezil HCl (ARICEPT PO) Take 25 mg by mouth daily.     [provider]  Doxylamine Succinate, Sleep, (UNISOM PO) Take by mouth.    [provider]  ipratropium (ATROVENT) 0.06 % nasal spray Place into the nose. 03/05/17 04/04/17  [provider]  levothyroxine (SYNTHROID, LEVOTHROID) 75 MCG tablet Take 1 tablet (75 mcg total) by mouth daily. 04/17/17   Midge Minium, MD  QUEtiapine (SEROQUEL) 25 MG tablet Take 25 mg by mouth at bedtime.    [provider]  rasagiline (AZILECT) 1 MG TABS tablet Take 1 tablet (1 mg total) by mouth daily. Please call [phone] to schedule an appt for continued refills. 02/18/17   Marcial Pacas, MD    Family History No family history on file.  Social History Social History  Substance Use Topics  . Smoking status: Former Smoker    Types: Cigarettes    Quit date: 01/01/2001  . Smokeless tobacco: Never Used  . Alcohol use No     Comment: not since being in the facility      Allergies   Carbidopa-levodopa; Codeine; Ether; Gabapentin; and Iodine   Review of Systems Review of Systems  All other systems reviewed and are negative.    Physical Exam Updated Vital Signs BP (!) 124/58 (BP Location: Right Arm)   Pulse 81   Temp 98.7 F (37.1 C) (Oral)   Resp 14   Ht 5\' 2"  (1.575 m)   Wt 50.3 kg (111 lb)   SpO2 97%   BMI 20.30 kg/m   Physical Exam  Constitutional:  Ill-appearing female resting then  HENT:  Mouth is dry  Eyes: Pupils are equal, round, and reactive to light. EOM are normal.  Neck:  No nuchal rigidity  Cardiovascular:  Irregular heart rhythm without murmurs rubs or gallops  Pulmonary/Chest:  Poor effort, faint crackles heard at lower lung base  Abdominal: Soft. She exhibits no distension. There is no tenderness.  Nursing note and vitals reviewed.    ED Treatments / Results  Labs (all labs ordered are listed, but only abnormal results are displayed) Labs Reviewed - No data to display  EKG  EKG Interpretation None       Radiology Dg Chest 2 View  Result Date: 06/11/2017 CLINICAL DATA:  Chills and weakness EXAM: CHEST  2 VIEW COMPARISON:  Chest radiograph 04/19/2016 FINDINGS: The heart size and mediastinal contours are within normal limits. There is calcific aortic atherosclerosis. Both lungs are clear. The visualized skeletal structures are unremarkable. IMPRESSION: No active cardiopulmonary disease. Electronically Signed   By: Ulyses Jarred M.D.   On:  06/11/2017 22:02    Procedures Procedures (including critical care time)  Medications Ordered in ED Medications  sodium chloride 0.9 % bolus 1,000 mL (0 mLs Intravenous Stopped 06/11/17 2312)  morphine 4 MG/ML injection 4 mg (4 mg Intravenous Given 06/11/17 2117)  ondansetron (ZOFRAN) injection 4 mg (4 mg Intravenous Given 06/11/17 2117)  morphine 4 MG/ML injection 2 mg (2 mg Intravenous Given 06/12/17 0019)     Initial Impression / Assessment and Plan / ED Course  I have reviewed the triage vital signs and the nursing notes.  Pertinent labs & imaging results that were available during my care of the patient were reviewed by me and considered in my medical decision making (see chart for details).     BP (!) 142/70   Pulse 76   Temp 97.7 F (36.5 C) (Oral)   Resp  14   Ht 5\' 2"  (1.575 m)   Wt 50.3 kg (111 lb)   SpO2 100%   BMI 20.30 kg/m    Final Clinical Impressions(s) / ED Diagnoses   Final diagnoses:  Weakness    New Prescriptions New Prescriptions   No medications on file   9:01 PM Patient here for myalgias, chills, night sweats. Was treated for colitis with Cipro and Flagyl several weeks prior, recently finished her antibiotic. She is currently afebrile, vital signs stable. History of Parkinson. Workup initiated.  12:38 AM Workup unremarkable.  Reassurance given.  Suspect tremor likely 2/2 to her Parkinson, which improves after she took Sinemet.  No evidence of infection.  No signs of ACS.  Stable for discharge.  Care discussed with Dr. Thomasene Lot.   Domenic Moras, PA-C 06/12/17 1324    Macarthur Critchley, MD 06/12/17 [phone]

## 2017-06-12 MED ORDER — MORPHINE SULFATE (PF) 4 MG/ML IV SOLN
2.0000 mg | Freq: Once | INTRAVENOUS | Status: AC
  Administered 2017-06-12: 2 mg via INTRAVENOUS
  Filled 2017-06-12: qty 1

## 2017-06-12 NOTE — ED Notes (Signed)
[facility] calles for transport

## 2017-06-12 NOTE — Discharge Instructions (Signed)
Please follow up closely with your doctor for further evaluation of your condition.  Return if you have any concerns.

## 2017-06-12 NOTE — ED Notes (Signed)
Pt stable, states understanding of discharge instructions, family at bedside, [facility] to transport

## 2017-06-18 DIAGNOSIS — R413 Other amnesia: Secondary | ICD-10-CM | POA: Diagnosis not present

## 2017-06-18 DIAGNOSIS — G20 Parkinson's disease: Secondary | ICD-10-CM | POA: Diagnosis not present

## 2017-06-18 DIAGNOSIS — R918 Other nonspecific abnormal finding of lung field: Secondary | ICD-10-CM | POA: Diagnosis not present

## 2017-06-20 DIAGNOSIS — R630 Anorexia: Secondary | ICD-10-CM | POA: Diagnosis not present

## 2017-06-20 DIAGNOSIS — M7989 Other specified soft tissue disorders: Secondary | ICD-10-CM | POA: Diagnosis not present

## 2017-06-20 DIAGNOSIS — G20 Parkinson's disease: Secondary | ICD-10-CM | POA: Diagnosis not present

## 2017-06-20 DIAGNOSIS — Z79899 Other long term (current) drug therapy: Secondary | ICD-10-CM | POA: Diagnosis not present

## 2017-06-21 DIAGNOSIS — I82611 Acute embolism and thrombosis of superficial veins of right upper extremity: Secondary | ICD-10-CM | POA: Diagnosis not present

## 2017-06-21 DIAGNOSIS — R918 Other nonspecific abnormal finding of lung field: Secondary | ICD-10-CM | POA: Diagnosis not present

## 2017-06-24 DIAGNOSIS — R410 Disorientation, unspecified: Secondary | ICD-10-CM | POA: Diagnosis not present

## 2017-06-25 DIAGNOSIS — D1771 Benign lipomatous neoplasm of kidney: Secondary | ICD-10-CM | POA: Diagnosis not present

## 2017-06-25 DIAGNOSIS — R918 Other nonspecific abnormal finding of lung field: Secondary | ICD-10-CM | POA: Diagnosis not present

## 2017-06-25 DIAGNOSIS — J90 Pleural effusion, not elsewhere classified: Secondary | ICD-10-CM | POA: Diagnosis not present

## 2017-06-25 DIAGNOSIS — R911 Solitary pulmonary nodule: Secondary | ICD-10-CM | POA: Diagnosis not present

## 2017-06-25 DIAGNOSIS — I700 Atherosclerosis of aorta: Secondary | ICD-10-CM | POA: Diagnosis not present

## 2017-06-28 DIAGNOSIS — M799 Soft tissue disorder, unspecified: Secondary | ICD-10-CM | POA: Diagnosis not present

## 2017-06-28 DIAGNOSIS — R911 Solitary pulmonary nodule: Secondary | ICD-10-CM | POA: Diagnosis not present

## 2017-06-28 DIAGNOSIS — G20 Parkinson's disease: Secondary | ICD-10-CM | POA: Diagnosis not present

## 2017-07-03 DIAGNOSIS — G20 Parkinson's disease: Secondary | ICD-10-CM | POA: Diagnosis not present

## 2017-07-08 DIAGNOSIS — R948 Abnormal results of function studies of other organs and systems: Secondary | ICD-10-CM | POA: Diagnosis not present

## 2017-07-08 DIAGNOSIS — E039 Hypothyroidism, unspecified: Secondary | ICD-10-CM | POA: Diagnosis not present

## 2017-07-08 DIAGNOSIS — G20 Parkinson's disease: Secondary | ICD-10-CM | POA: Diagnosis not present

## 2017-07-08 DIAGNOSIS — R911 Solitary pulmonary nodule: Secondary | ICD-10-CM | POA: Diagnosis not present

## 2017-07-08 DIAGNOSIS — Z87891 Personal history of nicotine dependence: Secondary | ICD-10-CM | POA: Diagnosis not present

## 2017-07-24 DIAGNOSIS — J3489 Other specified disorders of nose and nasal sinuses: Secondary | ICD-10-CM | POA: Diagnosis not present

## 2017-08-01 DIAGNOSIS — R2681 Unsteadiness on feet: Secondary | ICD-10-CM | POA: Diagnosis not present

## 2017-08-01 DIAGNOSIS — M24572 Contracture, left ankle: Secondary | ICD-10-CM | POA: Diagnosis not present

## 2017-08-01 DIAGNOSIS — M6281 Muscle weakness (generalized): Secondary | ICD-10-CM | POA: Diagnosis not present

## 2017-08-05 DIAGNOSIS — M24572 Contracture, left ankle: Secondary | ICD-10-CM | POA: Diagnosis not present

## 2017-08-05 DIAGNOSIS — M6281 Muscle weakness (generalized): Secondary | ICD-10-CM | POA: Diagnosis not present

## 2017-08-05 DIAGNOSIS — R2681 Unsteadiness on feet: Secondary | ICD-10-CM | POA: Diagnosis not present

## 2017-08-07 DIAGNOSIS — R2681 Unsteadiness on feet: Secondary | ICD-10-CM | POA: Diagnosis not present

## 2017-08-07 DIAGNOSIS — M24572 Contracture, left ankle: Secondary | ICD-10-CM | POA: Diagnosis not present

## 2017-08-07 DIAGNOSIS — M6281 Muscle weakness (generalized): Secondary | ICD-10-CM | POA: Diagnosis not present

## 2017-08-09 DIAGNOSIS — M6281 Muscle weakness (generalized): Secondary | ICD-10-CM | POA: Diagnosis not present

## 2017-08-09 DIAGNOSIS — R2681 Unsteadiness on feet: Secondary | ICD-10-CM | POA: Diagnosis not present

## 2017-08-09 DIAGNOSIS — M24572 Contracture, left ankle: Secondary | ICD-10-CM | POA: Diagnosis not present

## 2017-08-12 DIAGNOSIS — M24572 Contracture, left ankle: Secondary | ICD-10-CM | POA: Diagnosis not present

## 2017-08-12 DIAGNOSIS — M6281 Muscle weakness (generalized): Secondary | ICD-10-CM | POA: Diagnosis not present

## 2017-08-13 DIAGNOSIS — M24572 Contracture, left ankle: Secondary | ICD-10-CM | POA: Diagnosis not present

## 2017-08-13 DIAGNOSIS — M6281 Muscle weakness (generalized): Secondary | ICD-10-CM | POA: Diagnosis not present

## 2017-08-16 DIAGNOSIS — M6281 Muscle weakness (generalized): Secondary | ICD-10-CM | POA: Diagnosis not present

## 2017-08-16 DIAGNOSIS — M24572 Contracture, left ankle: Secondary | ICD-10-CM | POA: Diagnosis not present

## 2017-08-19 DIAGNOSIS — M24572 Contracture, left ankle: Secondary | ICD-10-CM | POA: Diagnosis not present

## 2017-08-19 DIAGNOSIS — M6281 Muscle weakness (generalized): Secondary | ICD-10-CM | POA: Diagnosis not present

## 2017-08-20 DIAGNOSIS — M6281 Muscle weakness (generalized): Secondary | ICD-10-CM | POA: Diagnosis not present

## 2017-08-20 DIAGNOSIS — M24572 Contracture, left ankle: Secondary | ICD-10-CM | POA: Diagnosis not present

## 2017-08-21 DIAGNOSIS — M6281 Muscle weakness (generalized): Secondary | ICD-10-CM | POA: Diagnosis not present

## 2017-08-21 DIAGNOSIS — M24572 Contracture, left ankle: Secondary | ICD-10-CM | POA: Diagnosis not present

## 2017-08-26 DIAGNOSIS — M6281 Muscle weakness (generalized): Secondary | ICD-10-CM | POA: Diagnosis not present

## 2017-08-26 DIAGNOSIS — M24572 Contracture, left ankle: Secondary | ICD-10-CM | POA: Diagnosis not present

## 2017-08-27 DIAGNOSIS — M6281 Muscle weakness (generalized): Secondary | ICD-10-CM | POA: Diagnosis not present

## 2017-08-27 DIAGNOSIS — M24572 Contracture, left ankle: Secondary | ICD-10-CM | POA: Diagnosis not present

## 2017-08-28 DIAGNOSIS — M6281 Muscle weakness (generalized): Secondary | ICD-10-CM | POA: Diagnosis not present

## 2017-08-28 DIAGNOSIS — M24572 Contracture, left ankle: Secondary | ICD-10-CM | POA: Diagnosis not present

## 2017-08-29 DIAGNOSIS — M24572 Contracture, left ankle: Secondary | ICD-10-CM | POA: Diagnosis not present

## 2017-08-29 DIAGNOSIS — M6281 Muscle weakness (generalized): Secondary | ICD-10-CM | POA: Diagnosis not present

## 2017-08-30 DIAGNOSIS — M6281 Muscle weakness (generalized): Secondary | ICD-10-CM | POA: Diagnosis not present

## 2017-08-30 DIAGNOSIS — M24572 Contracture, left ankle: Secondary | ICD-10-CM | POA: Diagnosis not present

## 2017-09-04 DIAGNOSIS — M6281 Muscle weakness (generalized): Secondary | ICD-10-CM | POA: Diagnosis not present

## 2017-09-04 DIAGNOSIS — M24572 Contracture, left ankle: Secondary | ICD-10-CM | POA: Diagnosis not present

## 2017-09-06 DIAGNOSIS — M6281 Muscle weakness (generalized): Secondary | ICD-10-CM | POA: Diagnosis not present

## 2017-09-06 DIAGNOSIS — M24572 Contracture, left ankle: Secondary | ICD-10-CM | POA: Diagnosis not present

## 2017-09-09 ENCOUNTER — Telehealth: Payer: Self-pay | Admitting: Family Medicine

## 2017-09-09 ENCOUNTER — Emergency Department (HOSPITAL_COMMUNITY): Payer: Medicare Other

## 2017-09-09 ENCOUNTER — Emergency Department (HOSPITAL_COMMUNITY)
Admission: EM | Admit: 2017-09-09 | Discharge: 2017-09-09 | Disposition: A | Discharge status: Home / Self Care | Payer: Medicare Other | Attending: Emergency Medicine | Admitting: Emergency Medicine

## 2017-09-09 ENCOUNTER — Encounter (HOSPITAL_COMMUNITY): Payer: Self-pay | Admitting: Emergency Medicine

## 2017-09-09 DIAGNOSIS — Z87891 Personal history of nicotine dependence: Secondary | ICD-10-CM | POA: Insufficient documentation

## 2017-09-09 DIAGNOSIS — Z79899 Other long term (current) drug therapy: Secondary | ICD-10-CM | POA: Insufficient documentation

## 2017-09-09 DIAGNOSIS — E039 Hypothyroidism, unspecified: Secondary | ICD-10-CM | POA: Insufficient documentation

## 2017-09-09 DIAGNOSIS — M8580 Other specified disorders of bone density and structure, unspecified site: Secondary | ICD-10-CM | POA: Insufficient documentation

## 2017-09-09 DIAGNOSIS — H9313 Tinnitus, bilateral: Secondary | ICD-10-CM | POA: Diagnosis not present

## 2017-09-09 DIAGNOSIS — H9193 Unspecified hearing loss, bilateral: Secondary | ICD-10-CM | POA: Diagnosis present

## 2017-09-09 DIAGNOSIS — R51 Headache: Secondary | ICD-10-CM | POA: Diagnosis not present

## 2017-09-09 DIAGNOSIS — H6123 Impacted cerumen, bilateral: Secondary | ICD-10-CM | POA: Insufficient documentation

## 2017-09-09 DIAGNOSIS — G20 Parkinson's disease: Secondary | ICD-10-CM | POA: Insufficient documentation

## 2017-09-09 DIAGNOSIS — R9431 Abnormal electrocardiogram [ECG] [EKG]: Secondary | ICD-10-CM | POA: Diagnosis not present

## 2017-09-09 LAB — DIFFERENTIAL
BASOS PCT: 0 %
Basophils Absolute: 0 10*3/uL (ref 0.0–0.1)
EOS ABS: 0.1 10*3/uL (ref 0.0–0.7)
EOS PCT: 2 %
Lymphocytes Relative: 22 %
Lymphs Abs: 2 10*3/uL (ref 0.7–4.0)
MONO ABS: 0.6 10*3/uL (ref 0.1–1.0)
Monocytes Relative: 7 %
NEUTROS ABS: 6.2 10*3/uL (ref 1.7–7.7)
Neutrophils Relative %: 69 %

## 2017-09-09 LAB — COMPREHENSIVE METABOLIC PANEL
ALT: <5 U/L — ABNORMAL LOW (ref 14–54)
ANION GAP: 10 (ref 5–15)
AST: 14 U/L — ABNORMAL LOW (ref 15–41)
Albumin: 3.6 g/dL (ref 3.5–5.0)
Alkaline Phosphatase: 45 U/L (ref 38–126)
BUN: 11 mg/dL (ref 6–20)
CHLORIDE: 104 mmol/L (ref 101–111)
CO2: 24 mmol/L (ref 22–32)
CREATININE: 0.52 mg/dL (ref 0.44–1.00)
Calcium: 9.7 mg/dL (ref 8.9–10.3)
Glucose, Bld: 98 mg/dL (ref 65–99)
Potassium: 3.7 mmol/L (ref 3.5–5.1)
SODIUM: 138 mmol/L (ref 135–145)
Total Bilirubin: 0.5 mg/dL (ref 0.3–1.2)
Total Protein: 6.1 g/dL — ABNORMAL LOW (ref 6.5–8.1)

## 2017-09-09 LAB — PROTIME-INR
INR: 0.97
Prothrombin Time: 12.8 seconds (ref 11.4–15.2)

## 2017-09-09 LAB — CBC
HCT: 38 % (ref 36.0–46.0)
HEMOGLOBIN: 12 g/dL (ref 12.0–15.0)
MCH: 27.5 pg (ref 26.0–34.0)
MCHC: 31.6 g/dL (ref 30.0–36.0)
MCV: 87 fL (ref 78.0–100.0)
Platelets: 301 10*3/uL (ref 150–400)
RBC: 4.37 MIL/uL (ref 3.87–5.11)
RDW: 15.2 % (ref 11.5–15.5)
WBC: 9 10*3/uL (ref 4.0–10.5)

## 2017-09-09 LAB — I-STAT TROPONIN, ED: TROPONIN I, POC: 0 ng/mL (ref 0.00–0.08)

## 2017-09-09 LAB — I-STAT CHEM 8, ED
BUN: 13 mg/dL (ref 6–20)
CALCIUM ION: 1.13 mmol/L — AB (ref 1.15–1.40)
CHLORIDE: 103 mmol/L (ref 101–111)
Creatinine, Ser: 0.5 mg/dL (ref 0.44–1.00)
Glucose, Bld: 96 mg/dL (ref 65–99)
HCT: 36 % (ref 36.0–46.0)
HEMOGLOBIN: 12.2 g/dL (ref 12.0–15.0)
POTASSIUM: 3.7 mmol/L (ref 3.5–5.1)
SODIUM: 138 mmol/L (ref 135–145)
TCO2: 25 mmol/L (ref 22–32)

## 2017-09-09 LAB — APTT: APTT: 26 s (ref 24–36)

## 2017-09-09 NOTE — Telephone Encounter (Signed)
Pt was scheduled for start of care evaluation today.  However, when therapist called to notify pt he was on the way, pt's husband informed him they were currently in the hospital as the patient is experiencing acute loss of hearing.  Pt's PT evaluation has been rescheduled to 09/11/17.  He wants to know if pcp is ok with this.

## 2017-09-09 NOTE — ED Triage Notes (Addendum)
Pt to ER for intermittent hearing loss onset yesterday at noon. Caregiver states patient has been treated for a UTI on Macrobid over the last week, just finished antibiotics. VSS. Patient denies pain. Hx of parkinsons

## 2017-09-09 NOTE — Telephone Encounter (Signed)
Please see if you can get more info from pt or husband.  I'm not sure if he's relating the hearing loss to the UTI or the antibiotics or what the thought process is, but if she has had an acute change (hearing loss) that was not there before, I would recommend an evaluation ASAP (and that may mean the ER)

## 2017-09-09 NOTE — ED Notes (Signed)
Pt verbalized understanding of d/c instructions and has no further questions. Pt is stable, A&Ox4, VSS.  

## 2017-09-09 NOTE — Telephone Encounter (Signed)
Patient's husband states that patient previously had a lung infection and she became septic and they put her on Cipro and Flagyl.  He states that last Tuesday (09/03/17) before release from rehab, she got sick and they did a urine sample.   The results were "lost" but then the facility said they found them and that it showed positive uti, but <100,000 and she was put on Macrobid for 7 days and she took her last pill last night.  She got up yesterday morning and could not hear anything he was saying.  She told him that all she could do was very faintly hear him.   He said that this seems to be coming and going but that this was all of a sudden hearing loss.  Thinking that maybe it was just his tone of voice that she wasn't hearing, when the caregiver arrived this morning he had her go in and talk to patient. She could not hear the caregiver either.   He is going to take her to the ER for evaluation because it worries him that this happened so suddenly.   Advised patient that this would be the best plan.  Advised him to call back if he had any other questions/concerns.

## 2017-09-09 NOTE — ED Notes (Signed)
Pt ears cleaned out and pt states she is able to hear better at this time

## 2017-09-09 NOTE — Telephone Encounter (Signed)
Agree w/ advice given 

## 2017-09-09 NOTE — ED Provider Notes (Signed)
[facility] EMERGENCY DEPARTMENT Provider Note   CSN: [id number] Arrival date & time: 09/09/17  1430     History   Chief Complaint Chief Complaint  Patient presents with  . Hearing Loss    HPI Melissa Douglas is a 76 y.o. female.  HPI  76 year old female presents with acute hearing loss.  Started yesterday in the afternoon.  Patient at one point noticed a ringing sound very briefly in the right ear that sounded like a doorbell but that went away.  Some discomfort in her right ear.  She has been having a headache on the right side on and off for several days since she was in rehab.  She got out of rehab yesterday and finished up antibiotics (Macrobid) for a UTI yesterday.  Patient denies any current ringing in her ears.  There is no blurry vision, weakness, numbness. She states the right ear is worse than the left. Thinks she saw some ear wax come out of the right.  Past Medical History:  Diagnosis Date  . Cataract Right eye   HAD SURGERY  . Chronic joint pain   . Hx of adenomatous colonic polyps 08/18/2014  . Hypothyroidism   . Multiple nasal polyps   . Osteopenia   . Parkinson disease ([place])   . Parkinson's disease [facility])     Patient Active Problem List   Diagnosis Date Noted  . 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 Memory loss 12/13/2014  . Soft tissue mass 12/13/2014  . Recent skin changes 12/13/2014  . Hx of adenomatous colonic polyps 08/18/2014  . Fungal dermatitis 03/19/2014  . Overweight 03/19/2014  . Cataract   . Constipation, slow transit 09/03/2012  . Seasonal allergic rhinitis 04/18/2012  . General medical examination 05/18/2011  . Elevated BP 03/13/2011  . Vaginal dryness, menopausal 03/13/2011  . Vitamin D deficiency 03/13/2011  . Hypertriglyceridemia 03/13/2011  . OSTEOPENIA 05/31/2010  . Parkinsons disease ([place]) 04/07/2010  . POSTMENOPAUSAL STATUS 04/07/2010  . 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 DRY MOUTH 03/07/2010  .  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 PARESTHESIA 01/26/2009  . Hypothyroidism 10/20/2008  . 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 ABDOMINAL HYSTERECTOMY     ovaries remain  . APPENDECTOMY    . CATARACT EXTRACTION    . TONSILLECTOMY      OB History    No data available       Home Medications    Prior to Admission medications   Medication Sig Start Date End Date Taking? Authorizing Provider  acetaminophen (TYLENOL) 500 MG tablet Take 500-1,000 mg by mouth every 6 (six) hours as needed for pain.     [provider]  Carbidopa-Levodopa ER (SINEMET CR) 25-100 MG tablet controlled release Take 1 tablet by mouth 3 (three) times daily.     [provider]  cholecalciferol (VITAMIN D) 1000 units tablet Take 2,000 Units by mouth daily.    [provider]  Donepezil HCl (ARICEPT PO) Take 25 mg by mouth daily.     [provider]  Doxylamine Succinate, Sleep, (UNISOM PO) Take by mouth.    [provider]  ipratropium (ATROVENT) 0.06 % nasal spray Place into the nose. 03/05/17 04/04/17  [provider]  levothyroxine (SYNTHROID, LEVOTHROID) 75 MCG tablet Take 1 tablet (75 mcg total) by mouth daily. 04/17/17   Midge Minium, MD  QUEtiapine (SEROQUEL) 25 MG tablet Take 25 mg by mouth  at bedtime.    [provider]  rasagiline (AZILECT) 1 MG TABS tablet Take 1 tablet (1 mg total) by mouth daily. Please call [phone] to schedule an appt for continued refills. 02/18/17   Marcial Pacas, MD    Family History History reviewed. No pertinent family history.  Social History Social History  Substance Use Topics  . Smoking status: Former Smoker    Types: Cigarettes    Quit date: 01/01/2001  . Smokeless tobacco: Never Used  . Alcohol use No     Comment: not since being in the facility      Allergies   Carbidopa-levodopa; Codeine; Ether; Gabapentin; and Iodine   Review of  Systems Review of Systems  Constitutional: Negative for fever.  HENT: Positive for ear pain.   Respiratory: Negative for shortness of breath.   Cardiovascular: Negative for chest pain.  Gastrointestinal: Negative for vomiting.  Neurological: Positive for headaches. Negative for weakness and numbness.  All other systems reviewed and are negative.    Physical Exam Updated Vital Signs BP 112/60 (BP Location: Right Arm)   Pulse 82   Temp 98.1 F (36.7 C) (Oral)   Resp 16   SpO2 100%   Physical Exam  Constitutional: She is oriented to person, place, and time. She appears well-developed and well-nourished.  HENT:  Head: Normocephalic and atraumatic.  Right Ear: External ear normal.  Left Ear: External ear normal.  Nose: Nose normal.  Bilateral cerumen impactions Decreased hearing on right when rubbing fingers together, normal on left  Eyes: Right eye exhibits no discharge. Left eye exhibits no discharge.  Neck: Normal range of motion. Neck supple.  Pulmonary/Chest: Effort normal.  Neurological: She is alert and oriented to person, place, and time.  Intermittent tremor c/w parkinson's  Skin: Skin is warm and dry.  Nursing note and vitals reviewed.    ED Treatments / Results  Labs (all labs ordered are listed, but only abnormal results are displayed) Labs Reviewed  COMPREHENSIVE METABOLIC PANEL - Abnormal; Notable for the following:       Result Value   Total Protein 6.1 (*)    AST 14 (*)    ALT <5 (*)    All other components within normal limits  I-STAT CHEM 8, ED - Abnormal; Notable for the following:    Calcium, Ion 1.13 (*)    All other components within normal limits  PROTIME-INR  APTT  CBC  DIFFERENTIAL  I-STAT TROPONIN, ED  CBG MONITORING, ED    EKG  EKG Interpretation  Date/Time:  Monday September 09 2017 15:15:34 EDT Ventricular Rate:  82 PR Interval:  156 QRS Duration: 72 QT Interval:  372 QTC Calculation: 434 R Axis:   83 Text Interpretation:   Normal sinus rhythm with sinus arrhythmia Normal ECG no significant change since July 2018 Confirmed by Sherwood Gambler [phone]) on 09/09/2017 7:43:54 PM       Radiology Ct Head Wo Contrast  Result Date: 09/09/2017 CLINICAL DATA:  76 year old with intermittent hearing loss that began yesterday at noon (approximately 29 hours ago). Patient recently completed antibiotic therapy for urinary tract infection. Current history of Parkinson's disease. EXAM: CT HEAD WITHOUT CONTRAST TECHNIQUE: Contiguous axial images were obtained from the base of the skull through the vertex without intravenous contrast. COMPARISON:  None. FINDINGS: Brain: Head tilt in the gantry accounts for apparent asymmetry in the cerebral hemispheres. Moderate to severe cortical and deep atrophy. Severe changes of small vessel disease of the white matter diffusely. No mass lesion.  No midline shift. No acute hemorrhage or hematoma. No extra-axial fluid collections. No evidence of acute infarction. Vascular: Severe bilateral carotid siphon atherosclerosis. No hyperdense vessel. Skull: No skull fracture or other focal osseous abnormality involving the skull. Sinuses/Orbits: Visualized paranasal sinuses, bilateral mastoid air cells and bilateral middle ear cavities well-aerated. Visualized orbits and globes are normal. Other: Non-occlusive cerumen in both external auditory canals. IMPRESSION: 1. No acute intracranial abnormality. 2. Moderate to severe generalized atrophy and severe chronic microvascular ischemic changes of the white matter. Electronically Signed   By: Evangeline Dakin M.D.   On: 09/09/2017 16:45    Procedures .Ear Cerumen Removal Date/Time: 09/09/2017 8:25 PM Performed by: Sherwood Gambler Authorized by: Sherwood Gambler   Consent:    Consent obtained:  Verbal   Consent given by:  Patient Procedure details:    Location:  R ear and L ear   Procedure type: irrigation   Post-procedure details:    Inspection:  TM intact    Hearing quality:  Improved   Patient tolerance of procedure:  Tolerated with difficulty   (including critical care time)  Medications Ordered in ED Medications - No data to display   Initial Impression / Assessment and Plan / ED Course  I have reviewed the triage vital signs and the nursing notes.  Pertinent labs & imaging results that were available during my care of the patient were reviewed by me and considered in my medical decision making (see chart for details).     After cerumen disimpaction, the patient now has normal hearing again.  The normal hearing started after the right one was disimpacted with irrigation and some manual removal.  No clear source of infection or other abnormality in her ear canal and reevaluation.  She did have some dizziness and nausea with this.  She did want to proceed with the left one which had to be stopped early because she vomited and felt dizzy.  While there is still cerumen in here, I think this can be managed as an outpatient with over-the-counter treatments because she now has full hearing again.  Labs and CT for possible strokelike symptoms had been started in triage by the nurse, but all of her symptoms seem to be localized to the ears and she has no other acute complaints.  She does have a mild on and off headache but it does not seem consistent with an acute CNS pathology.  Labs and CT were reviewed but otherwise I do not think there is an acute CNS process.  Follow-up with PCP.  Final Clinical Impressions(s) / ED Diagnoses   Final diagnoses:  Hearing loss of both ears due to cerumen impaction    New Prescriptions New Prescriptions   No medications on file     Sherwood Gambler, MD 09/09/17 2028

## 2017-09-09 NOTE — Telephone Encounter (Signed)
Pt husband called to states that pt was just discharged from [facility] on 10/26, husband states that pt was tested for uti, however the results were lost and was started on and abx, husband states that pt was not to be on any abx due to some blood counts being to low. Now pt is having hearing loss in both ears and husband is asking if she would need to come her for an appt or should she go to ER.

## 2017-09-10 NOTE — Telephone Encounter (Signed)
This is fine.  Ok to start 10/31

## 2017-09-10 NOTE — Telephone Encounter (Signed)
FYI

## 2017-09-10 NOTE — Telephone Encounter (Signed)
verbal ok given.   

## 2017-09-11 DIAGNOSIS — I10 Essential (primary) hypertension: Secondary | ICD-10-CM | POA: Diagnosis not present

## 2017-09-11 DIAGNOSIS — F0280 Dementia in other diseases classified elsewhere without behavioral disturbance: Secondary | ICD-10-CM | POA: Diagnosis not present

## 2017-09-11 DIAGNOSIS — Z7982 Long term (current) use of aspirin: Secondary | ICD-10-CM | POA: Diagnosis not present

## 2017-09-11 DIAGNOSIS — Z9181 History of falling: Secondary | ICD-10-CM | POA: Diagnosis not present

## 2017-09-11 DIAGNOSIS — M21372 Foot drop, left foot: Secondary | ICD-10-CM | POA: Diagnosis not present

## 2017-09-11 DIAGNOSIS — Z87440 Personal history of urinary (tract) infections: Secondary | ICD-10-CM | POA: Diagnosis not present

## 2017-09-11 DIAGNOSIS — G20 Parkinson's disease: Secondary | ICD-10-CM | POA: Diagnosis not present

## 2017-09-14 ENCOUNTER — Emergency Department (HOSPITAL_COMMUNITY): Payer: Medicare Other

## 2017-09-14 ENCOUNTER — Other Ambulatory Visit: Payer: Self-pay

## 2017-09-14 ENCOUNTER — Encounter (HOSPITAL_COMMUNITY): Payer: Self-pay

## 2017-09-14 ENCOUNTER — Emergency Department (HOSPITAL_COMMUNITY)
Admission: EM | Admit: 2017-09-14 | Discharge: 2017-09-14 | Disposition: A | Discharge status: Home / Self Care | Payer: Medicare Other | Attending: Emergency Medicine | Admitting: Emergency Medicine

## 2017-09-14 DIAGNOSIS — Z79899 Other long term (current) drug therapy: Secondary | ICD-10-CM | POA: Diagnosis not present

## 2017-09-14 DIAGNOSIS — Z87891 Personal history of nicotine dependence: Secondary | ICD-10-CM | POA: Diagnosis not present

## 2017-09-14 DIAGNOSIS — E039 Hypothyroidism, unspecified: Secondary | ICD-10-CM | POA: Insufficient documentation

## 2017-09-14 DIAGNOSIS — R410 Disorientation, unspecified: Secondary | ICD-10-CM | POA: Insufficient documentation

## 2017-09-14 DIAGNOSIS — F0390 Unspecified dementia without behavioral disturbance: Secondary | ICD-10-CM | POA: Insufficient documentation

## 2017-09-14 DIAGNOSIS — M79672 Pain in left foot: Secondary | ICD-10-CM | POA: Diagnosis not present

## 2017-09-14 DIAGNOSIS — G20 Parkinson's disease: Secondary | ICD-10-CM | POA: Insufficient documentation

## 2017-09-14 DIAGNOSIS — R0902 Hypoxemia: Secondary | ICD-10-CM | POA: Diagnosis not present

## 2017-09-14 DIAGNOSIS — R4701 Aphasia: Secondary | ICD-10-CM | POA: Diagnosis not present

## 2017-09-14 DIAGNOSIS — R4182 Altered mental status, unspecified: Secondary | ICD-10-CM | POA: Diagnosis not present

## 2017-09-14 DIAGNOSIS — R531 Weakness: Secondary | ICD-10-CM | POA: Diagnosis not present

## 2017-09-14 LAB — URINALYSIS, ROUTINE W REFLEX MICROSCOPIC
BILIRUBIN URINE: NEGATIVE
Glucose, UA: NEGATIVE mg/dL
Ketones, ur: NEGATIVE mg/dL
LEUKOCYTES UA: NEGATIVE
NITRITE: NEGATIVE
PH: 6 (ref 5.0–8.0)
Protein, ur: NEGATIVE mg/dL
SPECIFIC GRAVITY, URINE: 1.015 (ref 1.005–1.030)

## 2017-09-14 LAB — CBC
HCT: 40.4 % (ref 36.0–46.0)
Hemoglobin: 12.8 g/dL (ref 12.0–15.0)
MCH: 27.8 pg (ref 26.0–34.0)
MCHC: 31.7 g/dL (ref 30.0–36.0)
MCV: 87.8 fL (ref 78.0–100.0)
PLATELETS: 255 10*3/uL (ref 150–400)
RBC: 4.6 MIL/uL (ref 3.87–5.11)
RDW: 15.1 % (ref 11.5–15.5)
WBC: 8.5 10*3/uL (ref 4.0–10.5)

## 2017-09-14 LAB — BASIC METABOLIC PANEL
Anion gap: 8 (ref 5–15)
BUN: 17 mg/dL (ref 6–20)
CO2: 28 mmol/L (ref 22–32)
CREATININE: 0.55 mg/dL (ref 0.44–1.00)
Calcium: 9.7 mg/dL (ref 8.9–10.3)
Chloride: 104 mmol/L (ref 101–111)
GFR calc Af Amer: >60 mL/min (ref >60)
Glucose, Bld: 119 mg/dL — ABNORMAL HIGH (ref 65–99)
Potassium: 3.7 mmol/L (ref 3.5–5.1)
SODIUM: 140 mmol/L (ref 135–145)

## 2017-09-14 LAB — CBG MONITORING, ED: Glucose-Capillary: 104 mg/dL — ABNORMAL HIGH (ref 65–99)

## 2017-09-14 NOTE — ED Notes (Signed)
Patient transported to MRI 

## 2017-09-14 NOTE — ED Provider Notes (Signed)
[facility] DEPT Provider Note   CSN: [id number] Arrival date & time: 09/14/17  1301     History   Chief Complaint No chief complaint on file.   HPI Melissa Douglas is a 76 y.o. female with past medical history of Parkinson disease, dementia, left foot drop, hypothyroidism, presenting to the ED brought in by her husband for an episode of a aphasia that occurred this morning around 8 AM.  Patient's husband states the episode lasted about an hour, it began with the patient being difficult to wake.  He states when she woke up she is unable to speak and was confused.  He states she eventually began speaking 1 word at a time.  During that episode he denies any facial droop, unilateral weakness, or other signs.  Patient states she is feeling slightly confused and worried.  Denies any pain or numbness, headache or vision changes, urinary symptoms, abdominal pain, diarrhea, chest pain, shortness of breath.  She is not on anticoagulation.  Patient with recent in ED visit on 09/09/2017 for acute hearing loss, that was witnessed as serum and impaction and resolved with treatment.  CT scan was done during that visit in triage, and negative.  States she is currently wheelchair-bound and requires significant help with ambulation that is provider by the home aide.  The history is provided by the spouse and the patient.    Past Medical History:  Diagnosis Date  . Cataract Right eye   HAD SURGERY  . Chronic joint pain   . Hx of adenomatous colonic polyps 08/18/2014  . Hypothyroidism   . Multiple nasal polyps   . Osteopenia   . Parkinson disease ([place])   . Parkinson's disease [facility])     Patient Active Problem List   Diagnosis Date Noted  . 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 Memory loss 12/13/2014  . Soft tissue mass 12/13/2014  . Recent skin changes 12/13/2014  . Hx of adenomatous colonic polyps 08/18/2014  . Fungal dermatitis 03/19/2014  . Overweight  03/19/2014  . Cataract   . Constipation, slow transit 09/03/2012  . Seasonal allergic rhinitis 04/18/2012  . General medical examination 05/18/2011  . Elevated BP 03/13/2011  . Vaginal dryness, menopausal 03/13/2011  . Vitamin D deficiency 03/13/2011  . Hypertriglyceridemia 03/13/2011  . OSTEOPENIA 05/31/2010  . Parkinsons disease ([place]) 04/07/2010  . POSTMENOPAUSAL STATUS 04/07/2010  . 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 DRY MOUTH 03/07/2010  . 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 PARESTHESIA 01/26/2009  . Hypothyroidism 10/20/2008  . 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 ABDOMINAL HYSTERECTOMY     ovaries remain  . APPENDECTOMY    . CATARACT EXTRACTION    . TONSILLECTOMY      OB History    No data available       Home Medications    Prior to Admission medications   Medication Sig Start Date End Date Taking? Authorizing Provider  acetaminophen (TYLENOL) 500 MG tablet Take 500-1,000 mg by mouth every 6 (six) hours as needed for pain.    Yes [provider]  carbidopa-levodopa (SINEMET IR) 25-100 MG tablet Take 1 tablet by mouth 3 (three) times daily.  09/09/17  Yes [provider]  cholecalciferol (VITAMIN D) 1000 units tablet Take 2,000 Units by mouth daily.   Yes [provider]  donepezil (ARICEPT) 10 MG tablet Take 25 mg by mouth at bedtime.   Yes [provider]  doxylamine, Sleep, (UNISOM) 25 MG tablet Take 25 mg by mouth at bedtime as needed for sleep.   Yes [provider]  HYDROcodone-acetaminophen (NORCO/VICODIN) 5-325 MG tablet Take 1 tablet by mouth every 6 (six) hours as needed for moderate pain.   Yes [provider]  ipratropium (ATROVENT) 0.06 % nasal spray Place 1 spray into the nose 2 (two) times daily.  09/09/17  Yes [provider]  levothyroxine (SYNTHROID, LEVOTHROID) 75 MCG tablet Take 1 tablet (75 mcg total) by mouth daily. 04/17/17  Yes  Midge Minium, MD  mirtazapine (REMERON) 30 MG tablet Take 1 tablet by mouth at bedtime.  09/09/17  Yes [provider]  rasagiline (AZILECT) 1 MG TABS tablet Take 1 tablet (1 mg total) by mouth daily. Please call [phone] to schedule an appt for continued refills. 02/18/17  Yes Marcial Pacas, MD  nitrofurantoin, macrocrystal-monohydrate, (MACROBID) 100 MG capsule Take 1 capsule by mouth 2 (two) times daily. For 7 days, completed 10/28 09/09/17   [provider]    Family History History reviewed. No pertinent family history.  Social History Social History  Substance Use Topics  . Smoking status: Former Smoker    Types: Cigarettes    Quit date: 01/01/2001  . Smokeless tobacco: Never Used  . Alcohol use No     Comment: not since being in the facility      Allergies   Codeine; Ether; Gabapentin; Iodine; and Iodinated diagnostic agents   Review of Systems Review of Systems  Constitutional: Negative for fever.  Eyes: Negative for visual disturbance.  Respiratory: Negative for shortness of breath.   Cardiovascular: Negative for chest pain.  Gastrointestinal: Negative for abdominal pain, diarrhea and vomiting.  Genitourinary: Negative for dysuria and frequency.  Neurological: Positive for speech difficulty. Negative for facial asymmetry, weakness, numbness and headaches.  Hematological: Does not bruise/bleed easily.  Psychiatric/Behavioral: Positive for confusion.  All other systems reviewed and are negative.    Physical Exam Updated Vital Signs BP 115/63   Pulse 73   Temp 98.1 F (36.7 C) (Oral)   Resp 14   SpO2 98%   Physical Exam  Constitutional: She appears well-developed and well-nourished. No distress.  HENT:  Head: Normocephalic and atraumatic.  Eyes: Pupils are equal, round, and reactive to light. Conjunctivae and EOM are normal.  Neck: Normal range of motion. Neck supple.  Cardiovascular: Normal rate, regular rhythm, normal heart sounds and  intact distal pulses.   Pulmonary/Chest: Effort normal and breath sounds normal. No respiratory distress. She has no wheezes. She has no rales.  Abdominal: Soft. Bowel sounds are normal. She exhibits no distension. There is no tenderness. There is no rebound and no guarding.  Neurological: She is alert.  Mental Status:  Alert, oriented to person and place, thought content appropriate, able to give a coherent history. Speech fluent without evidence of aphasia. Able to follow 2 step commands without difficulty.  Cranial Nerves:  II:  Peripheral visual fields grossly normal, pupils equal, round, reactive to light III,IV, VI: ptosis not present, extra-ocular motions intact bilaterally  V,VII: smile symmetric, facial light touch sensation equal VIII: hearing grossly normal to voice  X: uvula elevates symmetrically  XI: bilateral shoulder shrug symmetric and strong XII: midline tongue extension without fassiculations Motor:  Normal tone. 5/5 in upper and lower extremities bilaterally including strong and equal grip strength and hip flexion (unable to assess plantar/dorsiflexion d/t chronic left foot drop) Sensory: Pinprick and light touch normal in all extremities.  Deep  Tendon Reflexes: 2+ and symmetric in the biceps and patella Cerebellar: normal finger-to-nose with bilateral upper extremities CV: distal pulses palpable throughout    Skin: Skin is warm.  Psychiatric: She has a normal mood and affect. Her behavior is normal.  Nursing note and vitals reviewed.    ED Treatments / Results  Labs (all labs ordered are listed, but only abnormal results are displayed) Labs Reviewed  BASIC METABOLIC PANEL - Abnormal; Notable for the following:       Result Value   Glucose, Bld 119 (*)    All other components within normal limits  URINALYSIS, ROUTINE W REFLEX MICROSCOPIC - Abnormal; Notable for the following:    Hgb urine dipstick SMALL (*)    Bacteria, UA RARE (*)    Squamous Epithelial /  LPF 0-5 (*)    All other components within normal limits  CBG MONITORING, ED - Abnormal; Notable for the following:    Glucose-Capillary 104 (*)    All other components within normal limits  CBC    EKG  EKG Interpretation  Date/Time:  Saturday September 14 2017 13:21:28 EDT Ventricular Rate:  68 PR Interval:    QRS Duration: 88 QT Interval:  406 QTC Calculation: 432 R Axis:   83 Text Interpretation:  Sinus rhythm Borderline right axis deviation Probable LVH with secondary repol abnrm Confirmed by Nat Christen [phone]) on 09/14/2017 3:04:31 PM       Radiology Ct Head Wo Contrast  Result Date: 09/14/2017 CLINICAL DATA:  Patient presented to the ed with c/o alter mental status and generalized weakness started this morning. Patient recently finished Macrobid on Sunday. Pt have hx of dementia and parkinson. EXAM: CT HEAD WITHOUT CONTRAST TECHNIQUE: Contiguous axial images were obtained from the base of the skull through the vertex without intravenous contrast. COMPARISON:  09/09/2017 FINDINGS: Brain: No evidence of acute infarction, hemorrhage, hydrocephalus, extra-axial collection or mass lesion/mass effect. There is ventricular and sulcal enlargement reflecting moderate atrophy. Mild periventricular white matter hypoattenuation is noted consistent with chronic microvascular ischemic change. Vascular: No hyperdense vessel or unexpected calcification. Skull: Normal. Negative for fracture or focal lesion. Sinuses/Orbits: Globes and orbits are unremarkable. Visualized sinuses and mastoid air cells are clear. Other: None. IMPRESSION: 1. No acute intracranial abnormalities. 2. Moderate atrophy and mild chronic microvascular ischemic change stable from the previous study. Electronically Signed   By: Lajean Manes M.D.   On: 09/14/2017 15:07   Mr Brain Wo Contrast (neuro Protocol)  Result Date: 09/14/2017 CLINICAL DATA:  Altered mental status and generalized weakness beginning today. TIAs. EXAM: MRI  HEAD WITHOUT CONTRAST TECHNIQUE: Multiplanar, multiecho pulse sequences of the brain and surrounding structures were obtained without intravenous contrast. COMPARISON:  CT head without contrast from the same day. FINDINGS: Brain: Atrophy and white matter changes are mildly advanced for age. No acute or subacute infarct, hemorrhage, or mass lesion is present. Ventricles are proportionate to the degree of atrophy. The internal auditory canals are within normal limits bilaterally. The brainstem and cerebellum are normal. Vascular: Flow is present in the major intracranial arteries. Skull and upper cervical spine: The skullbase is within normal limits. The craniocervical junction is normal. Midline sagittal structures are unremarkable. Sinuses/Orbits: A polyp or mucous retention cyst is present inferiorly in the left maxillary sinus. The remaining paranasal sinuses and the mastoid air cells are clear. Bilateral lens replacements are present. The globes and orbits are within normal limits. IMPRESSION: 1. No acute or subacute abnormality to explain the patient's symptoms. 2. Atrophy  and white matter disease is mildly advanced for age. This likely reflects the sequela of chronic microvascular ischemia. Electronically Signed   By: San Morelle M.D.   On: 09/14/2017 20:15    Procedures Procedures (including critical care time)  Medications Ordered in ED Medications - No data to display   Initial Impression / Assessment and Plan / ED Course  I have reviewed the triage vital signs and the nursing notes.  Pertinent labs & imaging results that were available during my care of the patient were reviewed by me and considered in my medical decision making (see chart for details).  Clinical Course as of Sep 14 2046  Sat Sep 14, 2017  1554 CT head neg. Concern for TIA. Neuro consult placed.  [JR]    Clinical Course User Index [JR] Robinson, [place] N, PA-C    Pt presenting with episode of aphasia and  confusion that occurred this morning. Patient symptoms had resolved PTA.  She is oriented to person and place, no obvious focal neuro deficits on exam.  Labs reassuring.  CT head negative for acute pathology.  Suspect possible TIA. Spoke with Dr. Rory Percy with Neurology, who recommends MRI. He states if MRI negative, pt is safe for discharge and to follow up outpatient with her neurologist.  He mentions patient's daily medications can be sedating and could be possible cause of episode today.  MRI neg. Discussed results with pt and her husband. Husband states she has an appointment with her neurologist on Friday.  Pt is well-appearing, not in distress, remains neurovascularly intact throughout visit.  She is safe for discharge at this time.  Patient discussed with and seen by Dr. Lacinda Axon.  Discussed results, findings, treatment and follow up. Patient advised of return precautions. Patient verbalized understanding and agreed with plan.  Medical screening examination/treatment/procedure(s) were conducted as a shared visit with non-physician practitioner(s) and myself.  I personally evaluated the patient during the encounter.  Patient presents with transient difficulty with speech and confusion lasting 1 hour.  She is back to baseline now.  Discussed with neurologist on-call.  If MRI of brain was normal, patient could be discharged and followed-up as an outpatient.  EKG Interpretation  Date/Time:  Saturday September 14 2017 13:21:28 EDT Ventricular Rate:  68 PR Interval:    QRS Duration: 88 QT Interval:  406 QTC Calculation: 432 R Axis:   83 Text Interpretation:  Sinus rhythm Borderline right axis deviation Probable LVH with secondary repol abnrm Confirmed by Nat Christen [phone]) on 09/14/2017 3:04:31 PM      Final Clinical Impressions(s) / ED Diagnoses   Final diagnoses:  Aphasia    New Prescriptions New Prescriptions   No medications on file     Robinson, [place] N, PA-C 09/14/17 2047      Nat Christen, MD 09/15/17 1009

## 2017-09-14 NOTE — Discharge Instructions (Signed)
Please follow-up with your neurologist about your visit today. Return to the ER for current symptoms, severe headache, facial droop, one-sided weakness, or new or concerning symptoms.

## 2017-09-14 NOTE — ED Triage Notes (Signed)
Patient presented to the ed with c/o alter mental status and generalized weakness started this morning. Patient recently finished Macrobid on Sunday. Pt have hx of dementia and parkinson.

## 2017-09-14 NOTE — ED Notes (Signed)
Bed: WA21 Expected date: 09/14/17 Expected time: 12:47 PM Means of arrival: Ambulance Comments: EMS - Weakness

## 2017-09-16 ENCOUNTER — Telehealth: Payer: Self-pay | Admitting: General Practice

## 2017-09-16 ENCOUNTER — Telehealth: Payer: Self-pay | Admitting: Family Medicine

## 2017-09-16 ENCOUNTER — Telehealth: Payer: Self-pay | Admitting: *Deleted

## 2017-09-16 DIAGNOSIS — M21372 Foot drop, left foot: Secondary | ICD-10-CM | POA: Diagnosis not present

## 2017-09-16 DIAGNOSIS — I10 Essential (primary) hypertension: Secondary | ICD-10-CM | POA: Diagnosis not present

## 2017-09-16 DIAGNOSIS — Z9181 History of falling: Secondary | ICD-10-CM | POA: Diagnosis not present

## 2017-09-16 DIAGNOSIS — Z7982 Long term (current) use of aspirin: Secondary | ICD-10-CM | POA: Diagnosis not present

## 2017-09-16 DIAGNOSIS — F0280 Dementia in other diseases classified elsewhere without behavioral disturbance: Secondary | ICD-10-CM | POA: Diagnosis not present

## 2017-09-16 DIAGNOSIS — G20 Parkinson's disease: Secondary | ICD-10-CM | POA: Diagnosis not present

## 2017-09-16 NOTE — Telephone Encounter (Signed)
Home health calling to advise that there is a level one interaction with pt's Remeron and her Azilect. Knows she has been on both for awhile but wanted to let you know.

## 2017-09-16 NOTE — Telephone Encounter (Signed)
Melissa Douglas from [facility] called to let us know/confirm that they will be doing OT 2 times weekly for 2 weeks, then 1 time the week of Thanksgiving and then 2 times weekly for 2 additional weeks.      Routed to provider as an [place]

## 2017-09-16 NOTE — Telephone Encounter (Signed)
FYI, this was a team Health Phone Call  [facility] Patient Name: Melissa Douglas Gender: Female DOB: [name] 14, 1941 Age: 76 Y 1 M 7 D Return Phone Number: [phone] (Primary) Address: City/State/Zip: [place] [address] Client [place] Client Site [place] Physician Dimple Nanas - MD Contact Type Call Who Is Calling Patient / Member / Family / Caregiver Call Type Triage / Clinical Caller Name Milayah Krell Relationship To Patient Spouse Return Phone Number [phone] (Primary) Chief Complaint SPEAK - sudden inability to talk or slurred speech Reason for Call Symptomatic / Request for [place] states wife only talking in one word and hand signals Translation No Nurse Assessment Nurse: Julien Girt, RN, Almyra Free Date/Time Eilene Ghazi Time): 09/14/2017 11:49:21 AM Confirm and document reason for call. If symptomatic, describe symptoms. ---Caller states his wife is only talking in one word and hand signals. States she had a hard time waking up this morning. She has Dementia/Parkinsons, and seems to be having a hard time processing. Adds she was seen in the ED 4 days for hearing loss, dx serum back up, they did R/O stroke. Does the patient have any new or worsening symptoms? ---Yes Will a triage be completed? ---Yes Related visit to physician within the last 2 weeks? ---Yes Does the PT have any chronic conditions? (i.e. diabetes, asthma, etc.) ---Yes List chronic conditions. ---Dementia/Parkinsons, Hypothyroid Is this a behavioral health or substance abuse call? ---No Guidelines Guideline Title Affirmed Question Affirmed Notes Nurse Date/Time (Eastern Time) Neurologic Deficit Difficult to awaken or acting confused (e.g., disoriented, slurred speech) Julien Girt, RN, Almyra Free  09/14/2017 11:53:46 AM Disp. Time Eilene Ghazi Time) Disposition Final User 09/14/2017 11:47:31 AM Send to Urgent Queue [place], Cecille Rubin PLEASE NOTE: All timestamps contained within this report are represented as [place] Standard Time. CONFIDENTIALTY NOTICE: This fax transmission is intended only for the addressee. It contains information that is legally privileged, confidential or otherwise protected from use or disclosure. If you are not the intended recipient, you are strictly prohibited from reviewing, disclosing, copying using or disseminating any of this information or taking any action in reliance on or regarding this information. If you have received this fax in error, please notify us immediately by telephone so that we can arrange for its return to [place]. Phone: [phone], Toll-Free: [phone], Fax: [phone] Page: 2 of 2 Call Id: [id number] 09/14/2017 11:57:42 AM Call EMS 911 Now Yes Julien Girt, RN, Dagoberto Reef Disagree/Comply Comply Caller Understands Yes PreDisposition InappropriateToAsk Care Advice Given Per Guideline CALL EMS 911 NOW: Immediate medical attention is needed. You need to hang up and call 911 (or an ambulance). Psychologist, forensic Discretion: I'll call you back in a few minutes to be sure you were able to reach them.) CARE ADVICE given per Neurologic Deficit (Adult) guideline.

## 2017-09-17 ENCOUNTER — Ambulatory Visit
Admission: RE | Admit: 2017-09-17 | Discharge: 2017-09-17 | Disposition: A | Discharge status: Home / Self Care | Payer: Medicare Other | Source: Ambulatory Visit | Attending: Family Medicine | Admitting: Family Medicine

## 2017-09-17 ENCOUNTER — Other Ambulatory Visit: Payer: Self-pay | Admitting: Family Medicine

## 2017-09-17 ENCOUNTER — Encounter: Payer: Self-pay | Admitting: Family Medicine

## 2017-09-17 DIAGNOSIS — R921 Mammographic calcification found on diagnostic imaging of breast: Secondary | ICD-10-CM

## 2017-09-17 DIAGNOSIS — R922 Inconclusive mammogram: Secondary | ICD-10-CM | POA: Diagnosis not present

## 2017-09-17 NOTE — Telephone Encounter (Signed)
Noted! Thank you

## 2017-09-18 ENCOUNTER — Telehealth: Payer: Self-pay | Admitting: Family Medicine

## 2017-09-18 DIAGNOSIS — F0280 Dementia in other diseases classified elsewhere without behavioral disturbance: Secondary | ICD-10-CM | POA: Diagnosis not present

## 2017-09-18 DIAGNOSIS — Z9181 History of falling: Secondary | ICD-10-CM | POA: Diagnosis not present

## 2017-09-18 DIAGNOSIS — I10 Essential (primary) hypertension: Secondary | ICD-10-CM | POA: Diagnosis not present

## 2017-09-18 DIAGNOSIS — Z7982 Long term (current) use of aspirin: Secondary | ICD-10-CM | POA: Diagnosis not present

## 2017-09-18 DIAGNOSIS — G20 Parkinson's disease: Secondary | ICD-10-CM | POA: Diagnosis not present

## 2017-09-18 DIAGNOSIS — R911 Solitary pulmonary nodule: Secondary | ICD-10-CM | POA: Diagnosis not present

## 2017-09-18 DIAGNOSIS — M21372 Foot drop, left foot: Secondary | ICD-10-CM | POA: Diagnosis not present

## 2017-09-18 DIAGNOSIS — R918 Other nonspecific abnormal finding of lung field: Secondary | ICD-10-CM | POA: Diagnosis not present

## 2017-09-18 NOTE — Telephone Encounter (Signed)
Ok for orders? 

## 2017-09-18 NOTE — Telephone Encounter (Signed)
Therapist wants VO to see pt once a week x 6 weeks for dysphagia.

## 2017-09-18 NOTE — Telephone Encounter (Signed)
Ok for verbal orders ?

## 2017-09-18 NOTE — Telephone Encounter (Signed)
Called and verbal ok given.  

## 2017-09-19 DIAGNOSIS — M21372 Foot drop, left foot: Secondary | ICD-10-CM | POA: Diagnosis not present

## 2017-09-19 DIAGNOSIS — Z9181 History of falling: Secondary | ICD-10-CM | POA: Diagnosis not present

## 2017-09-19 DIAGNOSIS — I10 Essential (primary) hypertension: Secondary | ICD-10-CM | POA: Diagnosis not present

## 2017-09-19 DIAGNOSIS — Z7982 Long term (current) use of aspirin: Secondary | ICD-10-CM | POA: Diagnosis not present

## 2017-09-19 DIAGNOSIS — G20 Parkinson's disease: Secondary | ICD-10-CM | POA: Diagnosis not present

## 2017-09-19 DIAGNOSIS — F0280 Dementia in other diseases classified elsewhere without behavioral disturbance: Secondary | ICD-10-CM | POA: Diagnosis not present

## 2017-09-20 DIAGNOSIS — G249 Dystonia, unspecified: Secondary | ICD-10-CM | POA: Diagnosis not present

## 2017-09-20 DIAGNOSIS — R031 Nonspecific low blood-pressure reading: Secondary | ICD-10-CM | POA: Diagnosis not present

## 2017-09-20 DIAGNOSIS — R32 Unspecified urinary incontinence: Secondary | ICD-10-CM | POA: Diagnosis not present

## 2017-09-20 DIAGNOSIS — Z87891 Personal history of nicotine dependence: Secondary | ICD-10-CM | POA: Diagnosis not present

## 2017-09-20 DIAGNOSIS — G20 Parkinson's disease: Secondary | ICD-10-CM | POA: Diagnosis not present

## 2017-09-20 DIAGNOSIS — Z79899 Other long term (current) drug therapy: Secondary | ICD-10-CM | POA: Diagnosis not present

## 2017-09-23 ENCOUNTER — Ambulatory Visit: Payer: Medicare Other | Admitting: Family Medicine

## 2017-09-23 ENCOUNTER — Encounter: Payer: Self-pay | Admitting: Family Medicine

## 2017-09-24 ENCOUNTER — Telehealth: Payer: Self-pay | Admitting: *Deleted

## 2017-09-24 ENCOUNTER — Encounter: Payer: Self-pay | Admitting: General Practice

## 2017-09-24 DIAGNOSIS — F0280 Dementia in other diseases classified elsewhere without behavioral disturbance: Secondary | ICD-10-CM | POA: Diagnosis not present

## 2017-09-24 DIAGNOSIS — Z9181 History of falling: Secondary | ICD-10-CM | POA: Diagnosis not present

## 2017-09-24 DIAGNOSIS — G20 Parkinson's disease: Secondary | ICD-10-CM | POA: Diagnosis not present

## 2017-09-24 DIAGNOSIS — Z7982 Long term (current) use of aspirin: Secondary | ICD-10-CM | POA: Diagnosis not present

## 2017-09-24 DIAGNOSIS — I10 Essential (primary) hypertension: Secondary | ICD-10-CM | POA: Diagnosis not present

## 2017-09-24 DIAGNOSIS — M21372 Foot drop, left foot: Secondary | ICD-10-CM | POA: Diagnosis not present

## 2017-09-24 NOTE — Telephone Encounter (Signed)
Shanon Brow from [facility] calling to get a written order for a Dynamic Splinting brace for patient's left ankle.   He states this needs to be a written and signed order and it needs to be faxed to [phone], Elyn Aquas.

## 2017-09-25 DIAGNOSIS — Z9181 History of falling: Secondary | ICD-10-CM | POA: Diagnosis not present

## 2017-09-25 DIAGNOSIS — G20 Parkinson's disease: Secondary | ICD-10-CM | POA: Diagnosis not present

## 2017-09-25 DIAGNOSIS — F0280 Dementia in other diseases classified elsewhere without behavioral disturbance: Secondary | ICD-10-CM | POA: Diagnosis not present

## 2017-09-25 DIAGNOSIS — Z7982 Long term (current) use of aspirin: Secondary | ICD-10-CM | POA: Diagnosis not present

## 2017-09-25 DIAGNOSIS — I10 Essential (primary) hypertension: Secondary | ICD-10-CM | POA: Diagnosis not present

## 2017-09-25 DIAGNOSIS — M21372 Foot drop, left foot: Secondary | ICD-10-CM | POA: Diagnosis not present

## 2017-09-25 NOTE — Telephone Encounter (Signed)
Please advise? Patient husband had cancelled her appointment on Monday. Since we have not seen patient and she has medicare don't we need a face- to -face for DME's?

## 2017-09-26 DIAGNOSIS — I10 Essential (primary) hypertension: Secondary | ICD-10-CM | POA: Diagnosis not present

## 2017-09-26 DIAGNOSIS — Z9181 History of falling: Secondary | ICD-10-CM | POA: Diagnosis not present

## 2017-09-26 DIAGNOSIS — Z7982 Long term (current) use of aspirin: Secondary | ICD-10-CM | POA: Diagnosis not present

## 2017-09-26 DIAGNOSIS — F0280 Dementia in other diseases classified elsewhere without behavioral disturbance: Secondary | ICD-10-CM | POA: Diagnosis not present

## 2017-09-26 DIAGNOSIS — G20 Parkinson's disease: Secondary | ICD-10-CM | POA: Diagnosis not present

## 2017-09-26 DIAGNOSIS — M21372 Foot drop, left foot: Secondary | ICD-10-CM | POA: Diagnosis not present

## 2017-09-26 NOTE — Addendum Note (Signed)
Addended by: Davis Gourd on: 09/26/2017 01:33 PM   Modules accepted: Orders

## 2017-09-26 NOTE — Telephone Encounter (Signed)
We can attempt to write prescription for pt and fax but I am not sure whether this will be accepted w/o face to face encounter

## 2017-09-26 NOTE — Telephone Encounter (Signed)
DME written and faxed to Home health.

## 2017-09-27 DIAGNOSIS — M21372 Foot drop, left foot: Secondary | ICD-10-CM | POA: Diagnosis not present

## 2017-09-27 DIAGNOSIS — G20 Parkinson's disease: Secondary | ICD-10-CM | POA: Diagnosis not present

## 2017-09-27 DIAGNOSIS — I10 Essential (primary) hypertension: Secondary | ICD-10-CM | POA: Diagnosis not present

## 2017-09-27 DIAGNOSIS — F0280 Dementia in other diseases classified elsewhere without behavioral disturbance: Secondary | ICD-10-CM | POA: Diagnosis not present

## 2017-09-27 DIAGNOSIS — Z7982 Long term (current) use of aspirin: Secondary | ICD-10-CM | POA: Diagnosis not present

## 2017-09-27 DIAGNOSIS — Z9181 History of falling: Secondary | ICD-10-CM | POA: Diagnosis not present

## 2017-09-30 DIAGNOSIS — I10 Essential (primary) hypertension: Secondary | ICD-10-CM | POA: Diagnosis not present

## 2017-09-30 DIAGNOSIS — F0280 Dementia in other diseases classified elsewhere without behavioral disturbance: Secondary | ICD-10-CM | POA: Diagnosis not present

## 2017-09-30 DIAGNOSIS — Z7982 Long term (current) use of aspirin: Secondary | ICD-10-CM | POA: Diagnosis not present

## 2017-09-30 DIAGNOSIS — Z9181 History of falling: Secondary | ICD-10-CM | POA: Diagnosis not present

## 2017-09-30 DIAGNOSIS — M21372 Foot drop, left foot: Secondary | ICD-10-CM | POA: Diagnosis not present

## 2017-09-30 DIAGNOSIS — G20 Parkinson's disease: Secondary | ICD-10-CM | POA: Diagnosis not present

## 2017-10-01 DIAGNOSIS — Z7982 Long term (current) use of aspirin: Secondary | ICD-10-CM | POA: Diagnosis not present

## 2017-10-01 DIAGNOSIS — Z9181 History of falling: Secondary | ICD-10-CM | POA: Diagnosis not present

## 2017-10-01 DIAGNOSIS — I10 Essential (primary) hypertension: Secondary | ICD-10-CM | POA: Diagnosis not present

## 2017-10-01 DIAGNOSIS — G20 Parkinson's disease: Secondary | ICD-10-CM | POA: Diagnosis not present

## 2017-10-01 DIAGNOSIS — F0280 Dementia in other diseases classified elsewhere without behavioral disturbance: Secondary | ICD-10-CM | POA: Diagnosis not present

## 2017-10-01 DIAGNOSIS — M21372 Foot drop, left foot: Secondary | ICD-10-CM | POA: Diagnosis not present

## 2017-10-02 DIAGNOSIS — Z9181 History of falling: Secondary | ICD-10-CM | POA: Diagnosis not present

## 2017-10-02 DIAGNOSIS — G20 Parkinson's disease: Secondary | ICD-10-CM | POA: Diagnosis not present

## 2017-10-02 DIAGNOSIS — F0280 Dementia in other diseases classified elsewhere without behavioral disturbance: Secondary | ICD-10-CM | POA: Diagnosis not present

## 2017-10-02 DIAGNOSIS — I10 Essential (primary) hypertension: Secondary | ICD-10-CM | POA: Diagnosis not present

## 2017-10-02 DIAGNOSIS — M21372 Foot drop, left foot: Secondary | ICD-10-CM | POA: Diagnosis not present

## 2017-10-02 DIAGNOSIS — Z7982 Long term (current) use of aspirin: Secondary | ICD-10-CM | POA: Diagnosis not present

## 2017-10-07 DIAGNOSIS — F0280 Dementia in other diseases classified elsewhere without behavioral disturbance: Secondary | ICD-10-CM | POA: Diagnosis not present

## 2017-10-07 DIAGNOSIS — G20 Parkinson's disease: Secondary | ICD-10-CM | POA: Diagnosis not present

## 2017-10-07 DIAGNOSIS — M21372 Foot drop, left foot: Secondary | ICD-10-CM | POA: Diagnosis not present

## 2017-10-07 DIAGNOSIS — Z9181 History of falling: Secondary | ICD-10-CM | POA: Diagnosis not present

## 2017-10-07 DIAGNOSIS — I10 Essential (primary) hypertension: Secondary | ICD-10-CM | POA: Diagnosis not present

## 2017-10-07 DIAGNOSIS — Z7982 Long term (current) use of aspirin: Secondary | ICD-10-CM | POA: Diagnosis not present

## 2017-10-08 DIAGNOSIS — Z7982 Long term (current) use of aspirin: Secondary | ICD-10-CM | POA: Diagnosis not present

## 2017-10-08 DIAGNOSIS — M21372 Foot drop, left foot: Secondary | ICD-10-CM | POA: Diagnosis not present

## 2017-10-08 DIAGNOSIS — Z9181 History of falling: Secondary | ICD-10-CM | POA: Diagnosis not present

## 2017-10-08 DIAGNOSIS — F0280 Dementia in other diseases classified elsewhere without behavioral disturbance: Secondary | ICD-10-CM | POA: Diagnosis not present

## 2017-10-08 DIAGNOSIS — I10 Essential (primary) hypertension: Secondary | ICD-10-CM | POA: Diagnosis not present

## 2017-10-08 DIAGNOSIS — G20 Parkinson's disease: Secondary | ICD-10-CM | POA: Diagnosis not present

## 2017-10-09 ENCOUNTER — Telehealth: Payer: Self-pay | Admitting: Family Medicine

## 2017-10-09 DIAGNOSIS — Z7982 Long term (current) use of aspirin: Secondary | ICD-10-CM | POA: Diagnosis not present

## 2017-10-09 DIAGNOSIS — G20 Parkinson's disease: Secondary | ICD-10-CM | POA: Diagnosis not present

## 2017-10-09 DIAGNOSIS — I10 Essential (primary) hypertension: Secondary | ICD-10-CM | POA: Diagnosis not present

## 2017-10-09 DIAGNOSIS — M21372 Foot drop, left foot: Secondary | ICD-10-CM | POA: Diagnosis not present

## 2017-10-09 DIAGNOSIS — F0280 Dementia in other diseases classified elsewhere without behavioral disturbance: Secondary | ICD-10-CM | POA: Diagnosis not present

## 2017-10-09 DIAGNOSIS — Z9181 History of falling: Secondary | ICD-10-CM | POA: Diagnosis not present

## 2017-10-09 NOTE — Telephone Encounter (Signed)
Copied from [place] [phone]. Topic: Quick Communication - See Telephone Encounter >> Oct 09, 2017  4:21 PM Robina Ade, Helene Kelp D wrote: CRM for notification. See Telephone encounter for: 10/09/17. Deirdre from [facility] and is requesting a order for 1 week.She is ready for discharge next week. Her number is  [phone] and can leave message that revised order is ok.

## 2017-10-10 DIAGNOSIS — Z7982 Long term (current) use of aspirin: Secondary | ICD-10-CM | POA: Diagnosis not present

## 2017-10-10 DIAGNOSIS — Z9181 History of falling: Secondary | ICD-10-CM | POA: Diagnosis not present

## 2017-10-10 DIAGNOSIS — M21372 Foot drop, left foot: Secondary | ICD-10-CM | POA: Diagnosis not present

## 2017-10-10 DIAGNOSIS — G20 Parkinson's disease: Secondary | ICD-10-CM | POA: Diagnosis not present

## 2017-10-10 DIAGNOSIS — F0280 Dementia in other diseases classified elsewhere without behavioral disturbance: Secondary | ICD-10-CM | POA: Diagnosis not present

## 2017-10-10 DIAGNOSIS — I10 Essential (primary) hypertension: Secondary | ICD-10-CM | POA: Diagnosis not present

## 2017-10-10 NOTE — Telephone Encounter (Signed)
Can you find out what the order is for?

## 2017-10-10 NOTE — Telephone Encounter (Signed)
Called and spoke with both patient husband and Deidre with [employer]. The verbal order is for speech therapy for one more week. This would end next week (not January). Verbal ok was given.

## 2017-10-10 NOTE — Telephone Encounter (Signed)
Ok for verbal order  °

## 2017-10-10 NOTE — Telephone Encounter (Signed)
FYI, ok for verbal?

## 2017-10-10 NOTE — Telephone Encounter (Signed)
Caller name: Dierdre  Relation to pt:  Electrical engineer from [place]  Call back number: [phone]    Reason for call:  Checking on the status of verbal orders due to patient meeting goals earlier then anticipated therefore last visit would be January 5th, please advise

## 2017-10-11 ENCOUNTER — Encounter: Payer: Self-pay | Admitting: Internal Medicine

## 2017-10-11 DIAGNOSIS — Z23 Encounter for immunization: Secondary | ICD-10-CM | POA: Diagnosis not present

## 2017-10-11 DIAGNOSIS — I10 Essential (primary) hypertension: Secondary | ICD-10-CM | POA: Diagnosis not present

## 2017-10-11 DIAGNOSIS — Z9181 History of falling: Secondary | ICD-10-CM | POA: Diagnosis not present

## 2017-10-11 DIAGNOSIS — M21372 Foot drop, left foot: Secondary | ICD-10-CM | POA: Diagnosis not present

## 2017-10-11 DIAGNOSIS — Z7982 Long term (current) use of aspirin: Secondary | ICD-10-CM | POA: Diagnosis not present

## 2017-10-11 DIAGNOSIS — F0280 Dementia in other diseases classified elsewhere without behavioral disturbance: Secondary | ICD-10-CM | POA: Diagnosis not present

## 2017-10-11 DIAGNOSIS — G20 Parkinson's disease: Secondary | ICD-10-CM | POA: Diagnosis not present

## 2017-10-14 DIAGNOSIS — Z9181 History of falling: Secondary | ICD-10-CM | POA: Diagnosis not present

## 2017-10-14 DIAGNOSIS — G20 Parkinson's disease: Secondary | ICD-10-CM | POA: Diagnosis not present

## 2017-10-14 DIAGNOSIS — I10 Essential (primary) hypertension: Secondary | ICD-10-CM | POA: Diagnosis not present

## 2017-10-14 DIAGNOSIS — F0280 Dementia in other diseases classified elsewhere without behavioral disturbance: Secondary | ICD-10-CM | POA: Diagnosis not present

## 2017-10-14 DIAGNOSIS — M21372 Foot drop, left foot: Secondary | ICD-10-CM | POA: Diagnosis not present

## 2017-10-14 DIAGNOSIS — Z7982 Long term (current) use of aspirin: Secondary | ICD-10-CM | POA: Diagnosis not present

## 2017-10-15 DIAGNOSIS — I10 Essential (primary) hypertension: Secondary | ICD-10-CM | POA: Diagnosis not present

## 2017-10-15 DIAGNOSIS — Z9181 History of falling: Secondary | ICD-10-CM | POA: Diagnosis not present

## 2017-10-15 DIAGNOSIS — F0280 Dementia in other diseases classified elsewhere without behavioral disturbance: Secondary | ICD-10-CM | POA: Diagnosis not present

## 2017-10-15 DIAGNOSIS — G20 Parkinson's disease: Secondary | ICD-10-CM | POA: Diagnosis not present

## 2017-10-15 DIAGNOSIS — M21372 Foot drop, left foot: Secondary | ICD-10-CM | POA: Diagnosis not present

## 2017-10-15 DIAGNOSIS — Z7982 Long term (current) use of aspirin: Secondary | ICD-10-CM | POA: Diagnosis not present

## 2017-10-16 ENCOUNTER — Encounter: Payer: Self-pay | Admitting: Internal Medicine

## 2017-10-16 DIAGNOSIS — I10 Essential (primary) hypertension: Secondary | ICD-10-CM | POA: Diagnosis not present

## 2017-10-16 DIAGNOSIS — Z7982 Long term (current) use of aspirin: Secondary | ICD-10-CM | POA: Diagnosis not present

## 2017-10-16 DIAGNOSIS — G20 Parkinson's disease: Secondary | ICD-10-CM | POA: Diagnosis not present

## 2017-10-16 DIAGNOSIS — Z9181 History of falling: Secondary | ICD-10-CM | POA: Diagnosis not present

## 2017-10-16 DIAGNOSIS — M21372 Foot drop, left foot: Secondary | ICD-10-CM | POA: Diagnosis not present

## 2017-10-16 DIAGNOSIS — F0280 Dementia in other diseases classified elsewhere without behavioral disturbance: Secondary | ICD-10-CM | POA: Diagnosis not present

## 2017-10-17 DIAGNOSIS — F0280 Dementia in other diseases classified elsewhere without behavioral disturbance: Secondary | ICD-10-CM | POA: Diagnosis not present

## 2017-10-17 DIAGNOSIS — G20 Parkinson's disease: Secondary | ICD-10-CM | POA: Diagnosis not present

## 2017-10-17 DIAGNOSIS — Z7982 Long term (current) use of aspirin: Secondary | ICD-10-CM | POA: Diagnosis not present

## 2017-10-17 DIAGNOSIS — I10 Essential (primary) hypertension: Secondary | ICD-10-CM | POA: Diagnosis not present

## 2017-10-17 DIAGNOSIS — Z9181 History of falling: Secondary | ICD-10-CM | POA: Diagnosis not present

## 2017-10-17 DIAGNOSIS — M21372 Foot drop, left foot: Secondary | ICD-10-CM | POA: Diagnosis not present

## 2017-10-18 DIAGNOSIS — G20 Parkinson's disease: Secondary | ICD-10-CM | POA: Diagnosis not present

## 2017-10-18 DIAGNOSIS — Z7982 Long term (current) use of aspirin: Secondary | ICD-10-CM | POA: Diagnosis not present

## 2017-10-18 DIAGNOSIS — Z9181 History of falling: Secondary | ICD-10-CM | POA: Diagnosis not present

## 2017-10-18 DIAGNOSIS — M21372 Foot drop, left foot: Secondary | ICD-10-CM | POA: Diagnosis not present

## 2017-10-18 DIAGNOSIS — I10 Essential (primary) hypertension: Secondary | ICD-10-CM | POA: Diagnosis not present

## 2017-10-18 DIAGNOSIS — F0280 Dementia in other diseases classified elsewhere without behavioral disturbance: Secondary | ICD-10-CM | POA: Diagnosis not present

## 2017-10-22 DIAGNOSIS — I10 Essential (primary) hypertension: Secondary | ICD-10-CM | POA: Diagnosis not present

## 2017-10-22 DIAGNOSIS — M21372 Foot drop, left foot: Secondary | ICD-10-CM | POA: Diagnosis not present

## 2017-10-22 DIAGNOSIS — Z7982 Long term (current) use of aspirin: Secondary | ICD-10-CM | POA: Diagnosis not present

## 2017-10-22 DIAGNOSIS — G20 Parkinson's disease: Secondary | ICD-10-CM | POA: Diagnosis not present

## 2017-10-22 DIAGNOSIS — Z9181 History of falling: Secondary | ICD-10-CM | POA: Diagnosis not present

## 2017-10-22 DIAGNOSIS — F0280 Dementia in other diseases classified elsewhere without behavioral disturbance: Secondary | ICD-10-CM | POA: Diagnosis not present

## 2017-10-24 ENCOUNTER — Telehealth: Payer: Self-pay | Admitting: *Deleted

## 2017-10-24 DIAGNOSIS — M21372 Foot drop, left foot: Secondary | ICD-10-CM | POA: Diagnosis not present

## 2017-10-24 DIAGNOSIS — Z7982 Long term (current) use of aspirin: Secondary | ICD-10-CM | POA: Diagnosis not present

## 2017-10-24 DIAGNOSIS — F0280 Dementia in other diseases classified elsewhere without behavioral disturbance: Secondary | ICD-10-CM | POA: Diagnosis not present

## 2017-10-24 DIAGNOSIS — I10 Essential (primary) hypertension: Secondary | ICD-10-CM | POA: Diagnosis not present

## 2017-10-24 DIAGNOSIS — G20 Parkinson's disease: Secondary | ICD-10-CM | POA: Diagnosis not present

## 2017-10-24 DIAGNOSIS — Z9181 History of falling: Secondary | ICD-10-CM | POA: Diagnosis not present

## 2017-10-24 NOTE — Telephone Encounter (Signed)
Verbal okay given to extend.     Copied from [place]. Topic: Inquiry >> Oct 23, 2017  4:04 PM Conception Chancy, NT wrote: Reason for CRM: Shanon Brow from [facility] says her PT orders run out tomorrow 12/13 and is making progress but still a mild assist with transfers. Shanon Brow would like to extend her PT for 2x a week for 2 weeks.  Shanon Brow [phone]

## 2017-10-24 NOTE — Telephone Encounter (Signed)
Ok to extend PT

## 2017-10-29 DIAGNOSIS — G20 Parkinson's disease: Secondary | ICD-10-CM | POA: Diagnosis not present

## 2017-10-29 DIAGNOSIS — M21372 Foot drop, left foot: Secondary | ICD-10-CM | POA: Diagnosis not present

## 2017-10-29 DIAGNOSIS — Z9181 History of falling: Secondary | ICD-10-CM | POA: Diagnosis not present

## 2017-10-29 DIAGNOSIS — I10 Essential (primary) hypertension: Secondary | ICD-10-CM | POA: Diagnosis not present

## 2017-10-29 DIAGNOSIS — Z7982 Long term (current) use of aspirin: Secondary | ICD-10-CM | POA: Diagnosis not present

## 2017-10-29 DIAGNOSIS — F0280 Dementia in other diseases classified elsewhere without behavioral disturbance: Secondary | ICD-10-CM | POA: Diagnosis not present

## 2017-10-31 DIAGNOSIS — M21372 Foot drop, left foot: Secondary | ICD-10-CM | POA: Diagnosis not present

## 2017-10-31 DIAGNOSIS — Z9181 History of falling: Secondary | ICD-10-CM | POA: Diagnosis not present

## 2017-10-31 DIAGNOSIS — Z7982 Long term (current) use of aspirin: Secondary | ICD-10-CM | POA: Diagnosis not present

## 2017-10-31 DIAGNOSIS — G20 Parkinson's disease: Secondary | ICD-10-CM | POA: Diagnosis not present

## 2017-10-31 DIAGNOSIS — I10 Essential (primary) hypertension: Secondary | ICD-10-CM | POA: Diagnosis not present

## 2017-10-31 DIAGNOSIS — F0280 Dementia in other diseases classified elsewhere without behavioral disturbance: Secondary | ICD-10-CM | POA: Diagnosis not present

## 2017-11-04 DIAGNOSIS — Z9181 History of falling: Secondary | ICD-10-CM | POA: Diagnosis not present

## 2017-11-04 DIAGNOSIS — Z7982 Long term (current) use of aspirin: Secondary | ICD-10-CM | POA: Diagnosis not present

## 2017-11-04 DIAGNOSIS — M21372 Foot drop, left foot: Secondary | ICD-10-CM | POA: Diagnosis not present

## 2017-11-04 DIAGNOSIS — I10 Essential (primary) hypertension: Secondary | ICD-10-CM | POA: Diagnosis not present

## 2017-11-04 DIAGNOSIS — G20 Parkinson's disease: Secondary | ICD-10-CM | POA: Diagnosis not present

## 2017-11-04 DIAGNOSIS — F0280 Dementia in other diseases classified elsewhere without behavioral disturbance: Secondary | ICD-10-CM | POA: Diagnosis not present

## 2017-11-06 DIAGNOSIS — M21372 Foot drop, left foot: Secondary | ICD-10-CM | POA: Diagnosis not present

## 2017-11-06 DIAGNOSIS — F0280 Dementia in other diseases classified elsewhere without behavioral disturbance: Secondary | ICD-10-CM | POA: Diagnosis not present

## 2017-11-06 DIAGNOSIS — I10 Essential (primary) hypertension: Secondary | ICD-10-CM | POA: Diagnosis not present

## 2017-11-06 DIAGNOSIS — G20 Parkinson's disease: Secondary | ICD-10-CM | POA: Diagnosis not present

## 2017-11-06 DIAGNOSIS — Z9181 History of falling: Secondary | ICD-10-CM | POA: Diagnosis not present

## 2017-11-06 DIAGNOSIS — Z7982 Long term (current) use of aspirin: Secondary | ICD-10-CM | POA: Diagnosis not present

## 2017-11-14 DIAGNOSIS — G249 Dystonia, unspecified: Secondary | ICD-10-CM | POA: Diagnosis not present

## 2017-11-14 DIAGNOSIS — G248 Other dystonia: Secondary | ICD-10-CM | POA: Diagnosis not present

## 2017-11-14 DIAGNOSIS — M24573 Contracture, unspecified ankle: Secondary | ICD-10-CM | POA: Diagnosis not present

## 2017-11-20 ENCOUNTER — Encounter: Payer: Self-pay | Admitting: Family Medicine

## 2017-11-20 NOTE — Telephone Encounter (Signed)
Spoke with patients husband, [name].  [name] reports patient has a history of hallucinations which are under control and doesn't feel the need for an ER visit. Patient did have one episode of emesis this morning after eating "rich foods". Husband states he will continue to watch her throughout the day and will take pt to the ER if symptoms worsen, otherwise will f/u at [facility] tomorrow (11/21/17).

## 2017-11-20 NOTE — Telephone Encounter (Signed)
LM requesting call back. PCP advising patient be evaluated in ER for hallucinations.

## 2017-11-21 ENCOUNTER — Ambulatory Visit: Payer: Medicare Other | Admitting: Family Medicine

## 2017-11-21 ENCOUNTER — Ambulatory Visit: Payer: Medicare Other

## 2017-11-21 ENCOUNTER — Encounter: Payer: Self-pay | Admitting: Nurse Practitioner

## 2017-11-21 ENCOUNTER — Ambulatory Visit (INDEPENDENT_AMBULATORY_CARE_PROVIDER_SITE_OTHER): Payer: Medicare Other | Admitting: Nurse Practitioner

## 2017-11-21 VITALS — BP 116/60 | HR 70 | Temp 97.7°F | Ht 62.0 in

## 2017-11-21 DIAGNOSIS — L853 Xerosis cutis: Secondary | ICD-10-CM

## 2017-11-21 DIAGNOSIS — R404 Transient alteration of awareness: Secondary | ICD-10-CM | POA: Diagnosis not present

## 2017-11-21 LAB — POCT URINALYSIS DIPSTICK
Bilirubin, UA: NEGATIVE
Blood, UA: NEGATIVE
GLUCOSE UA: NEGATIVE
Ketones, UA: NEGATIVE
LEUKOCYTES UA: NEGATIVE
NITRITE UA: NEGATIVE
PROTEIN UA: NEGATIVE
Spec Grav, UA: >=1.03 — AB (ref 1.010–1.025)
Urobilinogen, UA: 0.2 E.U./dL
pH, UA: 5.5 (ref 5.0–8.0)

## 2017-11-21 LAB — CBC WITH DIFFERENTIAL/PLATELET
Basophils Absolute: 0 10*3/uL (ref 0.0–0.1)
Basophils Relative: 0.5 % (ref 0.0–3.0)
EOS PCT: 2.1 % (ref 0.0–5.0)
Eosinophils Absolute: 0.1 10*3/uL (ref 0.0–0.7)
HEMATOCRIT: 37.3 % (ref 36.0–46.0)
HEMOGLOBIN: 12.1 g/dL (ref 12.0–15.0)
LYMPHS PCT: 27.4 % (ref 12.0–46.0)
Lymphs Abs: 1.8 10*3/uL (ref 0.7–4.0)
MCHC: 32.5 g/dL (ref 30.0–36.0)
MCV: 87.7 fl (ref 78.0–100.0)
MONOS PCT: 7 % (ref 3.0–12.0)
Monocytes Absolute: 0.5 10*3/uL (ref 0.1–1.0)
Neutro Abs: 4.1 10*3/uL (ref 1.4–7.7)
Neutrophils Relative %: 63 % (ref 43.0–77.0)
Platelets: 244 10*3/uL (ref 150.0–400.0)
RBC: 4.25 Mil/uL (ref 3.87–5.11)
RDW: 14.2 % (ref 11.5–15.5)
WBC: 6.5 10*3/uL (ref 4.0–10.5)

## 2017-11-21 NOTE — Progress Notes (Signed)
Subjective:  Patient ID: Melissa Douglas, female    DOB: Dec 21, 1940  Age: 77 y.o. MRN: [id number]  CC: Urinary Tract Infection (hallucination--concern about uti--neurology wants to check for uti first before they change her med. 1 year) and Anal Itching (itching on upper body--1 wk--using lotrimin cream started today--wants to know what could cause the itching)   Rash  This is a new problem. The current episode started in the past 7 days. The problem is unchanged. The affected locations include the back. The rash is characterized by itchiness. She was exposed to nothing. Pertinent negatives include no anorexia, congestion, cough, diarrhea, eye pain, fatigue, fever, joint pain, nail changes, rhinorrhea, shortness of breath, sore throat or vomiting. Past treatments include moisturizer. The treatment provided mild relief.    Accompanied by husband with provides all the information.  He reports Visual hallucination (seeing people at home) and delusion (says things are stolen at home) since 05/2017. Onset while in skilled nursing facility for rehab, has gotten worse in last 27months,  Symptoms were initially intermittent, now occur daily. She denies any pain, fever, upper respiratory symptoms, no change in GU or GI function. No change in appetite. Husband reports adequate oral hydration. Husband is concerned she might have UTI or pneumonia.  Outpatient Medications Prior to Visit  Medication Sig Dispense Refill  . Carbidopa-Levodopa ER (SINEMET CR) 25-100 MG tablet controlled release Take 1 tablet 4 (four) times daily by mouth.  98  . cholecalciferol (VITAMIN D) 1000 units tablet Take 2,000 Units by mouth daily.    . clotrimazole (LOTRIMIN) 1 % cream Apply 1 application topically 2 (two) times daily.    Marland Kitchen donepezil (ARICEPT) 5 MG tablet Take 1 tablet every evening by mouth.  0  . ipratropium (ATROVENT) 0.06 % nasal spray Place into the nose.    . levothyroxine (SYNTHROID, LEVOTHROID) 75 MCG  tablet Take 1 tablet (75 mcg total) by mouth daily. 90 tablet 1  . mirtazapine (REMERON) 30 MG tablet Take 1 tablet (30 mg total) by mouth at bedtime.    . rasagiline (AZILECT) 1 MG TABS tablet Take 1 tablet (1 mg total) by mouth daily. Please call [phone] to schedule an appt for continued refills. (Patient not taking: Reported on 11/21/2017) 90 tablet 0   No facility-administered medications prior to visit.     ROS See HPI  Objective:  BP 116/60   Pulse 70   Temp 97.7 F (36.5 C)   Ht 5\' 2"  (1.575 m)   SpO2 97%   BMI 20.30 kg/m   BP Readings from Last 3 Encounters:  11/21/17 116/60  09/14/17 115/63  09/09/17 134/70    Wt Readings from Last 3 Encounters:  09/14/17 111 lb (50.3 kg)  06/11/17 111 lb (50.3 kg)  04/30/17 128 lb (58.1 kg)    Physical Exam  Constitutional: No distress.  Neck: Normal range of motion. Neck supple.  Cardiovascular: Normal rate and regular rhythm.  Pulmonary/Chest: Effort normal and breath sounds normal.  Abdominal: Soft. Bowel sounds are normal. She exhibits no distension. There is no tenderness.  Musculoskeletal: She exhibits no edema.  Lymphadenopathy:    She has no cervical adenopathy.  Neurological: She is alert.  Oriented to self and husband  Skin: Skin is warm and dry. Rash noted.  Vitals reviewed.   Lab Results  Component Value Date   WBC 6.5 11/21/2017   HGB 12.1 11/21/2017   HCT 37.3 11/21/2017   PLT 244.0 11/21/2017   GLUCOSE 119 (H) 09/14/2017  CHOL 181 02/09/2016   TRIG 109.0 02/09/2016   HDL 50.30 02/09/2016   LDLDIRECT 98.0 04/10/2013   LDLCALC 109 (H) 02/09/2016   ALT <5 (L) 09/09/2017   AST 14 (L) 09/09/2017   NA 140 09/14/2017   K 3.7 09/14/2017   CL 104 09/14/2017   CREATININE 0.55 09/14/2017   BUN 17 09/14/2017   CO2 28 09/14/2017   TSH 1.77 04/16/2017   INR 0.97 09/09/2017    Mm Diag Breast Tomo Uni Right  Result Date: 09/17/2017 CLINICAL DATA:  Patient for short-term follow-up probably benign right  breast calcifications EXAM: 2D DIGITAL DIAGNOSTIC UNILATERAL RIGHT MAMMOGRAM WITH CAD AND ADJUNCT TOMO COMPARISON:  Previous exam(s). ACR Breast Density Category c: The breast tissue is heterogeneously dense, which may obscure small masses. FINDINGS: Magnification CC and true lateral views demonstrate unchanged 3 mm group of coarse calcifications within the slightly lateral right breast middle depth. Note the patient was unable to stand and therefore the CC magnification view was not able to be obtained. Mammographic images were processed with CAD. IMPRESSION: Stable probably benign right breast calcifications. RECOMMENDATION: Bilateral diagnostic mammography with magnification views in 6 months. I have discussed the findings and recommendations with the patient. Results were also provided in writing at the conclusion of the visit. If applicable, a reminder letter will be sent to the patient regarding the next appointment. BI-RADS CATEGORY  3: Probably benign. Electronically Signed   By: Lovey Newcomer M.D.   On: 09/17/2017 15:34    Assessment & Plan:   Delonna was seen today for urinary tract infection and anal itching.  Diagnoses and all orders for this visit:  Transient alteration of awareness -     POCT urinalysis dipstick -     Urine Culture -     CBC w/Diff -     Cancel: DG Chest 2 View -     DG Chest 2 View; Future  Dry skin dermatitis   I have discontinued Keeanna Villafranca. Zambito's rasagiline. I am also having her maintain her cholecalciferol, levothyroxine, Carbidopa-Levodopa ER, donepezil, ipratropium, clotrimazole, and mirtazapine.  No orders of the defined types were placed in this encounter.   Follow-up: No Follow-up on file.  Wilfred Lacy, NP

## 2017-11-21 NOTE — Patient Instructions (Addendum)
You will be contacted with lab and CXR results.  [place] combination of hydrocortisone ointment (1%) and mosturizing cream (cetaphil, aveeno or eucerin) unscented to apply to back and chest twice a day.

## 2017-11-22 ENCOUNTER — Ambulatory Visit (INDEPENDENT_AMBULATORY_CARE_PROVIDER_SITE_OTHER)
Admission: RE | Admit: 2017-11-22 | Discharge: 2017-11-22 | Disposition: A | Discharge status: Home / Self Care | Payer: Medicare Other | Source: Ambulatory Visit | Attending: Nurse Practitioner | Admitting: Nurse Practitioner

## 2017-11-22 DIAGNOSIS — R404 Transient alteration of awareness: Secondary | ICD-10-CM | POA: Diagnosis not present

## 2017-11-22 DIAGNOSIS — R4182 Altered mental status, unspecified: Secondary | ICD-10-CM | POA: Diagnosis not present

## 2017-11-22 LAB — URINE CULTURE
MICRO NUMBER:: 90040578
RESULT: NO GROWTH
SPECIMEN QUALITY: ADEQUATE

## 2017-11-23 ENCOUNTER — Encounter (HOSPITAL_COMMUNITY): Payer: Self-pay | Admitting: Emergency Medicine

## 2017-11-23 ENCOUNTER — Emergency Department (HOSPITAL_COMMUNITY)
Admission: EM | Admit: 2017-11-23 | Discharge: 2017-11-24 | Disposition: A | Discharge status: Home / Self Care | Payer: Medicare Other | Attending: Emergency Medicine | Admitting: Emergency Medicine

## 2017-11-23 ENCOUNTER — Emergency Department (HOSPITAL_COMMUNITY): Payer: Medicare Other

## 2017-11-23 ENCOUNTER — Other Ambulatory Visit: Payer: Self-pay

## 2017-11-23 DIAGNOSIS — Y92003 Bedroom of unspecified non-institutional (private) residence as the place of occurrence of the external cause: Secondary | ICD-10-CM | POA: Insufficient documentation

## 2017-11-23 DIAGNOSIS — S8992XA Unspecified injury of left lower leg, initial encounter: Secondary | ICD-10-CM | POA: Diagnosis not present

## 2017-11-23 DIAGNOSIS — E039 Hypothyroidism, unspecified: Secondary | ICD-10-CM | POA: Diagnosis not present

## 2017-11-23 DIAGNOSIS — Z79899 Other long term (current) drug therapy: Secondary | ICD-10-CM | POA: Insufficient documentation

## 2017-11-23 DIAGNOSIS — S199XXA Unspecified injury of neck, initial encounter: Secondary | ICD-10-CM | POA: Diagnosis not present

## 2017-11-23 DIAGNOSIS — G20 Parkinson's disease: Secondary | ICD-10-CM | POA: Insufficient documentation

## 2017-11-23 DIAGNOSIS — M25519 Pain in unspecified shoulder: Secondary | ICD-10-CM | POA: Diagnosis not present

## 2017-11-23 DIAGNOSIS — Y939 Activity, unspecified: Secondary | ICD-10-CM | POA: Diagnosis not present

## 2017-11-23 DIAGNOSIS — Y998 Other external cause status: Secondary | ICD-10-CM | POA: Insufficient documentation

## 2017-11-23 DIAGNOSIS — S0990XA Unspecified injury of head, initial encounter: Secondary | ICD-10-CM | POA: Diagnosis not present

## 2017-11-23 DIAGNOSIS — T148XXA Other injury of unspecified body region, initial encounter: Secondary | ICD-10-CM | POA: Diagnosis not present

## 2017-11-23 DIAGNOSIS — Z87891 Personal history of nicotine dependence: Secondary | ICD-10-CM | POA: Insufficient documentation

## 2017-11-23 DIAGNOSIS — M79642 Pain in left hand: Secondary | ICD-10-CM | POA: Diagnosis not present

## 2017-11-23 DIAGNOSIS — M25562 Pain in left knee: Secondary | ICD-10-CM | POA: Diagnosis not present

## 2017-11-23 DIAGNOSIS — M25532 Pain in left wrist: Secondary | ICD-10-CM | POA: Diagnosis not present

## 2017-11-23 DIAGNOSIS — S6992XA Unspecified injury of left wrist, hand and finger(s), initial encounter: Secondary | ICD-10-CM | POA: Diagnosis not present

## 2017-11-23 DIAGNOSIS — W19XXXA Unspecified fall, initial encounter: Secondary | ICD-10-CM | POA: Diagnosis not present

## 2017-11-23 NOTE — ED Notes (Signed)
ED Provider at bedside. 

## 2017-11-23 NOTE — ED Notes (Signed)
Bed: BN12 Expected date:  Expected time:  Means of arrival:  Comments: EMS 77 yo female unwitnessed fall/dementia-left shoulder and arm pain with no deformity

## 2017-11-23 NOTE — ED Provider Notes (Signed)
TIME SEEN: 11:45 PM  CHIEF COMPLAINT: Fall  HPI: Patient is a 77 year old female with history of Parkinson's disease who lives at home with her family who presents today with a fall.  Was found lying next to her bed.  She is unsure how she got out of her bed.  Family member states that she has been hallucinating for the past several weeks but has had full workup with her primary care physician including labs, chest x-ray and urine.  Declines having further testing here.  States that he thinks that she needs her medications adjusted and they have been talking to the primary care doctor about this.  Family member states the patient was able to get up and walk after she was found on the floor.  She was able to call for help.  She is at her neurologic baseline.  ROS: See HPI Constitutional: no fever  Eyes: no drainage  ENT: no runny nose   Cardiovascular:  no chest pain  Resp: no SOB  GI: no vomiting GU: no dysuria Integumentary: no rash  Allergy: no hives  Musculoskeletal: no leg swelling  Neurological: no slurred speech ROS otherwise negative  PAST MEDICAL HISTORY/PAST SURGICAL HISTORY:  Past Medical History:  Diagnosis Date  . Cataract Right eye   HAD SURGERY  . Chronic joint pain   . Hx of adenomatous colonic polyps 08/18/2014  . Hypothyroidism   . Multiple nasal polyps   . Osteopenia   . Parkinson disease ([place])     MEDICATIONS:  Prior to Admission medications   Medication Sig Start Date End Date Taking? Authorizing Provider  Carbidopa-Levodopa ER (SINEMET CR) 25-100 MG tablet controlled release Take 1 tablet 4 (four) times daily by mouth. 09/02/17   [provider]  cholecalciferol (VITAMIN D) 1000 units tablet Take 2,000 Units by mouth daily.    [provider]  clotrimazole (LOTRIMIN) 1 % cream Apply 1 application topically 2 (two) times daily.    [provider]  donepezil (ARICEPT) 5 MG tablet Take 1 tablet every evening by mouth. 09/20/17    [provider]  ipratropium (ATROVENT) 0.06 % nasal spray Place into the nose. 03/05/17   [provider]  levothyroxine (SYNTHROID, LEVOTHROID) 75 MCG tablet Take 1 tablet (75 mcg total) by mouth daily. 04/17/17   Midge Minium, MD  mirtazapine (REMERON) 30 MG tablet Take 1 tablet (30 mg total) by mouth at bedtime. 11/21/17   Nche, Charlene Brooke, NP    ALLERGIES:  Allergies  Allergen Reactions  . Codeine Nausea And Vomiting    REACTION: sick  . Ether Other (See Comments)    VERY DIZZY  . Gabapentin Nausea And Vomiting and Other (See Comments)    REACTION: hallucinations, dizziness and vomiting  . Iodine Hives    HIVES  . Iodinated Diagnostic Agents Rash    SOCIAL HISTORY:  Social History   Tobacco Use  . Smoking status: Former Smoker    Types: Cigarettes    Last attempt to quit: 01/01/2001    Years since quitting: 16.9  . Smokeless tobacco: Never Used  Substance Use Topics  . Alcohol use: No    Alcohol/week: 7.2 - 8.4 oz    Types: 12 - 14 Glasses of wine per week    Comment: not since being in the facility     FAMILY HISTORY: History reviewed. No pertinent family history.  EXAM: BP (!) 138/49 (BP Location: Left Arm)   Pulse 76   Temp 98 F (36.7  C) (Oral)   Resp 20   SpO2 100%  CONSTITUTIONAL: Alert and oriented and responds appropriately to questions. Well-appearing; well-nourished; GCS 36, elderly HEAD: Normocephalic; atraumatic EYES: Conjunctivae clear, PERRL, EOMI ENT: normal nose; no rhinorrhea; moist mucous membranes; pharynx without lesions noted; no dental injury; no septal hematoma NECK: Supple, no meningismus, no LAD; no midline spinal tenderness, step-off or deformity; trachea midline CARD: RRR; S1 and S2 appreciated; no murmurs, no clicks, no rubs, no gallops RESP: Normal chest excursion without splinting or tachypnea; breath sounds clear and equal bilaterally; no wheezes, no rhonchi, no rales; no hypoxia or respiratory  distress CHEST:  chest wall stable, no crepitus or ecchymosis or deformity, nontender to palpation; no flail chest ABD/GI: Normal bowel sounds; non-distended; soft, non-tender, no rebound, no guarding; no ecchymosis or other lesions noted PELVIS:  stable, nontender to palpation BACK:  The back appears normal and is non-tender to palpation, there is no CVA tenderness; no midline spinal tenderness, step-off or deformity EXT: Normal ROM in all joints; non-tender to palpation; no edema; normal capillary refill; no cyanosis, no bony deformity of patient's extremities, no joint effusion, compartments are soft, extremities are warm and well-perfused, no ecchymosis, tender to palpation over the left dorsal hand, left dorsal wrist but not over the scaphoid, also tender over the left knee SKIN: Normal color for age and race; warm NEURO: Moves all extremities equally, normal sensation diffusely, chronic weakness of the left lower extremity which is unchanged, normal speech, cranial nerves II through XII intact PSYCH: The patient's mood and manner are appropriate. Grooming and personal hygiene are appropriate.  MEDICAL DECISION MAKING: Patient here after she possibly rolled out of bed.  CT of her head, cervical spine and x-rays of the left hand, wrist and knee show no acute abnormality.  Patient has declined pain medication.  Her family reports that she has been hallucinating more than normal over the past but they have had full extensive workup as an outpatient and he declines further workup for this today.  He feels comfortable with plan to take her home.  States that he has Tylenol as well as Norco at home to give as needed for pain control.  Discussed head injury return precautions.  No other sign of trauma on exam.  Hemodynamically stable, afebrile and nontoxic.  Appears well-hydrated.  At this time, I do not feel there is any life-threatening condition present. I have reviewed and discussed all results (EKG,  imaging, lab, urine as appropriate) and exam findings with patient/family. I have reviewed nursing notes and appropriate previous records.  I feel the patient is safe to be discharged home without further emergent workup and can continue workup as an outpatient as needed. Discussed usual and customary return precautions. Patient/family verbalize understanding and are comfortable with this plan.  Outpatient follow-up has been provided if needed. All questions have been answered.      Ward, Delice Bison, DO 11/24/17 [phone]

## 2017-11-23 NOTE — ED Triage Notes (Signed)
Per EMS hx dementia/parkinson's and no use of left leg so she falls a lot. In between two beds. Possible LOC, no visible head trauma. Left shoulder, elbow and rib pain with no obvious deformity. No anticoags.

## 2017-11-24 DIAGNOSIS — M79642 Pain in left hand: Secondary | ICD-10-CM | POA: Diagnosis not present

## 2017-11-24 DIAGNOSIS — S0990XA Unspecified injury of head, initial encounter: Secondary | ICD-10-CM | POA: Diagnosis not present

## 2017-11-24 DIAGNOSIS — S8992XA Unspecified injury of left lower leg, initial encounter: Secondary | ICD-10-CM | POA: Diagnosis not present

## 2017-11-24 DIAGNOSIS — M25532 Pain in left wrist: Secondary | ICD-10-CM | POA: Diagnosis not present

## 2017-11-24 DIAGNOSIS — S199XXA Unspecified injury of neck, initial encounter: Secondary | ICD-10-CM | POA: Diagnosis not present

## 2017-11-24 DIAGNOSIS — S6992XA Unspecified injury of left wrist, hand and finger(s), initial encounter: Secondary | ICD-10-CM | POA: Diagnosis not present

## 2017-11-24 DIAGNOSIS — M25562 Pain in left knee: Secondary | ICD-10-CM | POA: Diagnosis not present

## 2017-11-24 NOTE — Discharge Instructions (Signed)
You may take Tylenol 1000 mg every 6 hours as needed for pain. °

## 2017-11-29 DIAGNOSIS — I493 Ventricular premature depolarization: Secondary | ICD-10-CM | POA: Diagnosis not present

## 2017-11-29 DIAGNOSIS — I491 Atrial premature depolarization: Secondary | ICD-10-CM | POA: Diagnosis not present

## 2017-11-29 DIAGNOSIS — Z5181 Encounter for therapeutic drug level monitoring: Secondary | ICD-10-CM | POA: Diagnosis not present

## 2018-01-26 NOTE — Progress Notes (Signed)
[facility] [address], [place] [address] Phone: [phone] Subjective:    I'm seeing this patient by the request  of:    CC: Right shoulder pain  [id number]  Melissa Douglas is a 77 y.o. female coming in with complaint of right shoulder pain.  Seen previously for [facility] arthritis of the left side greater than 9 months ago.  Patient states that she is having right shoulder pain. No new fall. She is using a wheel chair now and must be transferred in and out of bed which has placed pressure on her shoulders. Patient does have intermittent tingling in bilateral hands.  Patient has end-stage Parkinson's disease.    Past Medical History:  Diagnosis Date  . Cataract Right eye   HAD SURGERY  . Chronic joint pain   . Hx of adenomatous colonic polyps 08/18/2014  . Hypothyroidism   . Multiple nasal polyps   . Osteopenia   . Parkinson disease [facility])    Past Surgical History:  Procedure Laterality Date  . ABDOMINAL HYSTERECTOMY     ovaries remain  . APPENDECTOMY    . CATARACT EXTRACTION    . TONSILLECTOMY     Social History   Socioeconomic History  . Marital status: Married    Spouse name: Melissa Douglas  . Number of children: 3  . Years of education: College  . Highest education level: None  Social Needs  . Financial resource strain: None  . Food insecurity - worry: None  . Food insecurity - inability: None  . Transportation needs - medical: None  . Transportation needs - non-medical: None  Occupational History  . Occupation: Retired  Tobacco Use  . Smoking status: Former Smoker    Types: Cigarettes    Last attempt to quit: 01/01/2001    Years since quitting: 17.0  . Smokeless tobacco: Never Used  Substance and Sexual Activity  . Alcohol use: No    Alcohol/week: 7.2 - 8.4 oz    Types: 12 - 14 Glasses of wine per week    Comment: not since being in the facility   . Drug use: No  . Sexual activity: None  Other Topics Concern  . 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    Caffeine- occasional use   Left-handed.   Allergies  Allergen Reactions  . Codeine Nausea And Vomiting    REACTION: sick  . Ether Other (See Comments)    VERY DIZZY  . Gabapentin Nausea And Vomiting and Other (See Comments)    REACTION: hallucinations, dizziness and vomiting  . Iodine Hives    HIVES  . Iodinated Diagnostic Agents Rash   No family history on file.   Past medical history, social, surgical and family history all reviewed in electronic medical record.  No pertanent information unless stated regarding to the chief complaint.   Review of Systems:Review of systems updated and as accurate as of 01/27/18  No headache, visual changes, nausea, vomiting, diarrhea, constipation, dizziness, abdominal pain, skin rash, fevers, chills, night sweats, weight loss, swollen lymph nodes, body aches, chest pain, shortness of breath, mood changes.  Positive muscle aches and joint swelling  Objective  Blood pressure (!) 132/58, pulse 72, height 5\' 2"  (1.575 m), SpO2 98 %. Systems examined below as of 01/27/18   General: No apparent distress alert masked feces mood and affect normal, dressed appropriately.  HEENT: Pupils equal, extraocular  movements intact  Respiratory: Patient's speak in full sentences and does not appear short of breath  Cardiovascular: Trace lower extremity edema, non tender, no erythema  Skin: Warm dry intact with no signs of infection or rash on extremities or on axial skeleton.  Senile bruising noted Abdomen: Soft nontender  Neuro: Cranial nerves II through XII are intact, neurovascularly intact in all extremities  Lymph: No lymphadenopathy of posterior or anterior cervical chain or axillae bilaterally.  Gait patient is nonweightbearing MSK: Significant atrophy of the musculature.  Patient does have a resting tremor of the right upper extremity.  Mild cogwheeling of  multiple joints Shoulder exams bilaterally show severe tenderness and swelling over the acromioclavicular joints.  Mild impingement bilaterally.  Significant tightness with range of motion testing with cogwheeling noted.  Procedure: Real-time Ultrasound Guided Injection of right acromioclavicular joint Device: GE Logiq Q7 Ultrasound guided injection is preferred based studies that show increased duration, increased effect, greater accuracy, decreased procedural pain, increased response rate, and decreased cost with ultrasound guided versus blind injection.  Verbal informed consent obtained.  Time-out conducted.  Noted no overlying erythema, induration, or other signs of local infection.  Skin prepped in a sterile fashion.  Local anesthesia: Topical Ethyl chloride.  With sterile technique and under real time ultrasound guidance: With a 25-gauge half inch needle was injected with 0.5 cc of 0.5% Marcaine and 0.5 cc of Kenalog 40 mg/mL Completed without difficulty  Pain immediately resolved suggesting accurate placement of the medication.  Advised to call if fevers/chills, erythema, induration, drainage, or persistent bleeding.  Images permanently stored and available for review in the ultrasound unit.  Impression: Technically successful ultrasound guided injection.  Procedure: Real-time Ultrasound Guided Injection of left acromioclavicular joint Device: GE Logiq Q7 Ultrasound guided injection is preferred based studies that show increased duration, increased effect, greater accuracy, decreased procedural pain, increased response rate, and decreased cost with ultrasound guided versus blind injection.  Verbal informed consent obtained.  Time-out conducted.  Noted no overlying erythema, induration, or other signs of local infection.  Skin prepped in a sterile fashion.  Local anesthesia: Topical Ethyl chloride.  With sterile technique and under real time ultrasound guidance: With a 25-gauge half  inch needle patient was injected with 0.5 cc of Kenalog 40 mg/mL into the acromioclavicular joint Completed without difficulty  Pain immediately resolved suggesting accurate placement of the medication.  Advised to call if fevers/chills, erythema, induration, drainage, or persistent bleeding.  Images permanently stored and available for review in the ultrasound unit.  Impression: Technically successful ultrasound guided injection.   Impression and Recommendations:     This case required medical decision making of moderate complexity.      Note: This dictation was prepared with Dragon dictation along with smaller phrase technology. Any transcriptional errors that result from this process are unintentional.

## 2018-01-27 ENCOUNTER — Encounter: Payer: Self-pay | Admitting: Family Medicine

## 2018-01-27 ENCOUNTER — Ambulatory Visit: Payer: Self-pay

## 2018-01-27 ENCOUNTER — Ambulatory Visit (INDEPENDENT_AMBULATORY_CARE_PROVIDER_SITE_OTHER): Payer: Medicare Other | Admitting: Family Medicine

## 2018-01-27 VITALS — BP 132/58 | HR 72 | Ht 62.0 in

## 2018-01-27 DIAGNOSIS — M19011 Primary osteoarthritis, right shoulder: Secondary | ICD-10-CM | POA: Diagnosis not present

## 2018-01-27 DIAGNOSIS — M19012 Primary osteoarthritis, left shoulder: Secondary | ICD-10-CM | POA: Diagnosis not present

## 2018-01-27 DIAGNOSIS — G8929 Other chronic pain: Secondary | ICD-10-CM

## 2018-01-27 DIAGNOSIS — M25511 Pain in right shoulder: Secondary | ICD-10-CM | POA: Diagnosis not present

## 2018-01-27 NOTE — Patient Instructions (Signed)
Good to see you  Melissa Douglas is your friend. Ice 20 minutes 2 times daily. Usually after activity and before bed. Injected the AC joints  Can repeat every 10 weeks so make an appointment if needed for then

## 2018-01-27 NOTE — Assessment & Plan Note (Signed)
Bilateral injections given today.  Patient is in the stages of end-stage Parkinson's.  Is having help with transferring at all times and daily activities.  Hopefully the injections will help with some of the discomfort and pain.  Discussed icing regimen and home exercises.  Topical anti-inflammatories.  Can repeat injections every 10 weeks if necessary.  Not a candidate for physical therapy.

## 2018-01-31 DIAGNOSIS — R918 Other nonspecific abnormal finding of lung field: Secondary | ICD-10-CM | POA: Diagnosis not present

## 2018-01-31 DIAGNOSIS — R911 Solitary pulmonary nodule: Secondary | ICD-10-CM | POA: Diagnosis not present

## 2018-01-31 DIAGNOSIS — I700 Atherosclerosis of aorta: Secondary | ICD-10-CM | POA: Diagnosis not present

## 2018-03-06 ENCOUNTER — Telehealth: Payer: Self-pay | Admitting: Family Medicine

## 2018-03-06 NOTE — Telephone Encounter (Signed)
Spoke with Melissa Douglas he stated that [name] is not interested in scheduling an Annual Wellness Visit at this time. SF

## 2018-03-07 DIAGNOSIS — G20 Parkinson's disease: Secondary | ICD-10-CM | POA: Diagnosis not present

## 2018-03-07 DIAGNOSIS — Z8673 Personal history of transient ischemic attack (TIA), and cerebral infarction without residual deficits: Secondary | ICD-10-CM | POA: Diagnosis not present

## 2018-03-07 DIAGNOSIS — G249 Dystonia, unspecified: Secondary | ICD-10-CM | POA: Diagnosis not present

## 2018-03-07 DIAGNOSIS — G248 Other dystonia: Secondary | ICD-10-CM | POA: Diagnosis not present

## 2018-03-07 DIAGNOSIS — F0390 Unspecified dementia without behavioral disturbance: Secondary | ICD-10-CM | POA: Diagnosis not present

## 2018-04-08 ENCOUNTER — Ambulatory Visit: Payer: Medicare Other | Admitting: Family Medicine

## 2018-04-15 DIAGNOSIS — Z79899 Other long term (current) drug therapy: Secondary | ICD-10-CM | POA: Diagnosis not present

## 2018-04-15 DIAGNOSIS — G20 Parkinson's disease: Secondary | ICD-10-CM | POA: Diagnosis not present

## 2018-04-15 DIAGNOSIS — R4789 Other speech disturbances: Secondary | ICD-10-CM | POA: Diagnosis not present

## 2018-04-15 DIAGNOSIS — K117 Disturbances of salivary secretion: Secondary | ICD-10-CM | POA: Diagnosis not present

## 2018-04-15 DIAGNOSIS — G35 Multiple sclerosis: Secondary | ICD-10-CM | POA: Diagnosis not present

## 2018-04-15 DIAGNOSIS — R2689 Other abnormalities of gait and mobility: Secondary | ICD-10-CM | POA: Diagnosis not present

## 2018-04-16 ENCOUNTER — Other Ambulatory Visit: Payer: Self-pay

## 2018-04-16 ENCOUNTER — Ambulatory Visit (INDEPENDENT_AMBULATORY_CARE_PROVIDER_SITE_OTHER): Payer: Medicare Other | Admitting: Family Medicine

## 2018-04-16 ENCOUNTER — Encounter: Payer: Self-pay | Admitting: Family Medicine

## 2018-04-16 VITALS — BP 126/60 | HR 76 | Temp 97.9°F | Resp 16

## 2018-04-16 DIAGNOSIS — R1110 Vomiting, unspecified: Secondary | ICD-10-CM

## 2018-04-16 DIAGNOSIS — R197 Diarrhea, unspecified: Secondary | ICD-10-CM | POA: Diagnosis not present

## 2018-04-16 DIAGNOSIS — G20 Parkinson's disease: Secondary | ICD-10-CM

## 2018-04-16 NOTE — Progress Notes (Signed)
   Subjective:    Patient ID: Melissa Douglas, female    DOB: 11/05/1941, 77 y.o.   MRN: [id number]  HPI Diarrhea- sxs started 5 days ago after eating prunes.  Stools were soft until last night when it was watery.  She also vomited last evening.  Did not each lunch yesterday.  Very soft stools this AM.  Color is back to normal.  No abdominal pain.  No fevers.  No known sick contacts.  No vomiting today- able to eat tuna on toast, 2 poached eggs   Review of Systems For ROS see HPI     Objective:   Physical Exam  Constitutional: She is oriented to person, place, and time. She appears well-developed and well-nourished. No distress.  HENT:  Head: Normocephalic and atraumatic.  MMM  Neck: Neck supple.  Cardiovascular: Normal rate, regular rhythm and intact distal pulses.  Pulmonary/Chest: Effort normal and breath sounds normal. No respiratory distress. She has no wheezes. She has no rales.  Abdominal: Soft. Bowel sounds are normal. She exhibits no distension. There is no tenderness. There is no rebound.  Lymphadenopathy:    She has no cervical adenopathy.  Neurological: She is alert and oriented to person, place, and time.  Skin: Skin is warm and dry.  Vitals reviewed.         Assessment & Plan:  Vomiting/diarrhea- pt's initial soft stools seem to be caused by her prune intake from Friday- Monday but yesterday (Tuesday) stools were watery and she began vomiting.  Today, no vomiting and pt was able to eat tuna.  Stools again soft but not watery.  No fevers, vitals stable.  Check electrolytes, blood count, LFTs, amylase/lipase.  Reassurance provided and no changes unless labs reflect something different.  Husband expressed understanding.

## 2018-04-16 NOTE — Assessment & Plan Note (Signed)
Following at [facility].  This makes pt a poor historian but family and caregiver are quite helpful

## 2018-04-16 NOTE — Addendum Note (Signed)
Addended by: Glenetta Borg on: 04/16/2018 04:56 PM   Modules accepted: Orders

## 2018-04-16 NOTE — Patient Instructions (Signed)
Follow up as needed or as scheduled We'll notify you of your lab results and make any changes if needed Drink plenty of fluids Try and eat a bland, easy diet- rice, toast, bananas, applesauce.  Avoid dairy until feeling better Call with any questions or concerns Hang in there!!!

## 2018-04-17 ENCOUNTER — Other Ambulatory Visit (INDEPENDENT_AMBULATORY_CARE_PROVIDER_SITE_OTHER): Payer: Medicare Other

## 2018-04-17 DIAGNOSIS — R197 Diarrhea, unspecified: Secondary | ICD-10-CM

## 2018-04-17 LAB — HEPATIC FUNCTION PANEL
ALK PHOS: 49 U/L (ref 39–117)
ALT: 7 U/L (ref 0–35)
AST: 13 U/L (ref 0–37)
Albumin: 4.3 g/dL (ref 3.5–5.2)
BILIRUBIN DIRECT: 0.1 mg/dL (ref 0.0–0.3)
BILIRUBIN TOTAL: 0.4 mg/dL (ref 0.2–1.2)
TOTAL PROTEIN: 6.6 g/dL (ref 6.0–8.3)

## 2018-04-17 LAB — BASIC METABOLIC PANEL
BUN: 21 mg/dL (ref 6–23)
CO2: 26 meq/L (ref 19–32)
Calcium: 10.1 mg/dL (ref 8.4–10.5)
Chloride: 105 mEq/L (ref 96–112)
Creatinine, Ser: 0.63 mg/dL (ref 0.40–1.20)
GFR: 97.22 mL/min (ref >60.00)
GLUCOSE: 127 mg/dL — AB (ref 70–99)
POTASSIUM: 3.6 meq/L (ref 3.5–5.1)
SODIUM: 142 meq/L (ref 135–145)

## 2018-04-17 LAB — CBC WITH DIFFERENTIAL/PLATELET
BASOS PCT: 0.6 % (ref 0.0–3.0)
Basophils Absolute: 0 10*3/uL (ref 0.0–0.1)
EOS ABS: 0.1 10*3/uL (ref 0.0–0.7)
Eosinophils Relative: 2.2 % (ref 0.0–5.0)
HEMATOCRIT: 38.4 % (ref 36.0–46.0)
HEMOGLOBIN: 13.1 g/dL (ref 12.0–15.0)
LYMPHS PCT: 21.2 % (ref 12.0–46.0)
Lymphs Abs: 1.5 10*3/uL (ref 0.7–4.0)
MCHC: 34 g/dL (ref 30.0–36.0)
MCV: 86.1 fl (ref 78.0–100.0)
Monocytes Absolute: 0.5 10*3/uL (ref 0.1–1.0)
Monocytes Relative: 6.9 % (ref 3.0–12.0)
NEUTROS ABS: 4.8 10*3/uL (ref 1.4–7.7)
Neutrophils Relative %: 69.1 % (ref 43.0–77.0)
PLATELETS: 249 10*3/uL (ref 150.0–400.0)
RBC: 4.46 Mil/uL (ref 3.87–5.11)
RDW: 13.6 % (ref 11.5–15.5)
WBC: 7 10*3/uL (ref 4.0–10.5)

## 2018-04-17 LAB — AMYLASE: Amylase: 90 U/L (ref 27–131)

## 2018-04-17 LAB — LIPASE: Lipase: 34 U/L (ref 11.0–59.0)

## 2018-04-17 NOTE — Addendum Note (Signed)
Addended by: Katina Dung on: 04/17/2018 09:19 AM   Modules accepted: Orders

## 2018-04-28 ENCOUNTER — Emergency Department (HOSPITAL_COMMUNITY): Payer: Medicare Other

## 2018-04-28 ENCOUNTER — Encounter (HOSPITAL_COMMUNITY): Payer: Self-pay

## 2018-04-28 ENCOUNTER — Emergency Department (HOSPITAL_COMMUNITY)
Admission: EM | Admit: 2018-04-28 | Discharge: 2018-04-28 | Disposition: A | Discharge status: Home / Self Care | Payer: Medicare Other | Attending: Emergency Medicine | Admitting: Emergency Medicine

## 2018-04-28 DIAGNOSIS — G20 Parkinson's disease: Secondary | ICD-10-CM | POA: Insufficient documentation

## 2018-04-28 DIAGNOSIS — R0902 Hypoxemia: Secondary | ICD-10-CM | POA: Diagnosis not present

## 2018-04-28 DIAGNOSIS — Z79899 Other long term (current) drug therapy: Secondary | ICD-10-CM | POA: Diagnosis not present

## 2018-04-28 DIAGNOSIS — R400 Somnolence: Secondary | ICD-10-CM | POA: Diagnosis not present

## 2018-04-28 DIAGNOSIS — R410 Disorientation, unspecified: Secondary | ICD-10-CM | POA: Diagnosis not present

## 2018-04-28 DIAGNOSIS — Z87891 Personal history of nicotine dependence: Secondary | ICD-10-CM | POA: Insufficient documentation

## 2018-04-28 DIAGNOSIS — R4182 Altered mental status, unspecified: Secondary | ICD-10-CM | POA: Diagnosis not present

## 2018-04-28 DIAGNOSIS — E039 Hypothyroidism, unspecified: Secondary | ICD-10-CM | POA: Insufficient documentation

## 2018-04-28 DIAGNOSIS — R404 Transient alteration of awareness: Secondary | ICD-10-CM | POA: Diagnosis not present

## 2018-04-28 DIAGNOSIS — R402441 Other coma, without documented Glasgow coma scale score, or with partial score reported, in the field [EMT or ambulance]: Secondary | ICD-10-CM | POA: Diagnosis not present

## 2018-04-28 DIAGNOSIS — F4489 Other dissociative and conversion disorders: Secondary | ICD-10-CM | POA: Diagnosis not present

## 2018-04-28 DIAGNOSIS — R531 Weakness: Secondary | ICD-10-CM | POA: Diagnosis not present

## 2018-04-28 DIAGNOSIS — I959 Hypotension, unspecified: Secondary | ICD-10-CM | POA: Diagnosis not present

## 2018-04-28 LAB — URINALYSIS, ROUTINE W REFLEX MICROSCOPIC
BACTERIA UA: NONE SEEN
Bilirubin Urine: NEGATIVE
Glucose, UA: NEGATIVE mg/dL
Ketones, ur: NEGATIVE mg/dL
Leukocytes, UA: NEGATIVE
NITRITE: NEGATIVE
Protein, ur: NEGATIVE mg/dL
RBC / HPF: >50 RBC/hpf — ABNORMAL HIGH (ref 0–5)
Specific Gravity, Urine: 1.02 (ref 1.005–1.030)
pH: 6 (ref 5.0–8.0)

## 2018-04-28 LAB — CBC WITH DIFFERENTIAL/PLATELET
Abs Immature Granulocytes: 0 10*3/uL (ref 0.0–0.1)
BASOS PCT: 1 %
Basophils Absolute: 0.1 10*3/uL (ref 0.0–0.1)
EOS ABS: 0.2 10*3/uL (ref 0.0–0.7)
EOS PCT: 3 %
HCT: 38 % (ref 36.0–46.0)
Hemoglobin: 12.2 g/dL (ref 12.0–15.0)
Immature Granulocytes: 0 %
Lymphocytes Relative: 26 %
Lymphs Abs: 1.7 10*3/uL (ref 0.7–4.0)
MCH: 28.6 pg (ref 26.0–34.0)
MCHC: 32.1 g/dL (ref 30.0–36.0)
MCV: 89 fL (ref 78.0–100.0)
MONO ABS: 0.5 10*3/uL (ref 0.1–1.0)
MONOS PCT: 7 %
Neutro Abs: 4.1 10*3/uL (ref 1.7–7.7)
Neutrophils Relative %: 63 %
PLATELETS: 220 10*3/uL (ref 150–400)
RBC: 4.27 MIL/uL (ref 3.87–5.11)
RDW: 12.5 % (ref 11.5–15.5)
WBC: 6.5 10*3/uL (ref 4.0–10.5)

## 2018-04-28 LAB — COMPREHENSIVE METABOLIC PANEL
ALBUMIN: 3.7 g/dL (ref 3.5–5.0)
ALT: 9 U/L — AB (ref 14–54)
AST: 15 U/L (ref 15–41)
Alkaline Phosphatase: 47 U/L (ref 38–126)
Anion gap: 8 (ref 5–15)
BUN: 21 mg/dL — AB (ref 6–20)
CALCIUM: 10.1 mg/dL (ref 8.9–10.3)
CO2: 28 mmol/L (ref 22–32)
Chloride: 108 mmol/L (ref 101–111)
Creatinine, Ser: 0.68 mg/dL (ref 0.44–1.00)
GFR calc non Af Amer: >60 mL/min (ref >60)
GLUCOSE: 126 mg/dL — AB (ref 65–99)
Potassium: 4 mmol/L (ref 3.5–5.1)
SODIUM: 144 mmol/L (ref 135–145)
Total Bilirubin: 0.3 mg/dL (ref 0.3–1.2)
Total Protein: 6.3 g/dL — ABNORMAL LOW (ref 6.5–8.1)

## 2018-04-28 LAB — I-STAT TROPONIN, ED: Troponin i, poc: 0 ng/mL (ref 0.00–0.08)

## 2018-04-28 LAB — CBG MONITORING, ED: Glucose-Capillary: 155 mg/dL — ABNORMAL HIGH (ref 65–99)

## 2018-04-28 NOTE — ED Triage Notes (Signed)
Pt arrived via [facility] from home, pt states she has no complaints.  Per EMS pt has [place] and took a typical nap today->family woke her up and helped her into her stair chair-> EMS reports family called EMS because they felt she wasn't waking up fast enough and didn't feel like she was alert enough.

## 2018-04-28 NOTE — ED Notes (Signed)
Patient transported to CT 

## 2018-04-28 NOTE — ED Notes (Signed)
Pt alert and oriented in NAD. Pt verbalized understanding of discharge instructions. 

## 2018-04-28 NOTE — Discharge Instructions (Addendum)
Please return for any problem. Follow up with you regular physician in 2-3 days as instructed.

## 2018-04-28 NOTE — ED Provider Notes (Signed)
[place] EMERGENCY DEPARTMENT Provider Note   CSN: [id number] Arrival date & time: 04/28/18  1700     History   Chief Complaint No chief complaint on file.   HPI Melissa Douglas is a 77 y.o. female.  77 year old female with prior history of Parkinson's disease presents with complaint of transient confusion.  Family reports the patient took a nap this afternoon and after the nap she was slow to arouse and appeared confused.  This was transient.  Symptoms lasted approximately 30 minutes.  Upon arrival to the ED the patient is at her baseline.  She had no reported focal weakness.  Patient has a history of sundowning usually sometime between 4-6 PM.  Symptoms today occurred prior to 4 PM.  On my evaluation, the patient is without complaint and is alert and oriented.  The history is provided by the patient, medical records and a relative.  Altered Mental Status   This is a recurrent problem. The current episode started 3 to 5 hours ago. The problem has been resolved. Associated symptoms include confusion and somnolence.    Past Medical History:  Diagnosis Date  . Cataract Right eye   HAD SURGERY  . Chronic joint pain   . Hx of adenomatous colonic polyps 08/18/2014  . Hypothyroidism   . Multiple nasal polyps   . Osteopenia   . Parkinson disease [facility])     Patient Active Problem List   Diagnosis Date Noted  . 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 Memory loss 12/13/2014  . Soft tissue mass 12/13/2014  . Recent skin changes 12/13/2014  . Hx of adenomatous colonic polyps 08/18/2014  . Fungal dermatitis 03/19/2014  . Overweight 03/19/2014  . Cataract   . Constipation, slow transit 09/03/2012  . Seasonal allergic rhinitis 04/18/2012  . General medical examination 05/18/2011  . Elevated BP 03/13/2011  . Vaginal dryness, menopausal 03/13/2011  . Vitamin D deficiency 03/13/2011  . Hypertriglyceridemia 03/13/2011  . OSTEOPENIA 05/31/2010  .  Parkinsons disease ([place]) 04/07/2010  . POSTMENOPAUSAL STATUS 04/07/2010  . 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 DRY MOUTH 03/07/2010  . 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 PARESTHESIA 01/26/2009  . Hypothyroidism 10/20/2008  . 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 ABDOMINAL HYSTERECTOMY     ovaries remain  . APPENDECTOMY    . CATARACT EXTRACTION    . TONSILLECTOMY       OB History   None      Home Medications    Prior to Admission medications   Medication Sig Start Date End Date Taking? Authorizing Provider  Carbidopa-Levodopa ER (SINEMET CR) 25-100 MG tablet controlled release Take 1 tablet 4 (four) times daily by mouth. 09/02/17   [provider]  cholecalciferol (VITAMIN D) 1000 units tablet Take 2,000 Units by mouth daily.    [provider]  clotrimazole (LOTRIMIN) 1 % cream Apply 1 application topically 2 (two) times daily.    [provider]  donepezil (ARICEPT) 5 MG tablet Take 1 tablet every evening by mouth. 09/20/17   [provider]  ipratropium (ATROVENT) 0.06 % nasal spray Place into the nose. 03/05/17   [provider]  levothyroxine (SYNTHROID, LEVOTHROID) 75 MCG tablet Take 1 tablet (75 mcg total) by mouth daily. 04/17/17   Midge Minium, MD  mirtazapine (REMERON) 30 MG tablet Take 1 tablet (30 mg total) by mouth at bedtime. 11/21/17   Nche, Charlene Brooke, NP  Pimavanserin Tartrate (NUPLAZID) 34 MG CAPS Take 34 mg by mouth.    [provider]    Family History History reviewed. No pertinent family history.  Social History Social History   Tobacco Use  . Smoking status: Former Smoker    Types: Cigarettes    Last attempt to quit: 01/01/2001    Years since quitting: 17.3  . Smokeless tobacco: Never Used  Substance Use Topics  . Alcohol use: No    Alcohol/week: 7.2 - 8.4 oz    Types: 12 - 14 Glasses of wine per week    Comment: not since being in  the facility   . Drug use: No     Allergies   Codeine; Ether; Gabapentin; Iodine; and Iodinated diagnostic agents   Review of Systems Review of Systems  Psychiatric/Behavioral: Positive for confusion.  All other systems reviewed and are negative.    Physical Exam Updated Vital Signs BP 125/79   Pulse 72   Temp 98 F (36.7 C) (Oral)   Resp 15   SpO2 97%   Physical Exam  Constitutional: She is oriented to person, place, and time. She appears well-developed and well-nourished. No distress.  HENT:  Head: Normocephalic and atraumatic.  Mouth/Throat: Oropharynx is clear and moist.  Eyes: Pupils are equal, round, and reactive to light. Conjunctivae and EOM are normal.  Neck: Normal range of motion. Neck supple.  Cardiovascular: Normal rate, regular rhythm and normal heart sounds.  Pulmonary/Chest: Effort normal and breath sounds normal. No respiratory distress.  Abdominal: Soft. She exhibits no distension. There is no tenderness.  Musculoskeletal: Normal range of motion. She exhibits no edema or deformity.  Neurological: She is alert and oriented to person, place, and time.  Mild parkinsonian tremor  Skin: Skin is warm and dry.  Psychiatric: She has a normal mood and affect.  Nursing note and vitals reviewed.    ED Treatments / Results  Labs (all labs ordered are listed, but only abnormal results are displayed) Labs Reviewed  URINALYSIS, ROUTINE W REFLEX MICROSCOPIC - Abnormal; Notable for the following components:      Result Value   Hgb urine dipstick LARGE (*)    RBC / HPF >50 (*)    All other components within normal limits  COMPREHENSIVE METABOLIC PANEL - Abnormal; Notable for the following components:   Glucose, Bld 126 (*)    BUN 21 (*)    Total Protein 6.3 (*)    ALT 9 (*)    All other components within normal limits  CBG MONITORING, ED - Abnormal; Notable for the following components:   Glucose-Capillary 155 (*)    All other components within normal  limits  CBC WITH DIFFERENTIAL/PLATELET  I-STAT TROPONIN, ED    EKG EKG Interpretation  Date/Time:  Monday April 28 2018 17:31:23 EDT Ventricular Rate:  71 PR Interval:    QRS Duration: 84 QT Interval:  413 QTC Calculation: 449 R Axis:   71 Text Interpretation:  Sinus rhythm Consider right atrial enlargement Confirmed by Dene Gentry [phone]) on 04/28/2018 5:42:44 PM   Radiology Ct Head Wo Contrast  Result Date: 04/28/2018 CLINICAL DATA:  77 year old female with altered mental status after napping. Initial encounter. EXAM: CT HEAD WITHOUT CONTRAST TECHNIQUE: Contiguous axial images were obtained from the base of the skull through the vertex without intravenous contrast. COMPARISON:  11/23/2017 head CT. FINDINGS: Brain: No intracranial hemorrhage or CT evidence of large acute infarct. Mild chronic microvascular changes. Moderate global atrophy. No intracranial mass lesion noted on  this unenhanced exam. Vascular: Vascular calcifications Skull: Negative Sinuses/Orbits: No acute orbital abnormality. Mild mucosal thickening inferior aspect maxillary sinuses. Other: Mastoid air cells and middle ear cavities are clear. IMPRESSION: No acute intracranial abnormality. Mild chronic microvascular changes. Moderate global atrophy. Mild mucosal thickening inferior aspect maxillary sinuses. Electronically Signed   By: Genia Del M.D.   On: 04/28/2018 19:19   Dg Chest Port 1 View  Result Date: 04/28/2018 CLINICAL DATA:  77 year old female with weakness today. EXAM: PORTABLE CHEST 1 VIEW COMPARISON:  Chest CT 01/31/2018 and earlier. FINDINGS: Portable AP semi upright view at 1726 hours. Allowing for portable technique the lungs are clear, the spiculated medial right upper lung opacity seen by CT is poorly visible radiographically. Normal cardiac size and mediastinal contours. Visualized tracheal air column is within normal limits. No pneumothorax.  No acute osseous abnormality identified. IMPRESSION: 1.  No  acute cardiopulmonary abnormality. 2. Medial right upper lung spiculated nodule being followed by CT (see report 01/31/2018). Electronically Signed   By: Genevie Ann M.D.   On: 04/28/2018 17:45    Procedures Procedures (including critical care time)  Medications Ordered in ED Medications - No data to display   Initial Impression / Assessment and Plan / ED Course  I have reviewed the triage vital signs and the nursing notes.  Pertinent labs & imaging results that were available during my care of the patient were reviewed by me and considered in my medical decision making (see chart for details).     MDM  Screen complete  Patient is presenting for evaluation of reported episode of transient confusion following awakening from a nap.  Screening labs including head CT do not show any acute pathology.  Patient is at her baseline mental status in the ED.  The patient declines further work-up or evaluation.  She desires discharge home.  Primary caregiver at home as her husband and he is in agreement with discharge home.  The patient and her husband declined further work-up in the ED, observation, or admission.  The importance of close follow-up is stressed.  Strict return precautions are given and understood.  Final Clinical Impressions(s) / ED Diagnoses   Final diagnoses:  Transient confusion    ED Discharge Orders    None       Valarie Merino, MD 04/28/18 2141

## 2018-04-28 NOTE — ED Notes (Signed)
X-ray at bedside

## 2018-04-28 NOTE — ED Notes (Signed)
MD at bedside talking to family

## 2018-05-28 ENCOUNTER — Other Ambulatory Visit: Payer: Self-pay | Admitting: Family Medicine

## 2018-05-28 DIAGNOSIS — R1312 Dysphagia, oropharyngeal phase: Secondary | ICD-10-CM | POA: Diagnosis not present

## 2018-05-28 DIAGNOSIS — E039 Hypothyroidism, unspecified: Secondary | ICD-10-CM | POA: Diagnosis not present

## 2018-05-28 DIAGNOSIS — R413 Other amnesia: Secondary | ICD-10-CM | POA: Diagnosis not present

## 2018-05-28 DIAGNOSIS — R030 Elevated blood-pressure reading, without diagnosis of hypertension: Secondary | ICD-10-CM | POA: Diagnosis not present

## 2018-05-28 DIAGNOSIS — G20 Parkinson's disease: Secondary | ICD-10-CM | POA: Diagnosis not present

## 2018-05-30 ENCOUNTER — Telehealth: Payer: Self-pay | Admitting: Family Medicine

## 2018-05-30 DIAGNOSIS — G20 Parkinson's disease: Secondary | ICD-10-CM | POA: Diagnosis not present

## 2018-05-30 DIAGNOSIS — R1312 Dysphagia, oropharyngeal phase: Secondary | ICD-10-CM | POA: Diagnosis not present

## 2018-05-30 DIAGNOSIS — R413 Other amnesia: Secondary | ICD-10-CM | POA: Diagnosis not present

## 2018-05-30 DIAGNOSIS — R030 Elevated blood-pressure reading, without diagnosis of hypertension: Secondary | ICD-10-CM | POA: Diagnosis not present

## 2018-05-30 DIAGNOSIS — E039 Hypothyroidism, unspecified: Secondary | ICD-10-CM | POA: Diagnosis not present

## 2018-05-30 NOTE — Telephone Encounter (Signed)
Copied from [place] [id number]. Topic: Quick Communication - See Telephone Encounter >> May 30, 2018  1:44 PM Mylinda Latina, NT wrote: CRM for notification. See Telephone encounter for: 05/30/18. Liji ( PT)  calling from [facility] is requesting verbal orders. The orders are 2x a week for 6 week and 1x a week for 1 week to work in gait, balance , and strengthening  . Please call with the verbals CB# [phone]

## 2018-05-30 NOTE — Telephone Encounter (Signed)
Called and verbal orders given  

## 2018-05-30 NOTE — Telephone Encounter (Signed)
Ok for verbal order  °

## 2018-05-30 NOTE — Telephone Encounter (Signed)
Ok for verbal orders ?

## 2018-06-03 DIAGNOSIS — E039 Hypothyroidism, unspecified: Secondary | ICD-10-CM | POA: Diagnosis not present

## 2018-06-03 DIAGNOSIS — G20 Parkinson's disease: Secondary | ICD-10-CM | POA: Diagnosis not present

## 2018-06-03 DIAGNOSIS — R413 Other amnesia: Secondary | ICD-10-CM | POA: Diagnosis not present

## 2018-06-03 DIAGNOSIS — R1312 Dysphagia, oropharyngeal phase: Secondary | ICD-10-CM | POA: Diagnosis not present

## 2018-06-03 DIAGNOSIS — R030 Elevated blood-pressure reading, without diagnosis of hypertension: Secondary | ICD-10-CM | POA: Diagnosis not present

## 2018-06-04 DIAGNOSIS — G20 Parkinson's disease: Secondary | ICD-10-CM | POA: Diagnosis not present

## 2018-06-04 DIAGNOSIS — R1312 Dysphagia, oropharyngeal phase: Secondary | ICD-10-CM | POA: Diagnosis not present

## 2018-06-04 DIAGNOSIS — E039 Hypothyroidism, unspecified: Secondary | ICD-10-CM | POA: Diagnosis not present

## 2018-06-04 DIAGNOSIS — R030 Elevated blood-pressure reading, without diagnosis of hypertension: Secondary | ICD-10-CM | POA: Diagnosis not present

## 2018-06-04 DIAGNOSIS — R413 Other amnesia: Secondary | ICD-10-CM | POA: Diagnosis not present

## 2018-06-05 DIAGNOSIS — R030 Elevated blood-pressure reading, without diagnosis of hypertension: Secondary | ICD-10-CM | POA: Diagnosis not present

## 2018-06-05 DIAGNOSIS — G20 Parkinson's disease: Secondary | ICD-10-CM | POA: Diagnosis not present

## 2018-06-05 DIAGNOSIS — E039 Hypothyroidism, unspecified: Secondary | ICD-10-CM | POA: Diagnosis not present

## 2018-06-05 DIAGNOSIS — R1312 Dysphagia, oropharyngeal phase: Secondary | ICD-10-CM | POA: Diagnosis not present

## 2018-06-05 DIAGNOSIS — R413 Other amnesia: Secondary | ICD-10-CM | POA: Diagnosis not present

## 2018-06-09 DIAGNOSIS — R413 Other amnesia: Secondary | ICD-10-CM | POA: Diagnosis not present

## 2018-06-09 DIAGNOSIS — E039 Hypothyroidism, unspecified: Secondary | ICD-10-CM | POA: Diagnosis not present

## 2018-06-09 DIAGNOSIS — R1312 Dysphagia, oropharyngeal phase: Secondary | ICD-10-CM | POA: Diagnosis not present

## 2018-06-09 DIAGNOSIS — G20 Parkinson's disease: Secondary | ICD-10-CM | POA: Diagnosis not present

## 2018-06-09 DIAGNOSIS — R030 Elevated blood-pressure reading, without diagnosis of hypertension: Secondary | ICD-10-CM | POA: Diagnosis not present

## 2018-06-10 DIAGNOSIS — R1312 Dysphagia, oropharyngeal phase: Secondary | ICD-10-CM | POA: Diagnosis not present

## 2018-06-10 DIAGNOSIS — G20 Parkinson's disease: Secondary | ICD-10-CM | POA: Diagnosis not present

## 2018-06-10 DIAGNOSIS — R413 Other amnesia: Secondary | ICD-10-CM | POA: Diagnosis not present

## 2018-06-10 DIAGNOSIS — R030 Elevated blood-pressure reading, without diagnosis of hypertension: Secondary | ICD-10-CM | POA: Diagnosis not present

## 2018-06-10 DIAGNOSIS — E039 Hypothyroidism, unspecified: Secondary | ICD-10-CM | POA: Diagnosis not present

## 2018-06-12 DIAGNOSIS — R413 Other amnesia: Secondary | ICD-10-CM | POA: Diagnosis not present

## 2018-06-12 DIAGNOSIS — G20 Parkinson's disease: Secondary | ICD-10-CM | POA: Diagnosis not present

## 2018-06-12 DIAGNOSIS — R030 Elevated blood-pressure reading, without diagnosis of hypertension: Secondary | ICD-10-CM | POA: Diagnosis not present

## 2018-06-12 DIAGNOSIS — R1312 Dysphagia, oropharyngeal phase: Secondary | ICD-10-CM | POA: Diagnosis not present

## 2018-06-12 DIAGNOSIS — E039 Hypothyroidism, unspecified: Secondary | ICD-10-CM | POA: Diagnosis not present

## 2018-06-17 DIAGNOSIS — G20 Parkinson's disease: Secondary | ICD-10-CM | POA: Diagnosis not present

## 2018-06-17 DIAGNOSIS — R030 Elevated blood-pressure reading, without diagnosis of hypertension: Secondary | ICD-10-CM | POA: Diagnosis not present

## 2018-06-17 DIAGNOSIS — E039 Hypothyroidism, unspecified: Secondary | ICD-10-CM | POA: Diagnosis not present

## 2018-06-17 DIAGNOSIS — R413 Other amnesia: Secondary | ICD-10-CM | POA: Diagnosis not present

## 2018-06-17 DIAGNOSIS — R1312 Dysphagia, oropharyngeal phase: Secondary | ICD-10-CM | POA: Diagnosis not present

## 2018-06-18 DIAGNOSIS — G20 Parkinson's disease: Secondary | ICD-10-CM | POA: Diagnosis not present

## 2018-06-18 DIAGNOSIS — K117 Disturbances of salivary secretion: Secondary | ICD-10-CM | POA: Diagnosis not present

## 2018-06-18 DIAGNOSIS — F0280 Dementia in other diseases classified elsewhere without behavioral disturbance: Secondary | ICD-10-CM | POA: Diagnosis not present

## 2018-06-18 DIAGNOSIS — Z79899 Other long term (current) drug therapy: Secondary | ICD-10-CM | POA: Diagnosis not present

## 2018-06-18 DIAGNOSIS — R441 Visual hallucinations: Secondary | ICD-10-CM | POA: Diagnosis not present

## 2018-06-19 DIAGNOSIS — R1312 Dysphagia, oropharyngeal phase: Secondary | ICD-10-CM | POA: Diagnosis not present

## 2018-06-19 DIAGNOSIS — G20 Parkinson's disease: Secondary | ICD-10-CM | POA: Diagnosis not present

## 2018-06-19 DIAGNOSIS — R030 Elevated blood-pressure reading, without diagnosis of hypertension: Secondary | ICD-10-CM | POA: Diagnosis not present

## 2018-06-19 DIAGNOSIS — R413 Other amnesia: Secondary | ICD-10-CM | POA: Diagnosis not present

## 2018-06-19 DIAGNOSIS — E039 Hypothyroidism, unspecified: Secondary | ICD-10-CM | POA: Diagnosis not present

## 2018-06-25 DIAGNOSIS — E039 Hypothyroidism, unspecified: Secondary | ICD-10-CM | POA: Diagnosis not present

## 2018-06-25 DIAGNOSIS — R030 Elevated blood-pressure reading, without diagnosis of hypertension: Secondary | ICD-10-CM | POA: Diagnosis not present

## 2018-06-25 DIAGNOSIS — R1312 Dysphagia, oropharyngeal phase: Secondary | ICD-10-CM | POA: Diagnosis not present

## 2018-06-25 DIAGNOSIS — R413 Other amnesia: Secondary | ICD-10-CM | POA: Diagnosis not present

## 2018-06-25 DIAGNOSIS — G20 Parkinson's disease: Secondary | ICD-10-CM | POA: Diagnosis not present

## 2018-06-26 DIAGNOSIS — R1312 Dysphagia, oropharyngeal phase: Secondary | ICD-10-CM | POA: Diagnosis not present

## 2018-06-26 DIAGNOSIS — E039 Hypothyroidism, unspecified: Secondary | ICD-10-CM | POA: Diagnosis not present

## 2018-06-26 DIAGNOSIS — G20 Parkinson's disease: Secondary | ICD-10-CM | POA: Diagnosis not present

## 2018-06-26 DIAGNOSIS — R413 Other amnesia: Secondary | ICD-10-CM | POA: Diagnosis not present

## 2018-06-26 DIAGNOSIS — R030 Elevated blood-pressure reading, without diagnosis of hypertension: Secondary | ICD-10-CM | POA: Diagnosis not present

## 2018-07-01 DIAGNOSIS — G20 Parkinson's disease: Secondary | ICD-10-CM | POA: Diagnosis not present

## 2018-07-01 DIAGNOSIS — E039 Hypothyroidism, unspecified: Secondary | ICD-10-CM | POA: Diagnosis not present

## 2018-07-01 DIAGNOSIS — R413 Other amnesia: Secondary | ICD-10-CM | POA: Diagnosis not present

## 2018-07-01 DIAGNOSIS — R030 Elevated blood-pressure reading, without diagnosis of hypertension: Secondary | ICD-10-CM | POA: Diagnosis not present

## 2018-07-01 DIAGNOSIS — R1312 Dysphagia, oropharyngeal phase: Secondary | ICD-10-CM | POA: Diagnosis not present

## 2018-07-03 DIAGNOSIS — R413 Other amnesia: Secondary | ICD-10-CM | POA: Diagnosis not present

## 2018-07-03 DIAGNOSIS — G20 Parkinson's disease: Secondary | ICD-10-CM | POA: Diagnosis not present

## 2018-07-03 DIAGNOSIS — R030 Elevated blood-pressure reading, without diagnosis of hypertension: Secondary | ICD-10-CM | POA: Diagnosis not present

## 2018-07-03 DIAGNOSIS — E039 Hypothyroidism, unspecified: Secondary | ICD-10-CM | POA: Diagnosis not present

## 2018-07-03 DIAGNOSIS — R1312 Dysphagia, oropharyngeal phase: Secondary | ICD-10-CM | POA: Diagnosis not present

## 2018-07-07 ENCOUNTER — Ambulatory Visit: Payer: Self-pay

## 2018-07-07 NOTE — Telephone Encounter (Signed)
FYI

## 2018-07-07 NOTE — Telephone Encounter (Signed)
Pt.'s husband reports last week pt. Started having loose/watery stools. Maybe 1-2 a day or every other day. No cramping. No nausea or vomiting.No fever or blood in stool. Eating and drinking well. No one in the family has been sick. Husband reports "She is a little better today." States he will try Imodium. If continues, will call us back.   Reason for Disposition . MILD-MODERATE diarrhea (e.g., 1-6 times / day more than normal)  Answer Assessment - Initial Assessment Questions 1. DIARRHEA SEVERITY: "How bad is the diarrhea?" "How many extra stools have you had in the past 24 hours than normal?"    - NO DIARRHEA (SCALE 0)   - MILD (SCALE 1-3): Few loose or mushy BMs; increase of 1-3 stools over normal daily number of stools; mild increase in ostomy output.   -  MODERATE (SCALE 4-7): Increase of 4-6 stools daily over normal; moderate increase in ostomy output. * SEVERE (SCALE 8-10; OR 'WORST POSSIBLE'): Increase of 7 or more stools daily over normal; moderate increase in ostomy output; incontinence.     Mild 2. ONSET: "When did the diarrhea begin?"      Last week 3. BM CONSISTENCY: "How loose or watery is the diarrhea?"      Watery 4. VOMITING: "Are you also vomiting?" If so, ask: "How many times in the past 24 hours?"      No 5. ABDOMINAL PAIN: "Are you having any abdominal pain?" If yes: "What does it feel like?" (e.g., crampy, dull, intermittent, constant)      No 6. ABDOMINAL PAIN SEVERITY: If present, ask: "How bad is the pain?"  (e.g., Scale 1-10; mild, moderate, or severe)   - MILD (1-3): doesn't interfere with normal activities, abdomen soft and not tender to touch    - MODERATE (4-7): interferes with normal activities or awakens from sleep, tender to touch    - SEVERE (8-10): excruciating pain, doubled over, unable to do any normal activities       No 7. ORAL INTAKE: If vomiting, "Have you been able to drink liquids?" "How much fluids have you had in the past 24 hours?"     Eating  well 8. HYDRATION: "Any signs of dehydration?" (e.g., dry mouth [not just dry lips], too weak to stand, dizziness, new weight loss) "When did you last urinate?"     No 9. EXPOSURE: "Have you traveled to a foreign country recently?" "Have you been exposed to anyone with diarrhea?" "Could you have eaten any food that was spoiled?"     No 10. ANTIBIOTIC USE: "Are you taking antibiotics now or have you taken antibiotics in the past 2 months?"       No 11. OTHER SYMPTOMS: "Do you have any other symptoms?" (e.g., fever, blood in stool)       No 12. PREGNANCY: "Is there any chance you are pregnant?" "When was your last menstrual period?"       No  Protocols used: DIARRHEA-A-AH

## 2018-07-09 ENCOUNTER — Ambulatory Visit (INDEPENDENT_AMBULATORY_CARE_PROVIDER_SITE_OTHER): Payer: Medicare Other | Admitting: Family Medicine

## 2018-07-09 ENCOUNTER — Encounter: Payer: Self-pay | Admitting: Family Medicine

## 2018-07-09 ENCOUNTER — Other Ambulatory Visit: Payer: Self-pay

## 2018-07-09 VITALS — BP 123/82 | HR 116 | Temp 98.6°F | Resp 17 | Ht 62.0 in

## 2018-07-09 DIAGNOSIS — R0982 Postnasal drip: Secondary | ICD-10-CM

## 2018-07-09 MED ORDER — CETIRIZINE HCL 10 MG PO TABS: 10.0000 mg | ORAL_TABLET | Freq: Every day | ORAL | 11 refills | Status: DC

## 2018-07-09 MED ORDER — FLUTICASONE PROPIONATE 50 MCG/ACT NA SUSP: 2.0000 | Freq: Every day | NASAL | 6 refills | Status: DC

## 2018-07-09 NOTE — Progress Notes (Signed)
   Subjective:    Patient ID: Melissa Douglas, female    DOB: 1941/04/22, 77 y.o.   MRN: [id number]  HPI Nasal congestion- husband reports he had to wake her last night due to the copious amount of drainage, 'I didn't like how it sounds'.  Drainage is clear but he is 'concerned about her lungs'.  Husband gave her Nyquil and 2 teaspoons of honey.  Not currently on allergy medication.   Review of Systems For ROS see HPI     Objective:   Physical Exam  Constitutional: She is oriented to person, place, and time. She appears well-developed and well-nourished. No distress.  HENT:  Head: Normocephalic and atraumatic.  Right Ear: Tympanic membrane normal.  Left Ear: Tympanic membrane normal.  Nose: Mucosal edema and rhinorrhea present. Right sinus exhibits no maxillary sinus tenderness and no frontal sinus tenderness. Left sinus exhibits no maxillary sinus tenderness and no frontal sinus tenderness.  Mouth/Throat: Mucous membranes are normal. Posterior oropharyngeal erythema (w/ PND) present.  Eyes: Pupils are equal, round, and reactive to light. Conjunctivae and EOM are normal.  Neck: Normal range of motion. Neck supple.  Cardiovascular: Normal rate, regular rhythm and normal heart sounds.  Pulmonary/Chest: Effort normal and breath sounds normal. No respiratory distress. She has no wheezes. She has no rales.  Lymphadenopathy:    She has no cervical adenopathy.  Neurological: She is alert and oriented to person, place, and time.  Vitals reviewed.         Assessment & Plan:  PND- new.  Pt's gurgling at night is most likely due to her copious PND.  Start daily antihistamine.  Add nasal steroid spray.  Reviewed supportive care and red flags that should prompt return.  Pt expressed understanding and is in agreement w/ plan.

## 2018-07-09 NOTE — Patient Instructions (Signed)
Follow up as needed or as scheduled START the Cetirizine once daily (generic Zyrtec) to decrease congestion and drainage ADD the Fluticasone nasal spray- 2 sprays each nostril daily Try and sleep propped up to help w/ drainage Call with any questions or concerns Hang in there!

## 2018-07-10 ENCOUNTER — Telehealth: Payer: Self-pay | Admitting: Family Medicine

## 2018-07-10 NOTE — Telephone Encounter (Signed)
FYI

## 2018-07-10 NOTE — Telephone Encounter (Signed)
Copied from [place] [id number]. Topic: Quick Communication - See Telephone Encounter >> Jul 10, 2018  1:24 PM Ivar Drape wrote: CRM for notification. See Telephone encounter for: 07/10/18. Liji a physical Therapist w/[facility] [phone] wanted the provider to know the patient was not feeling well all week, so she didn't receive any physical therapy.  This appt will be added to the end of the episode and possibly a re-certification because the patient might not have reached her goals by the end of the present schedule.

## 2018-07-15 DIAGNOSIS — R413 Other amnesia: Secondary | ICD-10-CM | POA: Diagnosis not present

## 2018-07-15 DIAGNOSIS — R1312 Dysphagia, oropharyngeal phase: Secondary | ICD-10-CM | POA: Diagnosis not present

## 2018-07-15 DIAGNOSIS — R030 Elevated blood-pressure reading, without diagnosis of hypertension: Secondary | ICD-10-CM | POA: Diagnosis not present

## 2018-07-15 DIAGNOSIS — E039 Hypothyroidism, unspecified: Secondary | ICD-10-CM | POA: Diagnosis not present

## 2018-07-15 DIAGNOSIS — G20 Parkinson's disease: Secondary | ICD-10-CM | POA: Diagnosis not present

## 2018-07-17 DIAGNOSIS — Z9229 Personal history of other drug therapy: Secondary | ICD-10-CM | POA: Diagnosis not present

## 2018-07-17 DIAGNOSIS — G248 Other dystonia: Secondary | ICD-10-CM | POA: Diagnosis not present

## 2018-07-18 NOTE — Telephone Encounter (Signed)
FYI

## 2018-07-18 NOTE — Telephone Encounter (Signed)
liji from Home health called again and stated that patient was not feeling well today and will add this visit to the end of episode. Please advise

## 2018-07-22 DIAGNOSIS — E039 Hypothyroidism, unspecified: Secondary | ICD-10-CM | POA: Diagnosis not present

## 2018-07-22 DIAGNOSIS — R413 Other amnesia: Secondary | ICD-10-CM | POA: Diagnosis not present

## 2018-07-22 DIAGNOSIS — R030 Elevated blood-pressure reading, without diagnosis of hypertension: Secondary | ICD-10-CM | POA: Diagnosis not present

## 2018-07-22 DIAGNOSIS — G20 Parkinson's disease: Secondary | ICD-10-CM | POA: Diagnosis not present

## 2018-07-22 DIAGNOSIS — R1312 Dysphagia, oropharyngeal phase: Secondary | ICD-10-CM | POA: Diagnosis not present

## 2018-07-24 DIAGNOSIS — R413 Other amnesia: Secondary | ICD-10-CM | POA: Diagnosis not present

## 2018-07-24 DIAGNOSIS — R030 Elevated blood-pressure reading, without diagnosis of hypertension: Secondary | ICD-10-CM | POA: Diagnosis not present

## 2018-07-24 DIAGNOSIS — G20 Parkinson's disease: Secondary | ICD-10-CM | POA: Diagnosis not present

## 2018-07-24 DIAGNOSIS — R1312 Dysphagia, oropharyngeal phase: Secondary | ICD-10-CM | POA: Diagnosis not present

## 2018-07-24 DIAGNOSIS — E039 Hypothyroidism, unspecified: Secondary | ICD-10-CM | POA: Diagnosis not present

## 2018-07-27 DIAGNOSIS — E039 Hypothyroidism, unspecified: Secondary | ICD-10-CM | POA: Diagnosis not present

## 2018-07-27 DIAGNOSIS — R030 Elevated blood-pressure reading, without diagnosis of hypertension: Secondary | ICD-10-CM | POA: Diagnosis not present

## 2018-07-27 DIAGNOSIS — R413 Other amnesia: Secondary | ICD-10-CM | POA: Diagnosis not present

## 2018-07-27 DIAGNOSIS — G20 Parkinson's disease: Secondary | ICD-10-CM | POA: Diagnosis not present

## 2018-07-27 DIAGNOSIS — Z79891 Long term (current) use of opiate analgesic: Secondary | ICD-10-CM | POA: Diagnosis not present

## 2018-07-27 DIAGNOSIS — R1312 Dysphagia, oropharyngeal phase: Secondary | ICD-10-CM | POA: Diagnosis not present

## 2018-07-28 ENCOUNTER — Telehealth: Payer: Self-pay | Admitting: Family Medicine

## 2018-07-28 NOTE — Telephone Encounter (Signed)
Copied from [place] [phone]. Topic: General - Other >> Jul 28, 2018  9:53 AM Valla Leaver wrote: Reason for CRM: Liji, PT with [facility] calling to get recertification for home health PT and an eval for OT eval at home.

## 2018-07-28 NOTE — Telephone Encounter (Signed)
Called and verbal ok given.  

## 2018-07-28 NOTE — Telephone Encounter (Signed)
Ok for orders? 

## 2018-07-28 NOTE — Telephone Encounter (Signed)
Ok for verbal 

## 2018-07-29 DIAGNOSIS — R1312 Dysphagia, oropharyngeal phase: Secondary | ICD-10-CM | POA: Diagnosis not present

## 2018-07-29 DIAGNOSIS — Z79891 Long term (current) use of opiate analgesic: Secondary | ICD-10-CM | POA: Diagnosis not present

## 2018-07-29 DIAGNOSIS — R413 Other amnesia: Secondary | ICD-10-CM | POA: Diagnosis not present

## 2018-07-29 DIAGNOSIS — G20 Parkinson's disease: Secondary | ICD-10-CM | POA: Diagnosis not present

## 2018-07-29 DIAGNOSIS — E039 Hypothyroidism, unspecified: Secondary | ICD-10-CM | POA: Diagnosis not present

## 2018-07-29 DIAGNOSIS — R030 Elevated blood-pressure reading, without diagnosis of hypertension: Secondary | ICD-10-CM | POA: Diagnosis not present

## 2018-07-31 DIAGNOSIS — G20 Parkinson's disease: Secondary | ICD-10-CM | POA: Diagnosis not present

## 2018-07-31 DIAGNOSIS — R1312 Dysphagia, oropharyngeal phase: Secondary | ICD-10-CM | POA: Diagnosis not present

## 2018-07-31 DIAGNOSIS — R030 Elevated blood-pressure reading, without diagnosis of hypertension: Secondary | ICD-10-CM | POA: Diagnosis not present

## 2018-07-31 DIAGNOSIS — Z79891 Long term (current) use of opiate analgesic: Secondary | ICD-10-CM | POA: Diagnosis not present

## 2018-07-31 DIAGNOSIS — R413 Other amnesia: Secondary | ICD-10-CM | POA: Diagnosis not present

## 2018-07-31 DIAGNOSIS — E039 Hypothyroidism, unspecified: Secondary | ICD-10-CM | POA: Diagnosis not present

## 2018-08-01 DIAGNOSIS — E039 Hypothyroidism, unspecified: Secondary | ICD-10-CM | POA: Diagnosis not present

## 2018-08-01 DIAGNOSIS — G20 Parkinson's disease: Secondary | ICD-10-CM | POA: Diagnosis not present

## 2018-08-01 DIAGNOSIS — R1312 Dysphagia, oropharyngeal phase: Secondary | ICD-10-CM | POA: Diagnosis not present

## 2018-08-01 DIAGNOSIS — Z79891 Long term (current) use of opiate analgesic: Secondary | ICD-10-CM | POA: Diagnosis not present

## 2018-08-01 DIAGNOSIS — R413 Other amnesia: Secondary | ICD-10-CM | POA: Diagnosis not present

## 2018-08-01 DIAGNOSIS — R030 Elevated blood-pressure reading, without diagnosis of hypertension: Secondary | ICD-10-CM | POA: Diagnosis not present

## 2018-08-02 IMAGING — DX DG CHEST 2V
2 series · 2 of 2 positions shown · non-contrast
Comparison: 06/11/2017

CLINICAL DATA: Altered mental status.

EXAM:
CHEST  2 VIEW

[chest lat]
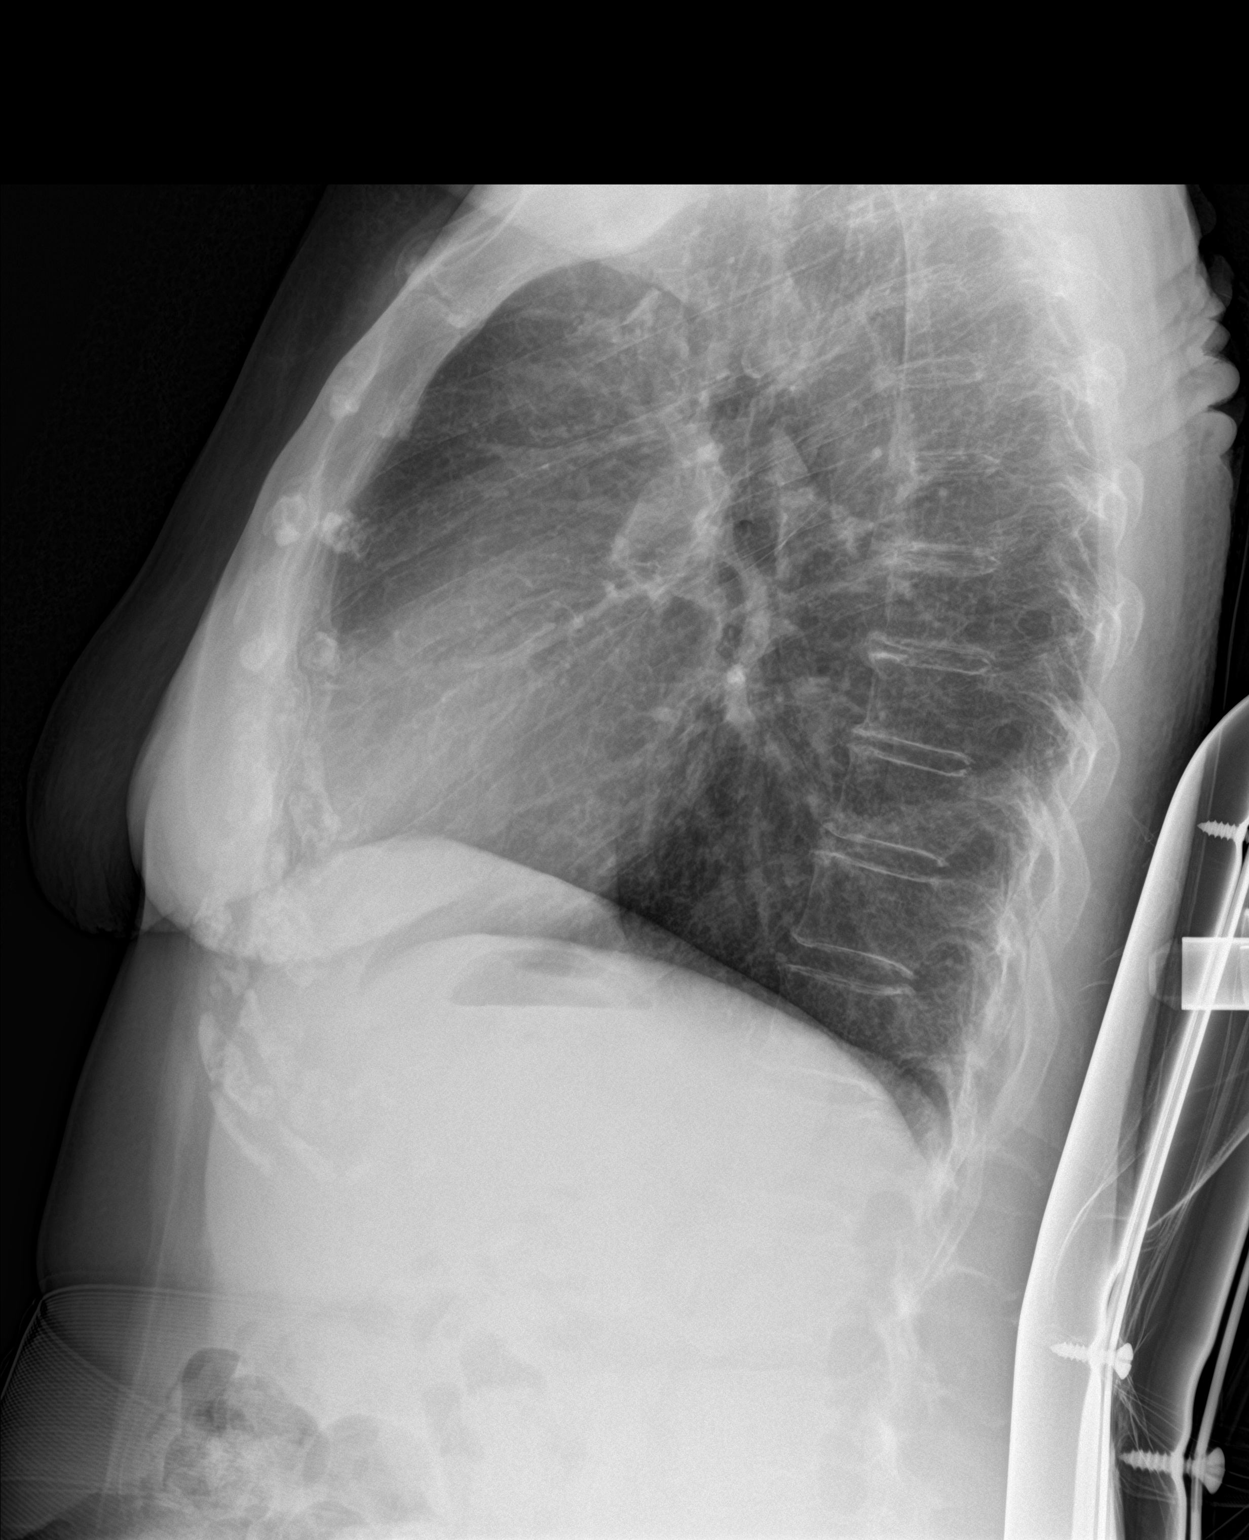

[chest ap]
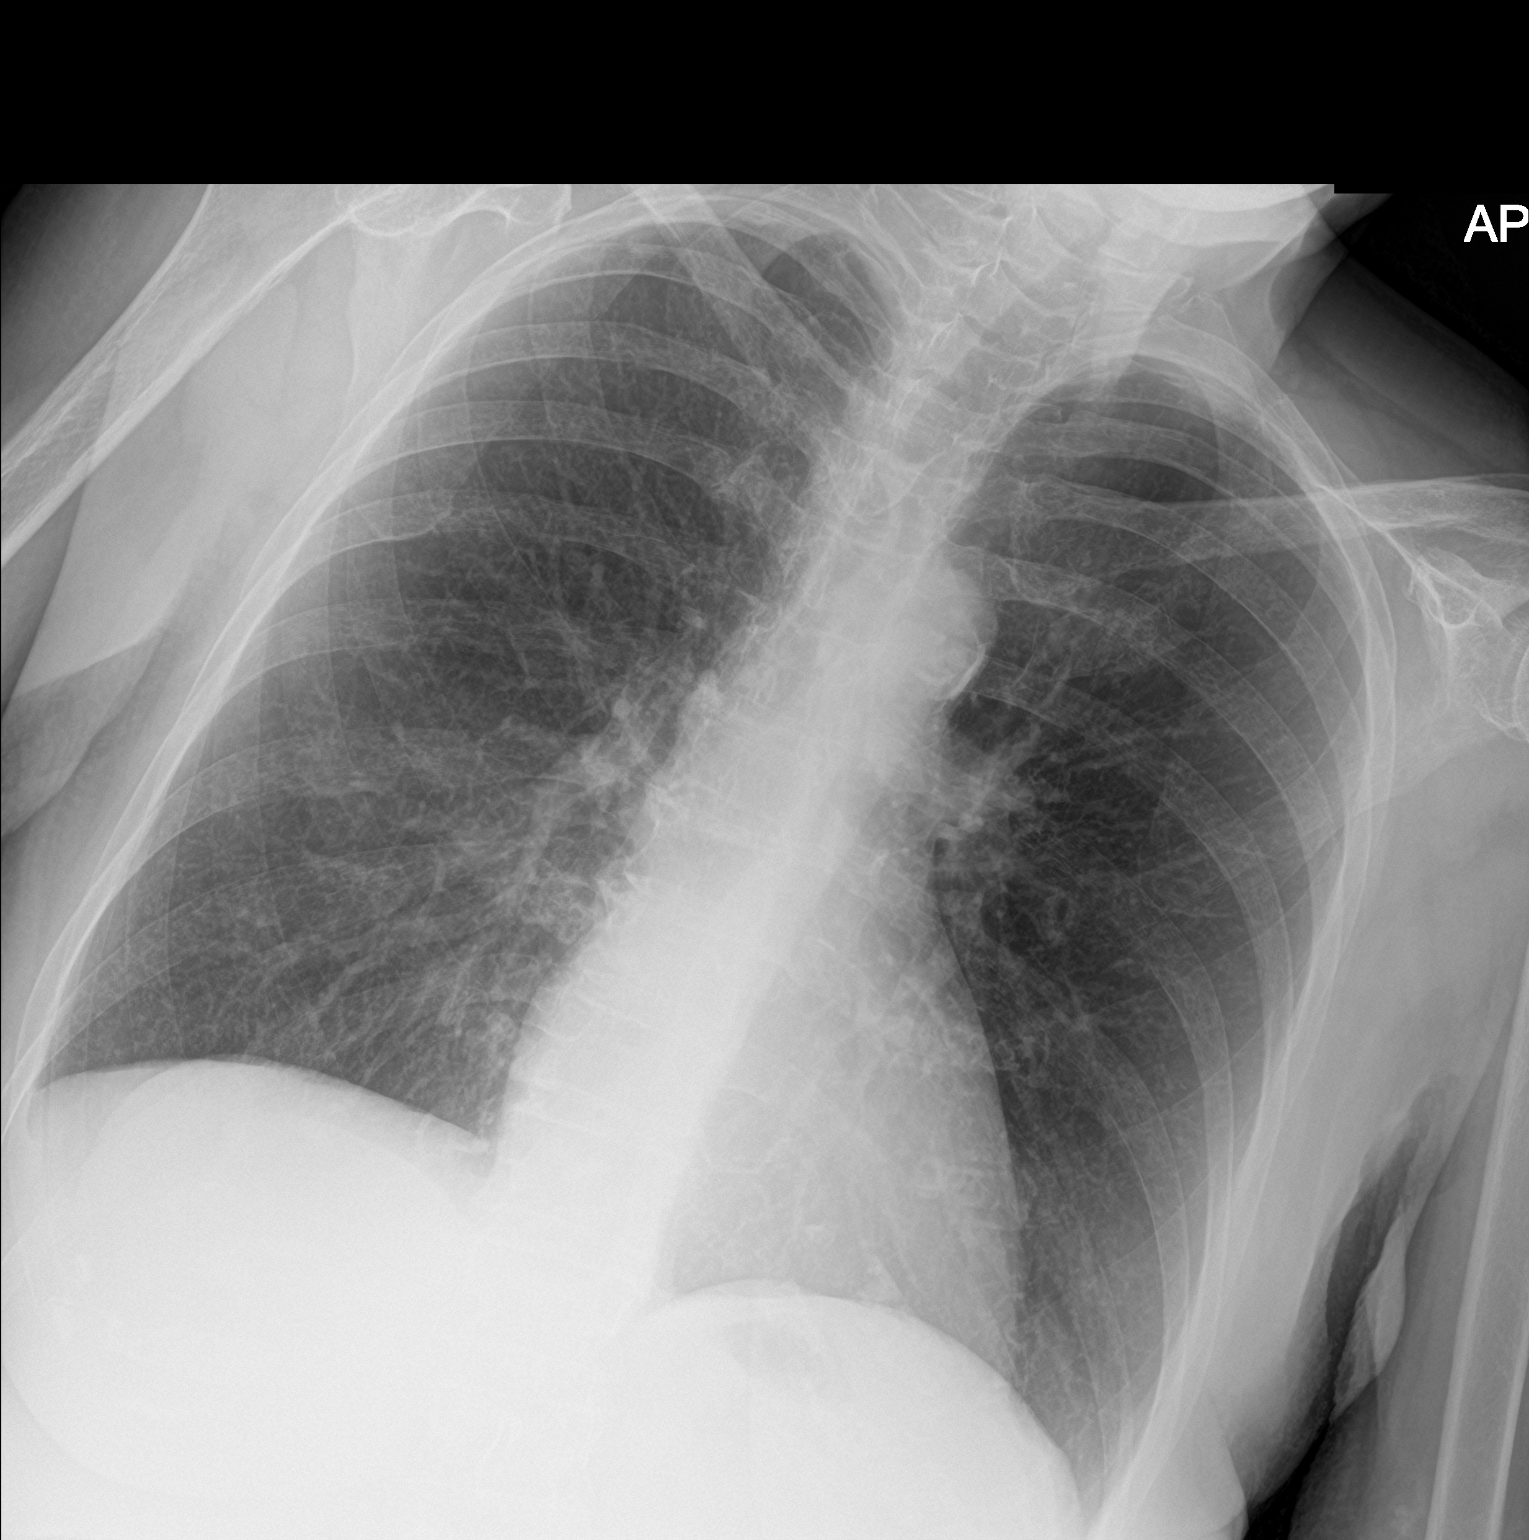

[2 of 2 positions shown; findings below may reference images not displayed]

FINDINGS: The cardiomediastinal silhouette is unremarkable.

There is no evidence of focal airspace disease, pulmonary edema,
suspicious pulmonary nodule/mass, pleural effusion, or pneumothorax.

No acute bony abnormalities are identified.
IMPRESSION: No active cardiopulmonary disease.

## 2018-08-03 IMAGING — DX DG WRIST COMPLETE 3+V*L*
3 series · 3 of 3 positions shown · non-contrast
Comparison: None.

CLINICAL DATA: 76-year-old female with fall and left hand pain.

EXAM:
LEFT WRIST - COMPLETE 3+ VIEW; LEFT HAND - COMPLETE 3+ VIEW

[wrist ap]
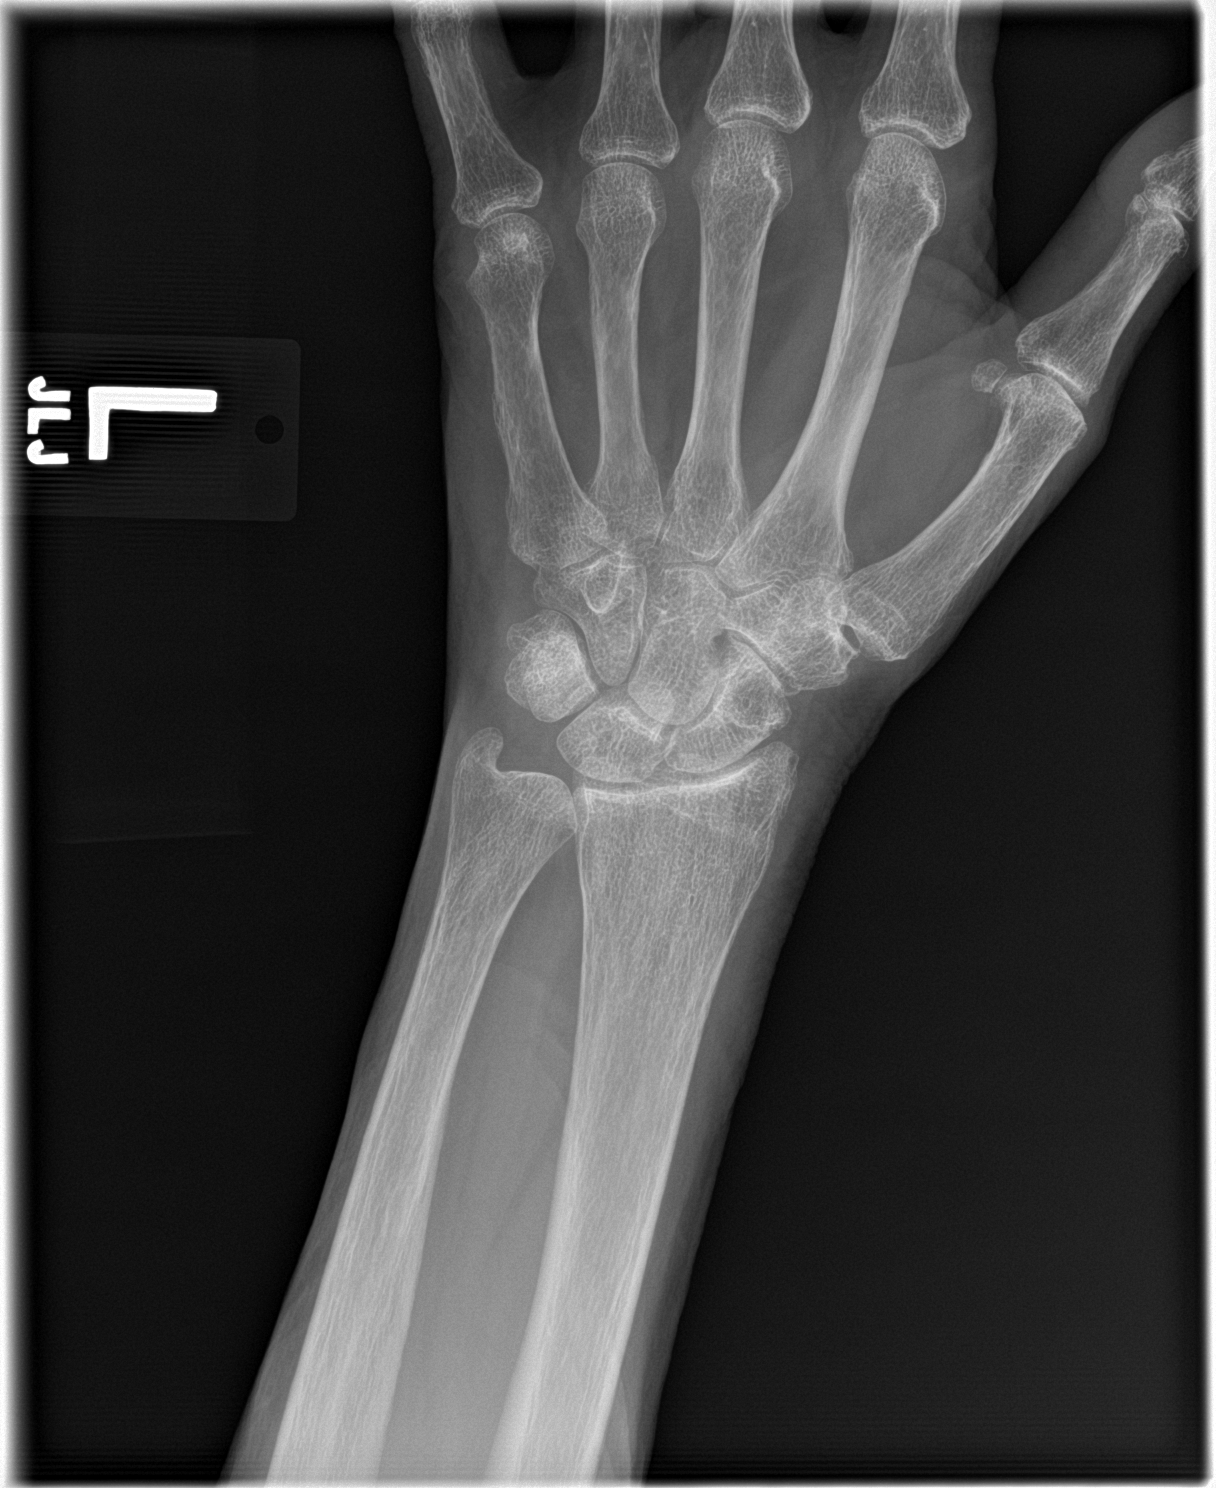

[wrist obl]
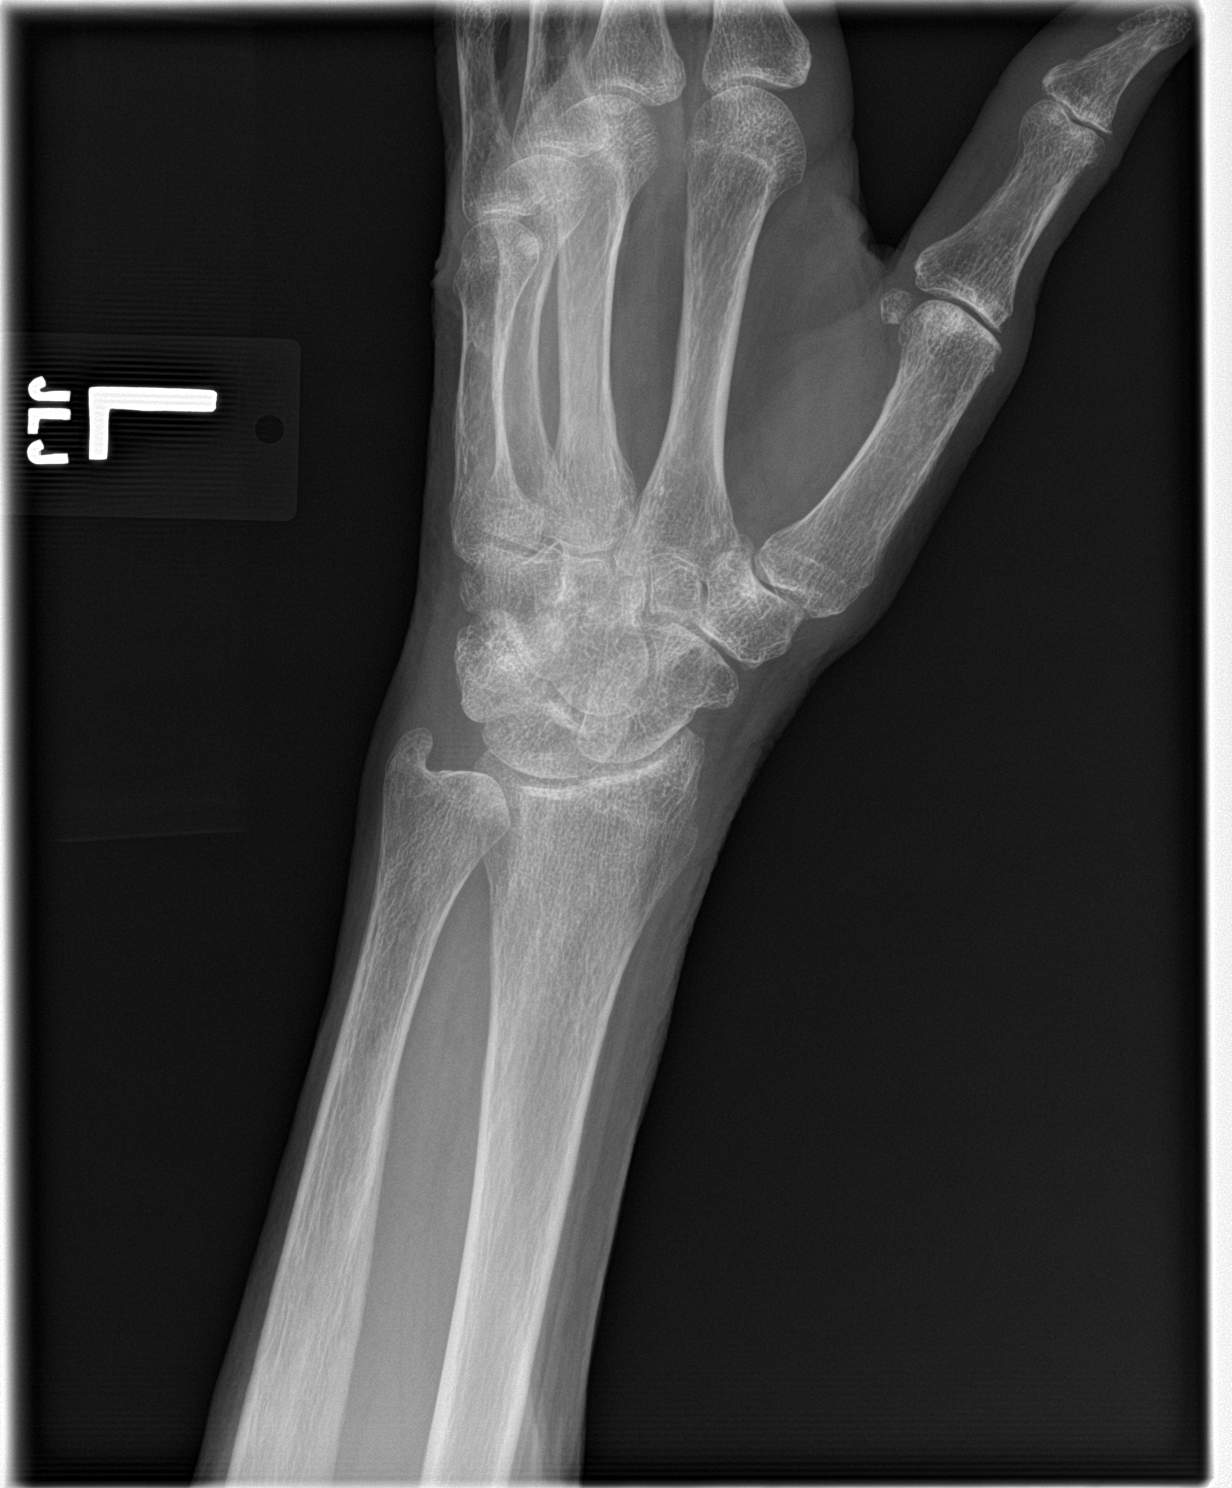

[wrist tunnel]
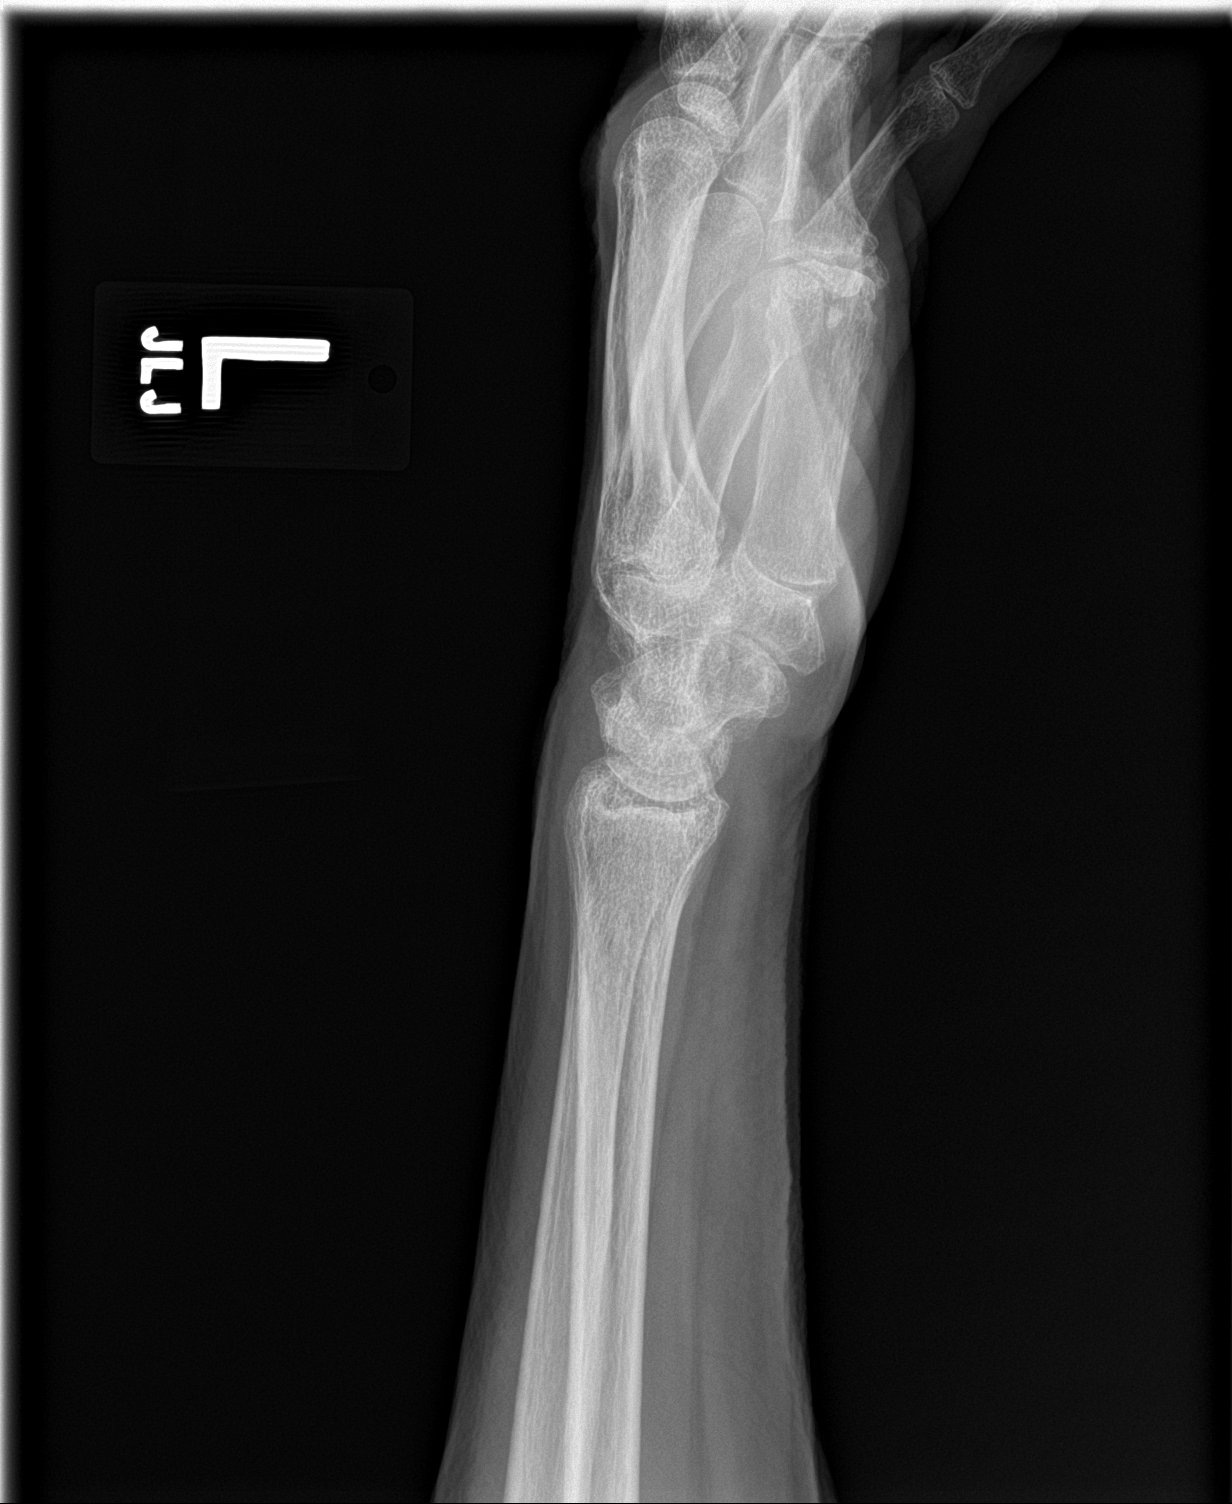

[3 of 3 positions shown; findings below may reference images not displayed]

FINDINGS: There is no acute fracture or dislocation. The bones are osteopenic.
The soft tissues appear unremarkable. No radiopaque foreign object.
IMPRESSION: No acute fracture or dislocation.

## 2018-08-05 ENCOUNTER — Encounter: Payer: Self-pay | Admitting: Family Medicine

## 2018-08-05 DIAGNOSIS — E039 Hypothyroidism, unspecified: Secondary | ICD-10-CM | POA: Diagnosis not present

## 2018-08-05 DIAGNOSIS — R1312 Dysphagia, oropharyngeal phase: Secondary | ICD-10-CM | POA: Diagnosis not present

## 2018-08-05 DIAGNOSIS — Z79891 Long term (current) use of opiate analgesic: Secondary | ICD-10-CM | POA: Diagnosis not present

## 2018-08-05 DIAGNOSIS — R030 Elevated blood-pressure reading, without diagnosis of hypertension: Secondary | ICD-10-CM | POA: Diagnosis not present

## 2018-08-05 DIAGNOSIS — G20 Parkinson's disease: Secondary | ICD-10-CM | POA: Diagnosis not present

## 2018-08-05 DIAGNOSIS — R413 Other amnesia: Secondary | ICD-10-CM | POA: Diagnosis not present

## 2018-08-07 DIAGNOSIS — R413 Other amnesia: Secondary | ICD-10-CM | POA: Diagnosis not present

## 2018-08-07 DIAGNOSIS — R1312 Dysphagia, oropharyngeal phase: Secondary | ICD-10-CM | POA: Diagnosis not present

## 2018-08-07 DIAGNOSIS — R030 Elevated blood-pressure reading, without diagnosis of hypertension: Secondary | ICD-10-CM | POA: Diagnosis not present

## 2018-08-07 DIAGNOSIS — Z79891 Long term (current) use of opiate analgesic: Secondary | ICD-10-CM | POA: Diagnosis not present

## 2018-08-07 DIAGNOSIS — E039 Hypothyroidism, unspecified: Secondary | ICD-10-CM | POA: Diagnosis not present

## 2018-08-07 DIAGNOSIS — G20 Parkinson's disease: Secondary | ICD-10-CM | POA: Diagnosis not present

## 2018-08-12 DIAGNOSIS — R1312 Dysphagia, oropharyngeal phase: Secondary | ICD-10-CM | POA: Diagnosis not present

## 2018-08-12 DIAGNOSIS — R030 Elevated blood-pressure reading, without diagnosis of hypertension: Secondary | ICD-10-CM | POA: Diagnosis not present

## 2018-08-12 DIAGNOSIS — R413 Other amnesia: Secondary | ICD-10-CM | POA: Diagnosis not present

## 2018-08-12 DIAGNOSIS — Z79891 Long term (current) use of opiate analgesic: Secondary | ICD-10-CM | POA: Diagnosis not present

## 2018-08-12 DIAGNOSIS — E039 Hypothyroidism, unspecified: Secondary | ICD-10-CM | POA: Diagnosis not present

## 2018-08-12 DIAGNOSIS — G20 Parkinson's disease: Secondary | ICD-10-CM | POA: Diagnosis not present

## 2018-08-13 DIAGNOSIS — R413 Other amnesia: Secondary | ICD-10-CM | POA: Diagnosis not present

## 2018-08-13 DIAGNOSIS — R030 Elevated blood-pressure reading, without diagnosis of hypertension: Secondary | ICD-10-CM | POA: Diagnosis not present

## 2018-08-13 DIAGNOSIS — E039 Hypothyroidism, unspecified: Secondary | ICD-10-CM | POA: Diagnosis not present

## 2018-08-13 DIAGNOSIS — Z79891 Long term (current) use of opiate analgesic: Secondary | ICD-10-CM | POA: Diagnosis not present

## 2018-08-13 DIAGNOSIS — R1312 Dysphagia, oropharyngeal phase: Secondary | ICD-10-CM | POA: Diagnosis not present

## 2018-08-13 DIAGNOSIS — G20 Parkinson's disease: Secondary | ICD-10-CM | POA: Diagnosis not present

## 2018-08-18 DIAGNOSIS — R413 Other amnesia: Secondary | ICD-10-CM | POA: Diagnosis not present

## 2018-08-18 DIAGNOSIS — Z79891 Long term (current) use of opiate analgesic: Secondary | ICD-10-CM | POA: Diagnosis not present

## 2018-08-18 DIAGNOSIS — R1312 Dysphagia, oropharyngeal phase: Secondary | ICD-10-CM | POA: Diagnosis not present

## 2018-08-18 DIAGNOSIS — R030 Elevated blood-pressure reading, without diagnosis of hypertension: Secondary | ICD-10-CM | POA: Diagnosis not present

## 2018-08-18 DIAGNOSIS — E039 Hypothyroidism, unspecified: Secondary | ICD-10-CM | POA: Diagnosis not present

## 2018-08-18 DIAGNOSIS — G20 Parkinson's disease: Secondary | ICD-10-CM | POA: Diagnosis not present

## 2018-08-19 DIAGNOSIS — R413 Other amnesia: Secondary | ICD-10-CM | POA: Diagnosis not present

## 2018-08-19 DIAGNOSIS — R030 Elevated blood-pressure reading, without diagnosis of hypertension: Secondary | ICD-10-CM | POA: Diagnosis not present

## 2018-08-19 DIAGNOSIS — Z79891 Long term (current) use of opiate analgesic: Secondary | ICD-10-CM | POA: Diagnosis not present

## 2018-08-19 DIAGNOSIS — E039 Hypothyroidism, unspecified: Secondary | ICD-10-CM | POA: Diagnosis not present

## 2018-08-19 DIAGNOSIS — R1312 Dysphagia, oropharyngeal phase: Secondary | ICD-10-CM | POA: Diagnosis not present

## 2018-08-19 DIAGNOSIS — G20 Parkinson's disease: Secondary | ICD-10-CM | POA: Diagnosis not present

## 2018-08-20 DIAGNOSIS — R918 Other nonspecific abnormal finding of lung field: Secondary | ICD-10-CM | POA: Diagnosis not present

## 2018-08-20 DIAGNOSIS — R911 Solitary pulmonary nodule: Secondary | ICD-10-CM | POA: Diagnosis not present

## 2018-08-21 DIAGNOSIS — R1312 Dysphagia, oropharyngeal phase: Secondary | ICD-10-CM | POA: Diagnosis not present

## 2018-08-21 DIAGNOSIS — Z79891 Long term (current) use of opiate analgesic: Secondary | ICD-10-CM | POA: Diagnosis not present

## 2018-08-21 DIAGNOSIS — R030 Elevated blood-pressure reading, without diagnosis of hypertension: Secondary | ICD-10-CM | POA: Diagnosis not present

## 2018-08-21 DIAGNOSIS — E039 Hypothyroidism, unspecified: Secondary | ICD-10-CM | POA: Diagnosis not present

## 2018-08-21 DIAGNOSIS — G20 Parkinson's disease: Secondary | ICD-10-CM | POA: Diagnosis not present

## 2018-08-21 DIAGNOSIS — R413 Other amnesia: Secondary | ICD-10-CM | POA: Diagnosis not present

## 2018-08-22 DIAGNOSIS — R1312 Dysphagia, oropharyngeal phase: Secondary | ICD-10-CM | POA: Diagnosis not present

## 2018-08-22 DIAGNOSIS — Z79891 Long term (current) use of opiate analgesic: Secondary | ICD-10-CM | POA: Diagnosis not present

## 2018-08-22 DIAGNOSIS — R413 Other amnesia: Secondary | ICD-10-CM | POA: Diagnosis not present

## 2018-08-22 DIAGNOSIS — R030 Elevated blood-pressure reading, without diagnosis of hypertension: Secondary | ICD-10-CM | POA: Diagnosis not present

## 2018-08-22 DIAGNOSIS — E039 Hypothyroidism, unspecified: Secondary | ICD-10-CM | POA: Diagnosis not present

## 2018-08-22 DIAGNOSIS — G20 Parkinson's disease: Secondary | ICD-10-CM | POA: Diagnosis not present

## 2018-08-24 ENCOUNTER — Other Ambulatory Visit: Payer: Self-pay | Admitting: Family Medicine

## 2018-08-25 DIAGNOSIS — E039 Hypothyroidism, unspecified: Secondary | ICD-10-CM | POA: Diagnosis not present

## 2018-08-25 DIAGNOSIS — R413 Other amnesia: Secondary | ICD-10-CM | POA: Diagnosis not present

## 2018-08-25 DIAGNOSIS — R030 Elevated blood-pressure reading, without diagnosis of hypertension: Secondary | ICD-10-CM | POA: Diagnosis not present

## 2018-08-25 DIAGNOSIS — G20 Parkinson's disease: Secondary | ICD-10-CM | POA: Diagnosis not present

## 2018-08-25 DIAGNOSIS — Z79891 Long term (current) use of opiate analgesic: Secondary | ICD-10-CM | POA: Diagnosis not present

## 2018-08-25 DIAGNOSIS — R1312 Dysphagia, oropharyngeal phase: Secondary | ICD-10-CM | POA: Diagnosis not present

## 2018-08-27 ENCOUNTER — Other Ambulatory Visit: Payer: Self-pay | Admitting: Family Medicine

## 2018-08-28 DIAGNOSIS — Z79891 Long term (current) use of opiate analgesic: Secondary | ICD-10-CM | POA: Diagnosis not present

## 2018-08-28 DIAGNOSIS — E039 Hypothyroidism, unspecified: Secondary | ICD-10-CM | POA: Diagnosis not present

## 2018-08-28 DIAGNOSIS — R1312 Dysphagia, oropharyngeal phase: Secondary | ICD-10-CM | POA: Diagnosis not present

## 2018-08-28 DIAGNOSIS — R413 Other amnesia: Secondary | ICD-10-CM | POA: Diagnosis not present

## 2018-08-28 DIAGNOSIS — R030 Elevated blood-pressure reading, without diagnosis of hypertension: Secondary | ICD-10-CM | POA: Diagnosis not present

## 2018-08-28 DIAGNOSIS — G20 Parkinson's disease: Secondary | ICD-10-CM | POA: Diagnosis not present

## 2018-08-29 DIAGNOSIS — R413 Other amnesia: Secondary | ICD-10-CM | POA: Diagnosis not present

## 2018-08-29 DIAGNOSIS — R030 Elevated blood-pressure reading, without diagnosis of hypertension: Secondary | ICD-10-CM | POA: Diagnosis not present

## 2018-08-29 DIAGNOSIS — E039 Hypothyroidism, unspecified: Secondary | ICD-10-CM | POA: Diagnosis not present

## 2018-08-29 DIAGNOSIS — Z79891 Long term (current) use of opiate analgesic: Secondary | ICD-10-CM | POA: Diagnosis not present

## 2018-08-29 DIAGNOSIS — G20 Parkinson's disease: Secondary | ICD-10-CM | POA: Diagnosis not present

## 2018-08-29 DIAGNOSIS — R1312 Dysphagia, oropharyngeal phase: Secondary | ICD-10-CM | POA: Diagnosis not present

## 2018-09-01 DIAGNOSIS — G20 Parkinson's disease: Secondary | ICD-10-CM | POA: Diagnosis not present

## 2018-09-01 DIAGNOSIS — E039 Hypothyroidism, unspecified: Secondary | ICD-10-CM | POA: Diagnosis not present

## 2018-09-01 DIAGNOSIS — R030 Elevated blood-pressure reading, without diagnosis of hypertension: Secondary | ICD-10-CM | POA: Diagnosis not present

## 2018-09-01 DIAGNOSIS — R1312 Dysphagia, oropharyngeal phase: Secondary | ICD-10-CM | POA: Diagnosis not present

## 2018-09-01 DIAGNOSIS — R413 Other amnesia: Secondary | ICD-10-CM | POA: Diagnosis not present

## 2018-09-01 DIAGNOSIS — Z79891 Long term (current) use of opiate analgesic: Secondary | ICD-10-CM | POA: Diagnosis not present

## 2018-09-05 ENCOUNTER — Telehealth: Payer: Self-pay | Admitting: Family Medicine

## 2018-09-05 DIAGNOSIS — R1312 Dysphagia, oropharyngeal phase: Secondary | ICD-10-CM | POA: Diagnosis not present

## 2018-09-05 DIAGNOSIS — Z79891 Long term (current) use of opiate analgesic: Secondary | ICD-10-CM | POA: Diagnosis not present

## 2018-09-05 DIAGNOSIS — G20 Parkinson's disease: Secondary | ICD-10-CM | POA: Diagnosis not present

## 2018-09-05 DIAGNOSIS — E039 Hypothyroidism, unspecified: Secondary | ICD-10-CM | POA: Diagnosis not present

## 2018-09-05 DIAGNOSIS — R030 Elevated blood-pressure reading, without diagnosis of hypertension: Secondary | ICD-10-CM | POA: Diagnosis not present

## 2018-09-05 DIAGNOSIS — R413 Other amnesia: Secondary | ICD-10-CM | POA: Diagnosis not present

## 2018-09-05 NOTE — Telephone Encounter (Signed)
LM for call back. VM was not private so I could not leave details. If they call back, okay for PEC to give verbal orders per Dr. Birdie Riddle.

## 2018-09-05 NOTE — Telephone Encounter (Signed)
Copied from [place]. Topic: Quick Communication - Home Health Verbal Orders >> Sep 05, 2018  3:19 PM Adelene Idler wrote: Caller/Agency: [facility] Number: [phone] Requesting OT/PT/Skilled Nursing/Social Work: PT Frequency: Move discharge date to 09/09/2018

## 2018-09-05 NOTE — Telephone Encounter (Signed)
Ok for orders? 

## 2018-09-08 NOTE — Telephone Encounter (Signed)
I have attempted to call this number twice today and it continues to go to the VM that is not private.  If they call back, okay for PEC to give verbal orders to [facility], or transfer to flow coordinator.

## 2018-09-10 ENCOUNTER — Other Ambulatory Visit: Payer: Self-pay | Admitting: Family Medicine

## 2018-09-10 DIAGNOSIS — R030 Elevated blood-pressure reading, without diagnosis of hypertension: Secondary | ICD-10-CM | POA: Diagnosis not present

## 2018-09-10 DIAGNOSIS — R413 Other amnesia: Secondary | ICD-10-CM | POA: Diagnosis not present

## 2018-09-10 DIAGNOSIS — R1312 Dysphagia, oropharyngeal phase: Secondary | ICD-10-CM | POA: Diagnosis not present

## 2018-09-10 DIAGNOSIS — G20 Parkinson's disease: Secondary | ICD-10-CM | POA: Diagnosis not present

## 2018-09-10 DIAGNOSIS — R921 Mammographic calcification found on diagnostic imaging of breast: Secondary | ICD-10-CM

## 2018-09-10 DIAGNOSIS — Z79891 Long term (current) use of opiate analgesic: Secondary | ICD-10-CM | POA: Diagnosis not present

## 2018-09-10 DIAGNOSIS — E039 Hypothyroidism, unspecified: Secondary | ICD-10-CM | POA: Diagnosis not present

## 2018-09-11 DIAGNOSIS — G20 Parkinson's disease: Secondary | ICD-10-CM | POA: Diagnosis not present

## 2018-09-11 DIAGNOSIS — R413 Other amnesia: Secondary | ICD-10-CM | POA: Diagnosis not present

## 2018-09-11 DIAGNOSIS — Z79891 Long term (current) use of opiate analgesic: Secondary | ICD-10-CM | POA: Diagnosis not present

## 2018-09-11 DIAGNOSIS — R030 Elevated blood-pressure reading, without diagnosis of hypertension: Secondary | ICD-10-CM | POA: Diagnosis not present

## 2018-09-11 DIAGNOSIS — E039 Hypothyroidism, unspecified: Secondary | ICD-10-CM | POA: Diagnosis not present

## 2018-09-11 DIAGNOSIS — R1312 Dysphagia, oropharyngeal phase: Secondary | ICD-10-CM | POA: Diagnosis not present

## 2018-09-12 DIAGNOSIS — R030 Elevated blood-pressure reading, without diagnosis of hypertension: Secondary | ICD-10-CM | POA: Diagnosis not present

## 2018-09-12 DIAGNOSIS — Z79891 Long term (current) use of opiate analgesic: Secondary | ICD-10-CM | POA: Diagnosis not present

## 2018-09-12 DIAGNOSIS — R1312 Dysphagia, oropharyngeal phase: Secondary | ICD-10-CM | POA: Diagnosis not present

## 2018-09-12 DIAGNOSIS — R413 Other amnesia: Secondary | ICD-10-CM | POA: Diagnosis not present

## 2018-09-12 DIAGNOSIS — G20 Parkinson's disease: Secondary | ICD-10-CM | POA: Diagnosis not present

## 2018-09-12 DIAGNOSIS — E039 Hypothyroidism, unspecified: Secondary | ICD-10-CM | POA: Diagnosis not present

## 2018-09-15 ENCOUNTER — Other Ambulatory Visit: Payer: Self-pay | Admitting: Family Medicine

## 2018-09-15 ENCOUNTER — Other Ambulatory Visit: Payer: Self-pay

## 2018-09-15 DIAGNOSIS — R921 Mammographic calcification found on diagnostic imaging of breast: Secondary | ICD-10-CM

## 2018-09-16 DIAGNOSIS — Z79891 Long term (current) use of opiate analgesic: Secondary | ICD-10-CM | POA: Diagnosis not present

## 2018-09-16 DIAGNOSIS — R413 Other amnesia: Secondary | ICD-10-CM | POA: Diagnosis not present

## 2018-09-16 DIAGNOSIS — R030 Elevated blood-pressure reading, without diagnosis of hypertension: Secondary | ICD-10-CM | POA: Diagnosis not present

## 2018-09-16 DIAGNOSIS — G20 Parkinson's disease: Secondary | ICD-10-CM | POA: Diagnosis not present

## 2018-09-16 DIAGNOSIS — R1312 Dysphagia, oropharyngeal phase: Secondary | ICD-10-CM | POA: Diagnosis not present

## 2018-09-16 DIAGNOSIS — E039 Hypothyroidism, unspecified: Secondary | ICD-10-CM | POA: Diagnosis not present

## 2018-09-19 NOTE — Progress Notes (Signed)
[facility] [address], [place] [address] Phone: [phone] Subjective:    I Melissa Douglas am serving as a Education administrator for Dr. Hulan Saas.   CC: Shoulder pain follow-up  WIO:[id number]  Melissa Douglas is a 77 y.o. female coming in with complaint of shoulder pain. Shoulder is sore again. Wanting to know if she needs an injection.  Patient does have Parkinson's disease and is nearing end-stage.  Having difficulty with even daily activities and is having to rely on other people to help her with transitions.  Patient also has a lung nodule that is continuing to slowly progress and they are monitoring with CT scans.  Patient is losing weight and decreasing the amount of her eating.  Patient is accompanied with her caregiver who does state that she sometimes was complaining of some shoulder pain approximately 2 weeks ago that was fairly severe and seem to hurt her on a regular basis    Past Medical History:  Diagnosis Date  . Cataract Right eye   HAD SURGERY  . Chronic joint pain   . Hx of adenomatous colonic polyps 08/18/2014  . Hypothyroidism   . Multiple nasal polyps   . Osteopenia   . Parkinson disease [facility])    Past Surgical History:  Procedure Laterality Date  . ABDOMINAL HYSTERECTOMY     ovaries remain  . APPENDECTOMY    . CATARACT EXTRACTION    . TONSILLECTOMY     Social History   Socioeconomic History  . Marital status: Married    Spouse name: [name]  . Number of children: 3  . Years of education: College  . Highest education level: Not on file  Occupational History  . Occupation: Retired  Scientific laboratory technician  . Financial resource strain: Not on file  . Food insecurity:    Worry: Not on file    Inability: Not on file  . Transportation needs:    Medical: Not on file    Non-medical: Not on file  Tobacco Use  . Smoking status: Former Smoker    Types: Cigarettes    Last attempt to quit: 01/01/2001    Years since quitting: 17.7    . Smokeless tobacco: Never Used  Substance and Sexual Activity  . Alcohol use: No    Alcohol/week: 12.0 - 14.0 standard drinks    Types: 12 - 14 Glasses of wine per week    Comment: not since being in the facility   . Drug use: No  . Sexual activity: Not on file  Lifestyle  . Physical activity:    Days per week: Not on file    Minutes per session: Not on file  . Stress: Not on file  Relationships  . Social connections:    Talks on phone: Not on file    Gets together: Not on file    Attends religious service: Not on file    Active member of club or organization: Not on file    Attends meetings of clubs or organizations: Not on file    Relationship status: Not on file  Other Topics Concern  . Not on file  Social History Narrative   Patient lives at home with her husband and has a college education.    Caffeine- occasional use   Left-handed.   Allergies  Allergen Reactions  . Codeine Nausea And Vomiting    REACTION: sick  . Ether Other (See Comments)    VERY DIZZY  . Gabapentin Nausea And  Vomiting and Other (See Comments)    REACTION: hallucinations, dizziness and vomiting  . Iodine Hives    HIVES  . Iodinated Diagnostic Agents Rash   No family history on file.  Current Outpatient Medications (Endocrine & Metabolic):  .  levothyroxine (SYNTHROID, LEVOTHROID) 75 MCG tablet, TAKE 1 TABLET(75 MCG) BY MOUTH DAILY   Current Outpatient Medications (Respiratory):  .  cetirizine (ZYRTEC) 10 MG tablet, Take 1 tablet (10 mg total) by mouth daily. .  fluticasone (FLONASE) 50 MCG/ACT nasal spray, Place 2 sprays into both nostrils daily. Marland Kitchen  ipratropium (ATROVENT) 0.06 % nasal spray, Place into the nose.    Current Outpatient Medications (Other):  Marland Kitchen  Carbidopa-Levodopa ER (SINEMET CR) 25-100 MG tablet controlled release, Take 1 tablet 4 (four) times daily by mouth. .  cholecalciferol (VITAMIN D) 1000 units tablet, Take 2,000 Units by mouth daily. .  clotrimazole (LOTRIMIN) 1  % cream, Apply 1 application topically 2 (two) times daily. Marland Kitchen  donepezil (ARICEPT) 5 MG tablet, Take 1 tablet every evening by mouth. .  mirtazapine (REMERON) 30 MG tablet, Take 1 tablet (30 mg total) by mouth at bedtime. Marland Kitchen  Pimavanserin Tartrate (NUPLAZID) 34 MG CAPS, Take 34 mg by mouth.    Past medical history, social, surgical and family history all reviewed in electronic medical record.  No pertanent information unless stated regarding to the chief complaint.   Review of Systems:  No headache, visual changes, nausea, vomiting, diarrhea, constipation, dizziness, abdominal pain, skin rash, fevers, chills, night sweats, weight loss, swollen lymph nodes, , joint swelling, chest pain, shortness of breath, mood changes.  Positive muscle aches, body aches  Objective  Blood pressure 130/74, pulse 71, height 5\' 2"  (1.575 m), SpO2 99 %.    General: Patient is in a wheelchair with masked face using a rolling pill tremor noted of the left upper extremity. HEENT: Pupils equal, extraocular movements intact minorly fixed pupil Respiratory: Patient's speak in full sentences and does not appear short of breath  Cardiovascular: Trace lower extremity edema, non tender, no erythema  Skin: Warm dry intact with no signs of infection or rash on extremities or on axial skeleton.  Abdomen: Soft nontender  Neuro: Cranial nerves II through XII are intact, neurovascularly intact in all extremities but cogwheeling noted Lymph: No lymphadenopathy of posterior or anterior cervical chain or axillae bilaterally.  Gait patient is in a wheelchair MSK:  tender with  cog wheeling     Impression and Recommendations:     The above documentation has been reviewed and is accurate and complete Melissa Pulley, DO       Note: This dictation was prepared with Dragon dictation along with smaller phrase technology. Any transcriptional errors that result from this process are unintentional.

## 2018-09-20 DIAGNOSIS — Z23 Encounter for immunization: Secondary | ICD-10-CM | POA: Diagnosis not present

## 2018-09-22 ENCOUNTER — Encounter: Payer: Self-pay | Admitting: Family Medicine

## 2018-09-22 ENCOUNTER — Ambulatory Visit (INDEPENDENT_AMBULATORY_CARE_PROVIDER_SITE_OTHER): Payer: Medicare Other | Admitting: Family Medicine

## 2018-09-22 DIAGNOSIS — M19012 Primary osteoarthritis, left shoulder: Secondary | ICD-10-CM | POA: Diagnosis not present

## 2018-09-22 DIAGNOSIS — M19011 Primary osteoarthritis, right shoulder: Secondary | ICD-10-CM

## 2018-09-22 DIAGNOSIS — G20 Parkinson's disease: Secondary | ICD-10-CM | POA: Diagnosis not present

## 2018-09-22 NOTE — Patient Instructions (Signed)
Good to see you  Ice is your friend pennsaid pinkie amount topically 2 times daily as needed.  Call [phone] when you do mneed me COntinue to work on range of motion

## 2018-09-22 NOTE — Assessment & Plan Note (Signed)
Likely contributing to some of the aches and pains of the cogwheeling and the muscle tightness.  Discussed icing regimen and home exercises.  Follow-up again in 4 to 8 weeks

## 2018-09-22 NOTE — Assessment & Plan Note (Signed)
Discussed icing regimen and home exercises.  Topical anti-inflammatories given.  Could do injections but patient has declined this at the moment because she feels like she is doing relatively well.  Follow-up again in 4 to 8 weeks

## 2018-09-24 ENCOUNTER — Ambulatory Visit
Admission: RE | Admit: 2018-09-24 | Discharge: 2018-09-24 | Disposition: A | Discharge status: Home / Self Care | Payer: Medicare Other | Source: Ambulatory Visit | Attending: Family Medicine | Admitting: Family Medicine

## 2018-09-24 DIAGNOSIS — R921 Mammographic calcification found on diagnostic imaging of breast: Secondary | ICD-10-CM | POA: Diagnosis not present

## 2018-09-25 ENCOUNTER — Encounter: Payer: Self-pay | Admitting: General Practice

## 2018-10-21 DIAGNOSIS — G248 Other dystonia: Secondary | ICD-10-CM | POA: Diagnosis not present

## 2018-10-21 DIAGNOSIS — F0280 Dementia in other diseases classified elsewhere without behavioral disturbance: Secondary | ICD-10-CM | POA: Diagnosis not present

## 2018-10-21 DIAGNOSIS — G20 Parkinson's disease: Secondary | ICD-10-CM | POA: Diagnosis not present

## 2018-10-21 DIAGNOSIS — Z8673 Personal history of transient ischemic attack (TIA), and cerebral infarction without residual deficits: Secondary | ICD-10-CM | POA: Diagnosis not present

## 2018-10-30 ENCOUNTER — Ambulatory Visit (INDEPENDENT_AMBULATORY_CARE_PROVIDER_SITE_OTHER): Payer: Medicare Other | Admitting: Family Medicine

## 2018-10-30 ENCOUNTER — Encounter: Payer: Self-pay | Admitting: Family Medicine

## 2018-10-30 ENCOUNTER — Ambulatory Visit: Payer: Self-pay | Admitting: *Deleted

## 2018-10-30 VITALS — BP 128/70 | HR 71 | Ht 62.0 in

## 2018-10-30 DIAGNOSIS — S29011A Strain of muscle and tendon of front wall of thorax, initial encounter: Secondary | ICD-10-CM

## 2018-10-30 NOTE — Progress Notes (Signed)
Melissa Douglas is a 77 y.o. female  No chief complaint on file.   HPI: Melissa Douglas is a 77 y.o. female who is a patient of Dr. Birdie Riddle and presents to the office today with her husband. She is in a wheelchair.  Pt complains of pain beneath her Lt breast that is intermittent x 1 week. Pain lasts a few min then "fades away". She describes pain as sharp but also achy and sore. Pain worse with palpation, cough. No SOB. No CP. No n/v/d and no abdominal pain. She has issues intermittently with constipation but this is not new.  Husband states she coughs every night d/t PND for which she is on zyrtec and flonase. He thinks she may be coughing more in the past week or two. Husband also notes pt has to get out of bed at night to use bathroom so he is turning her and helping to lift/pull her up and out of bed. No fever, chills.   Past Medical History:  Diagnosis Date  . Cataract Right eye   HAD SURGERY  . Chronic joint pain   . Hx of adenomatous colonic polyps 08/18/2014  . Hypothyroidism   . Multiple nasal polyps   . Osteopenia   . Parkinson disease [facility])     Past Surgical History:  Procedure Laterality Date  . ABDOMINAL HYSTERECTOMY     ovaries remain  . APPENDECTOMY    . CATARACT EXTRACTION    . TONSILLECTOMY      Social History   Socioeconomic History  . Marital status: Married    Spouse name: [name]  . Number of children: 3  . Years of education: College  . Highest education level: Not on file  Occupational History  . Occupation: Retired  Scientific laboratory technician  . Financial resource strain: Not on file  . Food insecurity:    Worry: Not on file    Inability: Not on file  . Transportation needs:    Medical: Not on file    Non-medical: Not on file  Tobacco Use  . Smoking status: Former Smoker    Types: Cigarettes    Last attempt to quit: 01/01/2001    Years since quitting: 17.8  . Smokeless tobacco: Never Used  Substance and Sexual Activity  . Alcohol use: No   Alcohol/week: 12.0 - 14.0 standard drinks    Types: 12 - 14 Glasses of wine per week    Comment: not since being in the facility   . Drug use: No  . Sexual activity: Not on file  Lifestyle  . Physical activity:    Days per week: Not on file    Minutes per session: Not on file  . Stress: Not on file  Relationships  . Social connections:    Talks on phone: Not on file    Gets together: Not on file    Attends religious service: Not on file    Active member of club or organization: Not on file    Attends meetings of clubs or organizations: Not on file    Relationship status: Not on file  . Intimate partner violence:    Fear of current or ex partner: Not on file    Emotionally abused: Not on file    Physically abused: Not on file    Forced sexual activity: Not on file  Other Topics Concern  . Not on file  Social History Narrative   Patient lives at home with her husband and has a college  education.    Caffeine- occasional use   Left-handed.    History reviewed. No pertinent family history.   Immunization History  Administered Date(s) Administered  . Influenza, High Dose Seasonal PF 09/14/2016, 10/11/2017  . Influenza,inj,Quad PF,6+ Mos 12/12/2015  . Influenza-Unspecified 10/12/2013, 08/29/2014, 09/20/2018  . Pneumococcal Conjugate-13 02/09/2016  . Pneumococcal Polysaccharide-23 02/08/2013    Outpatient Encounter Medications as of 10/30/2018  Medication Sig  . Carbidopa-Levodopa ER (SINEMET CR) 25-100 MG tablet controlled release Take 1 tablet 4 (four) times daily by mouth.  . cetirizine (ZYRTEC) 10 MG tablet Take 1 tablet (10 mg total) by mouth daily.  . cholecalciferol (VITAMIN D) 1000 units tablet Take 2,000 Units by mouth daily.  . clotrimazole (LOTRIMIN) 1 % cream Apply 1 application topically 2 (two) times daily.  Marland Kitchen donepezil (ARICEPT) 5 MG tablet Take 1 tablet every evening by mouth.  . fluticasone (FLONASE) 50 MCG/ACT nasal spray Place 2 sprays into both nostrils  daily.  Marland Kitchen ipratropium (ATROVENT) 0.06 % nasal spray Place into the nose.  . levothyroxine (SYNTHROID, LEVOTHROID) 75 MCG tablet TAKE 1 TABLET(75 MCG) BY MOUTH DAILY  . mirtazapine (REMERON) 30 MG tablet Take 1 tablet (30 mg total) by mouth at bedtime.  Debby Freiberg Tartrate (NUPLAZID) 34 MG CAPS Take 34 mg by mouth.   No facility-administered encounter medications on file as of 10/30/2018.      ROS: Pertinent positives and negatives noted in HPI. Remainder non-contributory.   Allergies  Allergen Reactions  . Codeine Nausea And Vomiting    REACTION: sick  . Ether Other (See Comments)    VERY DIZZY  . Gabapentin Nausea And Vomiting and Other (See Comments)    REACTION: hallucinations, dizziness and vomiting  . Iodine Hives    HIVES  . Iodinated Diagnostic Agents Rash    BP 128/70   Pulse 71   Ht 5\' 2"  (1.575 m)   SpO2 96%   BMI 20.30 kg/m   Physical Exam  Constitutional: She appears well-developed. No distress.  Cardiovascular: Normal rate and regular rhythm. PMI is not displaced.  Pulmonary/Chest: Breath sounds normal. No respiratory distress. She has no wheezes. She has no rhonchi.    Abdominal: Soft. Bowel sounds are normal. She exhibits no distension. There is no abdominal tenderness.     A/P:  1. Muscle strain of chest wall, initial encounter - heating pad 2-3x/day for 15 min  - ibuprofen 600mg  BID-TID with food x 1 week then PRN - f/u if symptoms worsen or do not improve in 2-3 wks Discussed plan and reviewed medications with patient, including risks, benefits, and potential side effects. Pt expressed understand. All questions answered.   I spent 25 min with the patient today and greater than 50% was spent in counseling, coordination of care, education

## 2018-10-30 NOTE — Telephone Encounter (Signed)
Agent is calling to make sure patient can be scheduled at office- husband is requesting appointment at [facility] office- it is close to home and PCP does not have opening today. Husband had thought that he may have pulled a muscle transferring patient- she is in wheelchair. Patient has been having pain is bottom of left rib/abdominal area. Patient complains of pain in lower rib area-comes and goes at different times- intermittent sharp pain that does not last long. Patient does suffer from constipation-last BM- yesterday morning. Patient is also having some pain on bottom - she has bed sores from sitting in wheel chair.  Reason for Disposition . [1] MODERATE pain (e.g., interferes with normal activities) AND [2] comes and goes (cramps) AND [3] present > 24 hours  (Exception: pain with Vomiting or Diarrhea - see that Guideline)  Answer Assessment - Initial Assessment Questions 1. LOCATION: "Where does it hurt?"      Upper abdominal near bottom of rib 2. RADIATION: "Does the pain shoot anywhere else?" (e.g., chest, back)     no 3. ONSET: "When did the pain begin?" (e.g., minutes, hours or days ago)      Started this week- last week end 4. SUDDEN: "Gradual or sudden onset?"     sudden 5. PATTERN "Does the pain come and go, or is it constant?"    - If constant: "Is it getting better, staying the same, or worsening?"      (Note: Constant means the pain never goes away completely; most serious pain is constant and it progresses)     - If intermittent: "How long does it last?" "Do you have pain now?"     (Note: Intermittent means the pain goes away completely between bouts)     Comes and goes- sharp pain 6. SEVERITY: "How bad is the pain?"  (e.g., Scale 1-10; mild, moderate, or severe)    - MILD (1-3): doesn't interfere with normal activities, abdomen soft and not tender to touch     - MODERATE (4-7): interferes with normal activities or awakens from sleep, tender to touch     - SEVERE (8-10):  excruciating pain, doubled over, unable to do any normal activities       moderate 7. RECURRENT SYMPTOM: "Have you ever had this type of abdominal pain before?" If so, ask: "When was the last time?" and "What happened that time?"      no 8. AGGRAVATING FACTORS: "Does anything seem to cause this pain?" (e.g., foods, stress, alcohol)     Normally while patient is sitting- not during moving patient 9. CARDIAC SYMPTOMS: "Do you have any of the following symptoms: chest pain, difficulty breathing, sweating, nausea?"     No- patient has heavy drainage- she has parkinson  10. OTHER SYMPTOMS: "Do you have any other symptoms?" (e.g., fever, vomiting, diarrhea)       Constipation from medication 11. PREGNANCY: "Is there any chance you are pregnant?" "When was your last menstrual period?"       n/a  Protocols used: ABDOMINAL PAIN - UPPER-A-AH

## 2018-10-30 NOTE — Patient Instructions (Signed)
Heating pad 2-3x/day for 15 min Ibuprofen 600mg  every 6-8 hrs as needed - try taking twice per day with food for 1 week then as needed

## 2018-11-16 ENCOUNTER — Encounter (HOSPITAL_COMMUNITY): Payer: Self-pay

## 2018-11-16 ENCOUNTER — Emergency Department (HOSPITAL_COMMUNITY)
Admission: EM | Admit: 2018-11-16 | Discharge: 2018-11-16 | Disposition: A | Discharge status: Home / Self Care | Payer: Medicare Other | Attending: Emergency Medicine | Admitting: Emergency Medicine

## 2018-11-16 ENCOUNTER — Emergency Department (HOSPITAL_COMMUNITY): Payer: Medicare Other

## 2018-11-16 ENCOUNTER — Other Ambulatory Visit: Payer: Self-pay

## 2018-11-16 DIAGNOSIS — Z87891 Personal history of nicotine dependence: Secondary | ICD-10-CM | POA: Diagnosis not present

## 2018-11-16 DIAGNOSIS — E039 Hypothyroidism, unspecified: Secondary | ICD-10-CM | POA: Insufficient documentation

## 2018-11-16 DIAGNOSIS — R05 Cough: Secondary | ICD-10-CM | POA: Diagnosis not present

## 2018-11-16 DIAGNOSIS — R531 Weakness: Secondary | ICD-10-CM | POA: Diagnosis not present

## 2018-11-16 DIAGNOSIS — R911 Solitary pulmonary nodule: Secondary | ICD-10-CM | POA: Diagnosis not present

## 2018-11-16 DIAGNOSIS — R42 Dizziness and giddiness: Secondary | ICD-10-CM | POA: Diagnosis not present

## 2018-11-16 DIAGNOSIS — I10 Essential (primary) hypertension: Secondary | ICD-10-CM | POA: Diagnosis not present

## 2018-11-16 DIAGNOSIS — Z79899 Other long term (current) drug therapy: Secondary | ICD-10-CM | POA: Insufficient documentation

## 2018-11-16 LAB — URINALYSIS, ROUTINE W REFLEX MICROSCOPIC
Bacteria, UA: NONE SEEN
Bilirubin Urine: NEGATIVE
Glucose, UA: NEGATIVE mg/dL
Ketones, ur: NEGATIVE mg/dL
Leukocytes, UA: NEGATIVE
Nitrite: NEGATIVE
Protein, ur: NEGATIVE mg/dL
SPECIFIC GRAVITY, URINE: 1.005 (ref 1.005–1.030)
pH: 7 (ref 5.0–8.0)

## 2018-11-16 LAB — BASIC METABOLIC PANEL
Anion gap: 9 (ref 5–15)
BUN: 14 mg/dL (ref 8–23)
CALCIUM: 9.6 mg/dL (ref 8.9–10.3)
CO2: 25 mmol/L (ref 22–32)
Chloride: 106 mmol/L (ref 98–111)
Creatinine, Ser: 0.58 mg/dL (ref 0.44–1.00)
GFR calc Af Amer: >60 mL/min (ref >60)
GFR calc non Af Amer: >60 mL/min (ref >60)
Glucose, Bld: 85 mg/dL (ref 70–99)
Potassium: 3.6 mmol/L (ref 3.5–5.1)
SODIUM: 140 mmol/L (ref 135–145)

## 2018-11-16 LAB — CBC WITH DIFFERENTIAL/PLATELET
Abs Immature Granulocytes: 0 10*3/uL (ref 0.00–0.07)
Basophils Absolute: 0 10*3/uL (ref 0.0–0.1)
Basophils Relative: 1 %
EOS ABS: 0.1 10*3/uL (ref 0.0–0.5)
Eosinophils Relative: 2 %
HCT: 39 % (ref 36.0–46.0)
Hemoglobin: 12 g/dL (ref 12.0–15.0)
Immature Granulocytes: 0 %
Lymphocytes Relative: 29 %
Lymphs Abs: 1.7 10*3/uL (ref 0.7–4.0)
MCH: 27.1 pg (ref 26.0–34.0)
MCHC: 30.8 g/dL (ref 30.0–36.0)
MCV: 88 fL (ref 80.0–100.0)
Monocytes Absolute: 0.4 10*3/uL (ref 0.1–1.0)
Monocytes Relative: 7 %
Neutro Abs: 3.6 10*3/uL (ref 1.7–7.7)
Neutrophils Relative %: 61 %
Platelets: 239 10*3/uL (ref 150–400)
RBC: 4.43 MIL/uL (ref 3.87–5.11)
RDW: 12.4 % (ref 11.5–15.5)
WBC: 5.9 10*3/uL (ref 4.0–10.5)
nRBC: 0 % (ref 0.0–0.2)

## 2018-11-16 LAB — TSH: TSH: 2.468 u[IU]/mL (ref 0.350–4.500)

## 2018-11-16 LAB — AMMONIA: Ammonia: <9 umol/L — ABNORMAL LOW (ref 9–35)

## 2018-11-16 LAB — HEPATIC FUNCTION PANEL
ALT: 8 U/L (ref 0–44)
AST: 12 U/L — ABNORMAL LOW (ref 15–41)
Albumin: 3.6 g/dL (ref 3.5–5.0)
Alkaline Phosphatase: 54 U/L (ref 38–126)
Bilirubin, Direct: <0.1 mg/dL (ref 0.0–0.2)
Total Bilirubin: 0.6 mg/dL (ref 0.3–1.2)
Total Protein: 6.3 g/dL — ABNORMAL LOW (ref 6.5–8.1)

## 2018-11-16 LAB — TROPONIN I

## 2018-11-16 MED ORDER — LACTULOSE 10 GM/15ML PO SOLN: 20.0000 g | Freq: Once | ORAL | Status: DC

## 2018-11-16 MED ORDER — SODIUM CHLORIDE 0.9 % IV BOLUS
500.0000 mL | Freq: Once | INTRAVENOUS | Status: AC
  Administered 2018-11-16: 500 mL via INTRAVENOUS

## 2018-11-16 NOTE — ED Notes (Signed)
X-ray at bedside

## 2018-11-16 NOTE — ED Notes (Signed)
Per lab ammonia misread and is less than 9

## 2018-11-16 NOTE — ED Triage Notes (Signed)
Pt coming from home via [facility], per family pt has been more weak/lethargic than normal starting this morning (unknown time). Pt has parkinsons and is alert and oriented x2 at baseline. Pt family states pt has been slower to respond and not moving as much. Per ems, pt has a prod cough at baseline. Pt denies pain. VSS.

## 2018-11-16 NOTE — ED Notes (Signed)
ED Provider at bedside. 

## 2018-11-16 NOTE — ED Notes (Signed)
Patient verbalizes understanding of discharge instructions. Opportunity for questioning and answers were provided. Armband removed by staff, pt discharged from ED. RN offered to set up [facility] transport, family denied and requested they take pt home. RN got pt dressed and into wheelchair.

## 2018-11-16 NOTE — ED Provider Notes (Signed)
[place] EMERGENCY DEPARTMENT Provider Note   CSN: [id number] Arrival date & time: 11/16/18  1222     History   Chief Complaint Chief Complaint  Patient presents with  . Weakness  . Fatigue    HPI Melissa Douglas is a 78 y.o. female.  HPI   Melissa Douglas is a 78 y.o. female, with a history of Parkinsons, presenting to the ED with generalized weakness beginning this morning.  Patient states she woke up and "felt weak all over."  Associated with occasional lightheadedness and a cough but cannot say how long she has had the cough.  Denies fever, falls/trauma, focal weakness, chest pain, shortness of breath, abdominal pain, N/V/D, hematochezia/melena, syncope, or any other complaints.     Past Medical History:  Diagnosis Date  . Cataract Right eye   HAD SURGERY  . Chronic joint pain   . Hx of adenomatous colonic polyps 08/18/2014  . Hypothyroidism   . Multiple nasal polyps   . Osteopenia   . Parkinson disease [facility])     Patient Active Problem List   Diagnosis Date Noted  . 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 Memory loss 12/13/2014  . Soft tissue mass 12/13/2014  . Recent skin changes 12/13/2014  . Hx of adenomatous colonic polyps 08/18/2014  . Fungal dermatitis 03/19/2014  . Overweight 03/19/2014  . Cataract   . Constipation, slow transit 09/03/2012  . Seasonal allergic rhinitis 04/18/2012  . General medical examination 05/18/2011  . Elevated BP 03/13/2011  . Vaginal dryness, menopausal 03/13/2011  . Vitamin D deficiency 03/13/2011  . Hypertriglyceridemia 03/13/2011  . OSTEOPENIA 05/31/2010  . Parkinsons disease ([place]) 04/07/2010  . POSTMENOPAUSAL STATUS 04/07/2010  . 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 DRY MOUTH 03/07/2010  . 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 PARESTHESIA 01/26/2009  . Hypothyroidism 10/20/2008  . 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 ABDOMINAL  HYSTERECTOMY     ovaries remain  . APPENDECTOMY    . CATARACT EXTRACTION    . TONSILLECTOMY       OB History   No obstetric history on file.      Home Medications    Prior to Admission medications   Medication Sig Start Date End Date Taking? Authorizing Provider  Carbidopa-Levodopa ER (SINEMET CR) 25-100 MG tablet controlled release Take 1 tablet 4 (four) times daily by mouth. 09/02/17   [provider]  cetirizine (ZYRTEC) 10 MG tablet Take 1 tablet (10 mg total) by mouth daily. 07/09/18   Midge Minium, MD  cholecalciferol (VITAMIN D) 1000 units tablet Take 2,000 Units by mouth daily.    [provider]  clotrimazole (LOTRIMIN) 1 % cream Apply 1 application topically 2 (two) times daily.    [provider]  donepezil (ARICEPT) 5 MG tablet Take 5 mg by mouth every evening.  09/20/17   [provider]  fluticasone (FLONASE) 50 MCG/ACT nasal spray Place 2 sprays into both nostrils daily. 07/09/18   Midge Minium, MD  ipratropium (ATROVENT) 0.06 % nasal spray Place 1 spray into the nose daily.  03/05/17   [provider]  levothyroxine (SYNTHROID, LEVOTHROID) 75 MCG tablet TAKE 1 TABLET(75 MCG) BY MOUTH DAILY Patient taking differently: Take 75 mcg by mouth daily before breakfast.  08/25/18   Midge Minium, MD  mirtazapine (REMERON) 30 MG tablet Take 1 tablet (30 mg total) by mouth at bedtime. 11/21/17   Wilfred Lacy  Lum, NP  Pimavanserin Tartrate (NUPLAZID) 34 MG CAPS Take 34 mg by mouth.     [provider]    Family History No family history on file.  Social History Social History   Tobacco Use  . Smoking status: Former Smoker    Types: Cigarettes    Last attempt to quit: 01/01/2001    Years since quitting: 17.8  . Smokeless tobacco: Never Used  Substance Use Topics  . Alcohol use: No    Alcohol/week: 12.0 - 14.0 standard drinks    Types: 12 - 14 Glasses of wine per week    Comment: not since being in  the facility   . Drug use: No     Allergies   Codeine; Ether; Gabapentin; Iodine; and Iodinated diagnostic agents   Review of Systems Review of Systems  Constitutional: Negative for chills, diaphoresis and fever.  HENT: Negative for congestion.   Respiratory: Positive for cough. Negative for shortness of breath.   Cardiovascular: Negative for chest pain.  Gastrointestinal: Negative for abdominal pain, diarrhea, nausea and vomiting.  Genitourinary: Negative for dysuria, flank pain, frequency and hematuria.  Musculoskeletal: Negative for back pain and neck pain.  Neurological: Positive for weakness (generalized) and light-headedness. Negative for numbness and headaches.  All other systems reviewed and are negative.    Physical Exam Updated Vital Signs Pulse 71   Temp 98.4 F (36.9 C) (Oral)   Resp 12   Ht 5\' 2"  (1.575 m)   SpO2 97%   BMI 20.30 kg/m   Physical Exam Vitals signs and nursing note reviewed.  Constitutional:      General: She is not in acute distress.    Appearance: She is well-developed. She is not diaphoretic.  HENT:     Head: Normocephalic and atraumatic.     Mouth/Throat:     Mouth: Mucous membranes are moist.     Pharynx: Oropharynx is clear.  Eyes:     Extraocular Movements: Extraocular movements intact.     Conjunctiva/sclera: Conjunctivae normal.     Pupils: Pupils are equal, round, and reactive to light.  Neck:     Musculoskeletal: Neck supple.  Cardiovascular:     Rate and Rhythm: Normal rate and regular rhythm.     Pulses: Normal pulses.     Heart sounds: Normal heart sounds.  Pulmonary:     Effort: Pulmonary effort is normal. No respiratory distress.     Breath sounds: Normal breath sounds.  Abdominal:     Palpations: Abdomen is soft.     Tenderness: There is no abdominal tenderness. There is no guarding.  Musculoskeletal:     Right lower leg: No edema.     Left lower leg: No edema.  Lymphadenopathy:     Cervical: No cervical  adenopathy.  Skin:    General: Skin is warm and dry.  Neurological:     General: No focal deficit present.     Mental Status: She is alert.     Comments: Sensation grossly intact to light touch in the extremities. Strength 4/5 in all extremities. Cranial nerves III-XII grossly intact. No facial droop.   Psychiatric:        Behavior: Behavior normal.      ED Treatments / Results  Labs (all labs ordered are listed, but only abnormal results are displayed) Labs Reviewed  URINALYSIS, ROUTINE W REFLEX MICROSCOPIC - Abnormal; Notable for the following components:      Result Value   Color, Urine COLORLESS (*)  Hgb urine dipstick SMALL (*)    All other components within normal limits  BASIC METABOLIC PANEL  CBC WITH DIFFERENTIAL/PLATELET  TROPONIN I  HEPATIC FUNCTION PANEL  AMMONIA  TSH    EKG EKG Interpretation  Date/Time:  Sunday November 16 2018 13:56:04 EST Ventricular Rate:  69 PR Interval:    QRS Duration: 84 QT Interval:  412 QTC Calculation: 442 R Axis:   73 Text Interpretation:  Sinus rhythm Short PR interval No significant change since last tracing Confirmed by Blanchie Dessert ([id number]) on 11/16/2018 2:35:28 PM   Radiology Dg Chest Portable 1 View  Result Date: 11/16/2018 CLINICAL DATA:  78 y/o F; increasing weakness and lethargy. Chronic cough. EXAM: PORTABLE CHEST 1 VIEW COMPARISON:  08/20/2018 CT chest.  04/28/2018 chest radiograph. FINDINGS: Normal cardiac silhouette. Aortic atherosclerosis with calcification. Right upper lobe pulmonary nodule poorly visualized, better characterized on CT. No focal consolidation. No pleural effusion or pneumothorax. No acute osseous abnormality is evident. IMPRESSION: No active disease. Right upper lobe pulmonary nodule is better characterized on CT. Electronically Signed   By: Kristine Garbe M.D.   On: 11/16/2018 14:16    Procedures Procedures (including critical care time)  Medications Ordered in ED Medications    sodium chloride 0.9 % bolus 500 mL (0 mLs Intravenous Stopped 11/16/18 1439)     Initial Impression / Assessment and Plan / ED Course  I have reviewed the triage vital signs and the nursing notes.  Pertinent labs & imaging results that were available during my care of the patient were reviewed by me and considered in my medical decision making (see chart for details).  Clinical Course as of Nov 16 1633  Sun Nov 16, 2018  1545 Spoke with the patient's husband who was not previously available during initial interview. States his concern was that patient was having difficulty speaking and was not wanting to get out of bed, which is unusual for her.  He states the speech abnormalities have resolved, patient just seems more tired than normal.   [SJ]    Clinical Course User Index [SJ] Joy, Shawn C, PA-C   Patient presents with generalized weakness and malaise.  Patient at her baseline mental status here in the ED, per her husband.  BMP, CBC, and UA overall reassuring. End of shift patient care handoff report given to [facility], PA-C. Plan: Hepatic function panel, TSH, and ammonia pending.  If no concerning findings and patient and her husband have no additional concerns, patient may be discharged with PCP vs neurology follow up.  Findings and plan of care discussed with Blanchie Dessert, MD. Dr. Maryan Rued personally evaluated and examined this patient.  Vitals:   11/16/18 1445 11/16/18 1500 11/16/18 1600 11/16/18 1615  BP: 125/65 134/77 (!) 145/95 (!) 163/63  Pulse: 70 68 70 70  Resp: (!) 22 12 16 11   Temp:      TempSrc:      SpO2: 97% 100% 100% 100%  Height:         Final Clinical Impressions(s) / ED Diagnoses   Final diagnoses:  Generalized weakness    ED Discharge Orders    None       Layla Maw 11/16/18 1635    Blanchie Dessert, MD 11/17/18 2208

## 2018-11-16 NOTE — Discharge Instructions (Signed)
Your lab work and imaging today were reassuring.  Drink plenty of water and get plenty of rest.  Continue to take your home medicines as prescribed.  Follow-up with your primary care doctor and neurology for reevaluation of your symptoms.  Return to the emergency department if any concerning signs or symptoms develop such as fevers, persistently altered mental status, or worsening weakness.

## 2018-11-16 NOTE — ED Provider Notes (Signed)
Received patient at signout from [facility].  Refer to provider note for full history and physical examination.  Briefly, patient is a 78 year old female with history of Parkinson's disease presenting for evaluation of difficulty talking and generalized weakness.  She is found to be much improved while in the ED.  Awaiting ammonia, TSH, and LFTs.  If lab work reassuring, stable for discharge home with follow-up with PCP and neurology.    ED Course/Procedures   Clinical Course as of Nov 16 1733  Sun Nov 16, 2018  1545 Spoke with the patient's husband who was not previously available during initial interview. States his concern was that patient was having difficulty speaking and was not wanting to get out of bed, which is unusual for her.  He states the speech abnormalities have resolved, patient just seems more tired than normal.   [SJ]    Clinical Course User Index [SJ] Joy, Shawn C, PA-C    Procedures  MDM  Lab work unremarkable.I have spoken with the patient's husband who states that she "looks a lot better than she did this morning.  She just seems tired ".  He does note that she only drinks approximately 4 glasses of water daily, if that.  I encouraged fluid rehydration at home and follow-up with PCP and neurology.  Discussed strict ED return precautions.  Patient's husband verbalized understanding of and agreement with plan and patient stable for discharge home at this time.     Renita Papa, PA-C 11/16/18 1737    Dorie Rank, MD 11/16/18 2148

## 2018-11-18 ENCOUNTER — Encounter: Payer: Self-pay | Admitting: Family Medicine

## 2018-11-18 ENCOUNTER — Other Ambulatory Visit: Payer: Self-pay | Admitting: Family Medicine

## 2018-11-19 ENCOUNTER — Encounter: Payer: Self-pay | Admitting: Family Medicine

## 2018-11-19 ENCOUNTER — Ambulatory Visit (INDEPENDENT_AMBULATORY_CARE_PROVIDER_SITE_OTHER): Payer: Medicare Other | Admitting: Family Medicine

## 2018-11-19 ENCOUNTER — Other Ambulatory Visit: Payer: Self-pay

## 2018-11-19 DIAGNOSIS — R319 Hematuria, unspecified: Secondary | ICD-10-CM

## 2018-11-19 DIAGNOSIS — R1319 Other dysphagia: Secondary | ICD-10-CM | POA: Insufficient documentation

## 2018-11-19 DIAGNOSIS — G20 Parkinson's disease: Secondary | ICD-10-CM

## 2018-11-19 NOTE — Patient Instructions (Signed)
Follow up as needed or as scheduled INCREASE the nasal spray to twice daily CALL and schedule a follow up w/ [facility] b/c I think a swallow study is needed Call with any questions or concerns Hang in there!!!

## 2018-11-19 NOTE — Progress Notes (Signed)
   Subjective:    Patient ID: Melissa Douglas, female    DOB: 01-26-1941, 78 y.o.   MRN: [id number]  HPI ER f/u- pt was seen on 1/5 w/ dehydration.  Labs were normal and pt improved after IV fluids.  Husband reports pt will be 'strong in the morning and loses it quickly'.  Ongoing hoarseness despite use of allergy medications and nasal sprays.  Now having trouble swallowing.  Pt reports difficulty eating or drinking anything other than plain water.  Pt reports pain w/ swallowing.    Hematuria- pt had large Hgb and >50 RBC in urine at ER.  Husband is not interested in repeat UA at this time.     Review of Systems For ROS see HPI     Objective:   Physical Exam Vitals signs reviewed.  Constitutional:      General: She is not in acute distress.    Appearance: She is well-developed.  HENT:     Head: Normocephalic and atraumatic.     Right Ear: Tympanic membrane normal.     Left Ear: Tympanic membrane normal.     Nose: Mucosal edema and rhinorrhea present.     Right Sinus: No maxillary sinus tenderness or frontal sinus tenderness.     Left Sinus: No maxillary sinus tenderness or frontal sinus tenderness.     Mouth/Throat:     Pharynx: Posterior oropharyngeal erythema (w/ PND) present.  Eyes:     Conjunctiva/sclera: Conjunctivae normal.     Pupils: Pupils are equal, round, and reactive to light.  Neck:     Musculoskeletal: Normal range of motion and neck supple.  Cardiovascular:     Rate and Rhythm: Normal rate and regular rhythm.     Heart sounds: Normal heart sounds.  Pulmonary:     Effort: Pulmonary effort is normal. No respiratory distress.     Breath sounds: Normal breath sounds. No wheezing or rales.  Lymphadenopathy:     Cervical: No cervical adenopathy.  Neurological:     Comments: Sitting in wheel chair, masked facies Unable to answer questions directed to her           Assessment & Plan:

## 2018-11-19 NOTE — Assessment & Plan Note (Signed)
Ongoing issue for pt.  I suspect that her increased fatigue and her difficulty swallowing are both directly related to disease progression.  I strongly encouraged husband to get swallowing evaluation.  We discussed that I could place this order or she could do this through the neuro team at [facility].  He prefers to keep her care aligned w/ the neuro team at [facility] and he will call them to discuss.

## 2018-11-19 NOTE — Assessment & Plan Note (Signed)
New.  Pt reports painful and difficult swallowing w/ anything other than water.  I suspect this is Parkinson's related.  See below.  Swallow study needed.

## 2018-11-19 NOTE — Assessment & Plan Note (Signed)
New.  Noted on UA done in ER.  No culture sent.  Husband declines repeat UA bc 'she's getting better'.  He reports he will monitor this situation at home and bring her in if anything changes.

## 2018-12-05 ENCOUNTER — Encounter: Payer: Self-pay | Admitting: Family Medicine

## 2018-12-16 DIAGNOSIS — R1319 Other dysphagia: Secondary | ICD-10-CM | POA: Diagnosis not present

## 2018-12-16 DIAGNOSIS — R1312 Dysphagia, oropharyngeal phase: Secondary | ICD-10-CM | POA: Diagnosis not present

## 2018-12-16 DIAGNOSIS — R633 Feeding difficulties: Secondary | ICD-10-CM | POA: Diagnosis not present

## 2018-12-24 DIAGNOSIS — R1312 Dysphagia, oropharyngeal phase: Secondary | ICD-10-CM | POA: Diagnosis not present

## 2019-01-05 DIAGNOSIS — R918 Other nonspecific abnormal finding of lung field: Secondary | ICD-10-CM | POA: Diagnosis not present

## 2019-01-05 DIAGNOSIS — R911 Solitary pulmonary nodule: Secondary | ICD-10-CM | POA: Diagnosis not present

## 2019-01-08 DIAGNOSIS — Z91041 Radiographic dye allergy status: Secondary | ICD-10-CM | POA: Diagnosis not present

## 2019-01-08 DIAGNOSIS — R1312 Dysphagia, oropharyngeal phase: Secondary | ICD-10-CM | POA: Diagnosis not present

## 2019-01-08 DIAGNOSIS — F0280 Dementia in other diseases classified elsewhere without behavioral disturbance: Secondary | ICD-10-CM | POA: Diagnosis not present

## 2019-01-08 DIAGNOSIS — Z87891 Personal history of nicotine dependence: Secondary | ICD-10-CM | POA: Diagnosis not present

## 2019-01-08 DIAGNOSIS — R911 Solitary pulmonary nodule: Secondary | ICD-10-CM | POA: Diagnosis not present

## 2019-01-08 DIAGNOSIS — Z888 Allergy status to other drugs, medicaments and biological substances status: Secondary | ICD-10-CM | POA: Diagnosis not present

## 2019-01-08 DIAGNOSIS — G20 Parkinson's disease: Secondary | ICD-10-CM | POA: Diagnosis not present

## 2019-01-08 DIAGNOSIS — Z885 Allergy status to narcotic agent status: Secondary | ICD-10-CM | POA: Diagnosis not present

## 2019-01-12 ENCOUNTER — Encounter: Payer: Self-pay | Admitting: Nurse Practitioner

## 2019-01-12 ENCOUNTER — Ambulatory Visit (INDEPENDENT_AMBULATORY_CARE_PROVIDER_SITE_OTHER): Payer: Medicare Other | Admitting: Nurse Practitioner

## 2019-01-12 VITALS — BP 118/52 | HR 78 | Temp 98.1°F | Ht 62.0 in

## 2019-01-12 DIAGNOSIS — K5900 Constipation, unspecified: Secondary | ICD-10-CM | POA: Diagnosis not present

## 2019-01-12 DIAGNOSIS — R310 Gross hematuria: Secondary | ICD-10-CM

## 2019-01-12 NOTE — Progress Notes (Signed)
Subjective:  Patient ID: Melissa Douglas, female    DOB: 1941/05/14  Age: 78 y.o. MRN: [id number]  CC: Hematuria (onet 10 hrs urine in blood , drops, bright red, darker in toliet/pull-up, 29mins ago looked like mense , ABD pain,)   accompanied by husband and caregiver  Hematuria  This is a new problem. The current episode started yesterday. The problem is unchanged. She describes the hematuria as gross hematuria. The hematuria occurs throughout her entire urinary stream. She reports no clotting in her urine stream. Her pain is at a severity of 0/10. She is experiencing no pain. She describes her urine color as light pink. Irritative symptoms do not include frequency, nocturia or urgency. Obstructive symptoms do not include dribbling, incomplete emptying, an intermittent stream, a slower stream, straining or a weak stream. Pertinent negatives include no abdominal pain, chills, dysuria, fever, flank pain, genital pain, inability to urinate, nausea or vomiting. She is not sexually active.  s/p hysterectomy Allergy to contrast and iodine.  Reviewed past Medical, Social and Family history today.  Outpatient Medications Prior to Visit  Medication Sig Dispense Refill  . Carbidopa-Levodopa ER (SINEMET CR) 25-100 MG tablet controlled release Take 1 tablet 4 (four) times daily by mouth.  98  . cetirizine (ZYRTEC) 10 MG tablet Take 1 tablet (10 mg total) by mouth daily. 30 tablet 11  . cholecalciferol (VITAMIN D) 1000 units tablet Take 2,000 Units by mouth daily.    Marland Kitchen donepezil (ARICEPT) 5 MG tablet Take 5 mg by mouth every evening.   0  . fluticasone (FLONASE) 50 MCG/ACT nasal spray Place 2 sprays into both nostrils daily. 16 g 6  . levothyroxine (SYNTHROID, LEVOTHROID) 75 MCG tablet TAKE 1 TABLET(75 MCG) BY MOUTH DAILY 90 tablet 0  . mirtazapine (REMERON) 30 MG tablet Take 1 tablet (30 mg total) by mouth at bedtime.    Melissa Douglas Tartrate (NUPLAZID) 34 MG CAPS Take 34 mg by mouth.      No  facility-administered medications prior to visit.     ROS See HPI  Objective:  BP (!) 118/52   Pulse 78   Temp 98.1 F (36.7 C) (Oral)   Ht 5\' 2"  (1.575 m)   SpO2 98%   BMI 20.30 kg/m   BP Readings from Last 3 Encounters:  01/12/19 (!) 118/52  11/19/18 122/76  11/16/18 (!) 137/91    Wt Readings from Last 3 Encounters:  09/14/17 111 lb (50.3 kg)  06/11/17 111 lb (50.3 kg)  04/30/17 128 lb (58.1 kg)    Physical Exam Cardiovascular:     Rate and Rhythm: Normal rate.     Pulses: Normal pulses.  Pulmonary:     Effort: Pulmonary effort is normal.  Abdominal:     General: Bowel sounds are normal.     Palpations: Abdomen is soft.     Tenderness: There is no abdominal tenderness.     Lab Results  Component Value Date   WBC 5.9 11/16/2018   HGB 12.0 11/16/2018   HCT 39.0 11/16/2018   PLT 239 11/16/2018   GLUCOSE 85 11/16/2018   CHOL 181 02/09/2016   TRIG 109.0 02/09/2016   HDL 50.30 02/09/2016   LDLDIRECT 98.0 04/10/2013   LDLCALC 109 (H) 02/09/2016   ALT 8 11/16/2018   AST 12 (L) 11/16/2018   NA 140 11/16/2018   K 3.6 11/16/2018   CL 106 11/16/2018   CREATININE 0.58 11/16/2018   BUN 14 11/16/2018   CO2 25 11/16/2018   TSH  2.468 11/16/2018   INR 0.97 09/09/2017    Dg Chest Portable 1 View  Result Date: 11/16/2018 CLINICAL DATA:  78 y/o F; increasing weakness and lethargy. Chronic cough. EXAM: PORTABLE CHEST 1 VIEW COMPARISON:  08/20/2018 CT chest.  04/28/2018 chest radiograph. FINDINGS: Normal cardiac silhouette. Aortic atherosclerosis with calcification. Right upper lobe pulmonary nodule poorly visualized, better characterized on CT. No focal consolidation. No pleural effusion or pneumothorax. No acute osseous abnormality is evident. IMPRESSION: No active disease. Right upper lobe pulmonary nodule is better characterized on CT. Electronically Signed   By: Kristine Garbe M.D.   On: 11/16/2018 14:16    Assessment & Plan:   Melissa Douglas was seen today  for hematuria.  Diagnoses and all orders for this visit:  Gross hematuria -     Urinalysis w microscopic + reflex cultur; Future -     Cancel: CT ABDOMEN PELVIS W WO CONTRAST; Future -     CT Abdomen Pelvis Wo Contrast; Future  Constipation, unspecified constipation type -     bisacodyl (DULCOLAX) 10 MG suppository; Place 1 suppository (10 mg total) rectally daily as needed for moderate constipation.  Other orders -     Cancel: POCT urinalysis dipstick   I am having Melissa Douglas. [name] start on bisacodyl. I am also having her maintain her cholecalciferol, Carbidopa-Levodopa ER, donepezil, mirtazapine, NUPLAZID, fluticasone, cetirizine, and levothyroxine.  Meds ordered this encounter  Medications  . bisacodyl (DULCOLAX) 10 MG suppository    Sig: Place 1 suppository (10 mg total) rectally daily as needed for moderate constipation.    Dispense:  12 suppository    Refill:  0    Order Specific Question:   Supervising Provider    Answer:   MATTHEWS, CODY [[id number]]    Problem List Items Addressed This Visit      Other   Hematuria - Primary   Relevant Orders   Urinalysis w microscopic + reflex cultur   CT Abdomen Pelvis Wo Contrast (Completed)    Other Visit Diagnoses    Constipation, unspecified constipation type       Relevant Medications   bisacodyl (DULCOLAX) 10 MG suppository       Follow-up: No follow-ups on file.  Wilfred Lacy, NP

## 2019-01-12 NOTE — Patient Instructions (Addendum)
No acute finding to explain hematuria. Large amount of stool in rectum. Need insert dulcolax suppository. Rx sent Return urine sample to lab as soon as possible.  Return urine to lab as soon as possible.

## 2019-01-13 ENCOUNTER — Encounter: Payer: Self-pay | Admitting: Nurse Practitioner

## 2019-01-13 ENCOUNTER — Other Ambulatory Visit (HOSPITAL_BASED_OUTPATIENT_CLINIC_OR_DEPARTMENT_OTHER): Payer: Medicare Other

## 2019-01-13 ENCOUNTER — Telehealth: Payer: Self-pay | Admitting: Nurse Practitioner

## 2019-01-13 ENCOUNTER — Ambulatory Visit (HOSPITAL_BASED_OUTPATIENT_CLINIC_OR_DEPARTMENT_OTHER)
Admission: RE | Admit: 2019-01-13 | Discharge: 2019-01-13 | Disposition: A | Discharge status: Home / Self Care | Payer: Medicare Other | Source: Ambulatory Visit | Attending: Nurse Practitioner | Admitting: Nurse Practitioner

## 2019-01-13 ENCOUNTER — Other Ambulatory Visit: Payer: Medicare Other

## 2019-01-13 DIAGNOSIS — N200 Calculus of kidney: Secondary | ICD-10-CM | POA: Diagnosis not present

## 2019-01-13 DIAGNOSIS — R310 Gross hematuria: Secondary | ICD-10-CM

## 2019-01-13 DIAGNOSIS — N3001 Acute cystitis with hematuria: Secondary | ICD-10-CM

## 2019-01-13 MED ORDER — BISACODYL 10 MG RE SUPP: 10.0000 mg | Freq: Every day | RECTAL | 0 refills | Status: AC | PRN

## 2019-01-13 NOTE — Telephone Encounter (Signed)
Please inform husband that he may also use fleets enema OTC once daily as needed, if no results with dulcolax suppository after 2days. F/up with pcp if no BM with above suggestions

## 2019-01-14 ENCOUNTER — Emergency Department (HOSPITAL_COMMUNITY)
Admission: EM | Admit: 2019-01-14 | Discharge: 2019-01-14 | Disposition: A | Discharge status: Home / Self Care | Payer: Medicare Other | Attending: Emergency Medicine | Admitting: Emergency Medicine

## 2019-01-14 ENCOUNTER — Other Ambulatory Visit: Payer: Self-pay

## 2019-01-14 ENCOUNTER — Encounter: Payer: Self-pay | Admitting: Nurse Practitioner

## 2019-01-14 DIAGNOSIS — Z87891 Personal history of nicotine dependence: Secondary | ICD-10-CM | POA: Insufficient documentation

## 2019-01-14 DIAGNOSIS — G20 Parkinson's disease: Secondary | ICD-10-CM | POA: Diagnosis not present

## 2019-01-14 DIAGNOSIS — E039 Hypothyroidism, unspecified: Secondary | ICD-10-CM | POA: Insufficient documentation

## 2019-01-14 DIAGNOSIS — K5900 Constipation, unspecified: Secondary | ICD-10-CM

## 2019-01-14 DIAGNOSIS — Z79899 Other long term (current) drug therapy: Secondary | ICD-10-CM | POA: Diagnosis not present

## 2019-01-14 DIAGNOSIS — K5641 Fecal impaction: Secondary | ICD-10-CM | POA: Diagnosis present

## 2019-01-14 LAB — COMPREHENSIVE METABOLIC PANEL
ALT: <5 U/L (ref 0–44)
AST: 14 U/L — ABNORMAL LOW (ref 15–41)
Albumin: 3.9 g/dL (ref 3.5–5.0)
Alkaline Phosphatase: 69 U/L (ref 38–126)
Anion gap: 9 (ref 5–15)
BUN: 19 mg/dL (ref 8–23)
CO2: 24 mmol/L (ref 22–32)
Calcium: 9.7 mg/dL (ref 8.9–10.3)
Chloride: 105 mmol/L (ref 98–111)
Creatinine, Ser: 0.65 mg/dL (ref 0.44–1.00)
GFR calc Af Amer: >60 mL/min (ref >60)
GFR calc non Af Amer: >60 mL/min (ref >60)
Glucose, Bld: 100 mg/dL — ABNORMAL HIGH (ref 70–99)
Potassium: 3.8 mmol/L (ref 3.5–5.1)
SODIUM: 138 mmol/L (ref 135–145)
Total Bilirubin: 0.6 mg/dL (ref 0.3–1.2)
Total Protein: 6.9 g/dL (ref 6.5–8.1)

## 2019-01-14 LAB — CBC
HCT: 40.9 % (ref 36.0–46.0)
HEMOGLOBIN: 13 g/dL (ref 12.0–15.0)
MCH: 27.8 pg (ref 26.0–34.0)
MCHC: 31.8 g/dL (ref 30.0–36.0)
MCV: 87.6 fL (ref 80.0–100.0)
Platelets: 285 10*3/uL (ref 150–400)
RBC: 4.67 MIL/uL (ref 3.87–5.11)
RDW: 12.9 % (ref 11.5–15.5)
WBC: 7.7 10*3/uL (ref 4.0–10.5)
nRBC: 0 % (ref 0.0–0.2)

## 2019-01-14 LAB — LIPASE, BLOOD: LIPASE: 28 U/L (ref 11–51)

## 2019-01-14 MED ORDER — SODIUM CHLORIDE 0.9% FLUSH: 3.0000 mL | Freq: Once | INTRAVENOUS | Status: DC

## 2019-01-14 MED ORDER — NITROFURANTOIN MONOHYD MACRO 100 MG PO CAPS: 100.0000 mg | ORAL_CAPSULE | Freq: Two times a day (BID) | ORAL | 0 refills | Status: DC

## 2019-01-14 NOTE — ED Triage Notes (Signed)
Pt being treated for UTI, while being seen for that, had CT of abd yesterday, urine specimen yesterday-  Hx of parkinson's, husband and CNA with pt.  Husband stated that he was unable to get suppository in, dr's office told to come here.

## 2019-01-14 NOTE — ED Provider Notes (Signed)
[facility] EMERGENCY DEPARTMENT Provider Note   CSN: [id number] Arrival date & time: 01/14/19  1130    History   Chief Complaint Chief Complaint  Patient presents with  . Abdominal Pain  . Fecal Impaction    HPI LYNNELL FIUMARA is a 78 y.o. female.     HPI Patient referred to emergency department by PCP for fecal impaction identified on CT scan yesterday.  Patient husband reports that he was unable to give suppositories as instructed and gave her a laxative last night.  He reports that the doctor told him to come to the emergency department to manage impaction findings.  The patient's caregiver reports that she did have a bowel movement before leaving the house today and then another softball-like bowel movement while in the emergency department before being seen.  Patient is denying that she is having any pain.  There has not been vomiting or fever. Past Medical History:  Diagnosis Date  . Cataract Right eye   HAD SURGERY  . Chronic joint pain   . Hx of adenomatous colonic polyps 08/18/2014  . Hypothyroidism   . Multiple nasal polyps   . Osteopenia   . Parkinson disease [facility])     Patient Active Problem List   Diagnosis Date Noted  . Dysphagia, neurologic 11/19/2018  . Hematuria 11/19/2018  . 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 Memory loss 12/13/2014  . Soft tissue mass 12/13/2014  . Recent skin changes 12/13/2014  . Hx of adenomatous colonic polyps 08/18/2014  . Fungal dermatitis 03/19/2014  . Overweight 03/19/2014  . Cataract   . Constipation, slow transit 09/03/2012  . Seasonal allergic rhinitis 04/18/2012  . General medical examination 05/18/2011  . Elevated BP 03/13/2011  . Vaginal dryness, menopausal 03/13/2011  . Vitamin D deficiency 03/13/2011  . Hypertriglyceridemia 03/13/2011  . OSTEOPENIA 05/31/2010  . Parkinsons disease ([place]) 04/07/2010  . POSTMENOPAUSAL STATUS 04/07/2010  . 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 DRY MOUTH  03/07/2010  . 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 PARESTHESIA 01/26/2009  . Hypothyroidism 10/20/2008  . 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 ABDOMINAL HYSTERECTOMY     ovaries remain  . APPENDECTOMY    . CATARACT EXTRACTION    . TONSILLECTOMY       OB History   No obstetric history on file.      Home Medications    Prior to Admission medications   Medication Sig Start Date End Date Taking? Authorizing Provider  bisacodyl (DULCOLAX) 10 MG suppository Place 1 suppository (10 mg total) rectally daily as needed for moderate constipation. 01/13/19   Nche, Charlene Brooke, NP  Carbidopa-Levodopa ER (SINEMET CR) 25-100 MG tablet controlled release Take 1 tablet 4 (four) times daily by mouth. 09/02/17   [provider]  cetirizine (ZYRTEC) 10 MG tablet Take 1 tablet (10 mg total) by mouth daily. 07/09/18   Midge Minium, MD  cholecalciferol (VITAMIN D) 1000 units tablet Take 2,000 Units by mouth daily.    [provider]  donepezil (ARICEPT) 5 MG tablet Take 5 mg by mouth every evening.  09/20/17   [provider]  fluticasone (FLONASE) 50 MCG/ACT nasal spray Place 2 sprays into both nostrils daily. 07/09/18   Midge Minium, MD  levothyroxine (SYNTHROID, LEVOTHROID) 75 MCG tablet TAKE 1 TABLET(75 MCG) BY MOUTH DAILY 11/18/18   Midge Minium, MD  mirtazapine (REMERON) 30 MG tablet Take 1 tablet (30 mg  total) by mouth at bedtime. 11/21/17   Nche, Charlene Brooke, NP  nitrofurantoin, macrocrystal-monohydrate, (MACROBID) 100 MG capsule Take 1 capsule (100 mg total) by mouth 2 (two) times daily for 3 days. 01/14/19 01/17/19  Nche, Charlene Brooke, NP  Pimavanserin Tartrate (NUPLAZID) 34 MG CAPS Take 34 mg by mouth.     [provider]    Family History No family history on file.  Social History Social History   Tobacco Use  . Smoking status: Former Smoker    Types: Cigarettes    Last attempt  to quit: 01/01/2001    Years since quitting: 18.0  . Smokeless tobacco: Never Used  Substance Use Topics  . Alcohol use: No    Alcohol/week: 12.0 - 14.0 standard drinks    Types: 12 - 14 Glasses of wine per week    Comment: not since being in the facility   . Drug use: No     Allergies   Codeine; Ether; Gabapentin; Iodine; and Iodinated diagnostic agents   Review of Systems Review of Systems  10 Systems reviewed and are negative for acute change except as noted in the HPI.  Physical Exam Updated Vital Signs BP 135/77 (BP Location: Right Arm)   Pulse 75   Temp 98.1 F (36.7 C) (Oral)   Resp 16   Ht 5\' 2"  (1.575 m)   Wt 59.9 kg   SpO2 100%   BMI 24.14 kg/m   Physical Exam Constitutional:      Comments: Patient is nontoxic and alert.  She is sitting in a wheelchair.  She is then subsequently examined in the stretcher.  HENT:     Head: Normocephalic and atraumatic.  Eyes:     Extraocular Movements: Extraocular movements intact.  Cardiovascular:     Rate and Rhythm: Normal rate and regular rhythm.     Pulses: Normal pulses.     Heart sounds: Normal heart sounds.  Pulmonary:     Effort: Pulmonary effort is normal.     Breath sounds: Normal breath sounds.  Abdominal:     General: There is no distension.     Palpations: Abdomen is soft.     Tenderness: There is no abdominal tenderness. There is no guarding.  Genitourinary:    Comments: Patient unequivocally refuses to have rectal exam. Musculoskeletal: Normal range of motion.     Comments: Trace edema bilateral lower extremities.  Calf soft and nontender.  Skin:    General: Skin is warm and dry.  Neurological:     Mental Status: She is alert.     Comments: Patient is awake and alert.  She does have speech patterns that are consistent with dementia.  Movements are purposeful.      ED Treatments / Results  Labs (all labs ordered are listed, but only abnormal results are displayed) Labs Reviewed  COMPREHENSIVE  METABOLIC PANEL - Abnormal; Notable for the following components:      Result Value   Glucose, Bld 100 (*)    AST 14 (*)    All other components within normal limits  LIPASE, BLOOD  CBC    EKG None  Radiology Ct Abdomen Pelvis Wo Contrast  Result Date: 01/13/2019 CLINICAL DATA:  Hematuria with unknown cause. EXAM: CT ABDOMEN AND PELVIS WITHOUT CONTRAST TECHNIQUE: Multidetector CT imaging of the abdomen and pelvis was performed following the standard protocol without IV contrast. COMPARISON:  05/27/2017 FINDINGS: Lower chest:  No contributory findings. Hepatobiliary: No focal liver abnormality.No evidence of biliary obstruction or stone.  Pancreas: Unremarkable. Spleen: Unremarkable. Adrenals/Urinary Tract: Negative adrenals. 2 mm nonobstructing right renal calculus. 6 mm left renal calculus that is new. There are 2 additional punctate peripheral calcifications within the left renal hilum that could be urinary. There also atherosclerotic renal hilar calcifications. No ureteral stone. Soft tissue and fatty density left renal mass measuring up to 2.3 cm, stable from prior. There is no surrounding stranding or enlargement to suggest interval macroscopic hemorrhage. Stomach/Bowel: The rectum is distended by stool, but associated inflammation on prior CT has resolved. No bowel obstruction. History of appendectomy. Vascular/Lymphatic: No acute vascular abnormality. Atherosclerotic calcification. No mass or adenopathy. Reproductive:Hysterectomy. Other: No ascites or pneumoperitoneum. Musculoskeletal: No acute abnormalities. IMPRESSION: 1. Bilateral nonobstructive nephrolithiasis. 2.  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 3. Stool distended rectum. Electronically Signed   By: Monte Fantasia M.D.   On: 01/13/2019 11:17    Procedures Procedures (including critical care time)  Medications Ordered in ED Medications  sodium chloride flush (NS) 0.9 % injection 3 mL (has no  administration in time range)     Initial Impression / Assessment and Plan / ED Course  I have reviewed the triage vital signs and the nursing notes.  Pertinent labs & imaging results that were available during my care of the patient were reviewed by me and considered in my medical decision making (see chart for details).       I have reviewed the patient's CT scan.  There was a large amount of stool in the rectal vault.  Patient is nontoxic and alert.  She is not in distress or pain at this time.  For not very clear reasons, patient unequivocally is refusing to have rectal exam.  She does not believe that she needs it.  Patient's caregiver is describing several bowel movements this morning.  It is possible that she has passed the impaction however we also discussed the possibility of fecal overflow incontinence.  Has dementia by history and the nature of our conversation confirms that.  Patient however is adamant that she will not have a rectal exam today and patient's husband and caregiver respect the patient's decision making in that respect.  Patient is discharged in stable condition with recommendation to follow-up with PCP.  Final Clinical Impressions(s) / ED Diagnoses   Final diagnoses:  Constipation, unspecified constipation type    ED Discharge Orders    None       Charlesetta Shanks, MD 01/14/19 1534

## 2019-01-14 NOTE — Addendum Note (Signed)
Addended by: Leana Gamer on: 01/14/2019 08:18 AM   Modules accepted: Orders

## 2019-01-14 NOTE — ED Notes (Signed)
Patient verbalizes understanding of discharge instructions. Opportunity for questioning and answers were provided. Armband removed by staff, pt discharged from ED.  

## 2019-01-14 NOTE — Telephone Encounter (Signed)
Spoke with the pt's spouse, per charlotte the pt needs to go to ED. [name] (verbalized understand).

## 2019-01-15 LAB — URINALYSIS W MICROSCOPIC + REFLEX CULTURE
Bacteria, UA: NONE SEEN /HPF
Bilirubin Urine: NEGATIVE
Glucose, UA: NEGATIVE
Hyaline Cast: NONE SEEN /LPF
Ketones, ur: NEGATIVE
Nitrites, Initial: NEGATIVE
RBC / HPF: >=60 /HPF — AB (ref 0–2)
Specific Gravity, Urine: 1.022 (ref 1.001–1.03)
Squamous Epithelial / HPF: NONE SEEN /HPF (ref <=5)
pH: 6.5 (ref 5.0–8.0)

## 2019-01-15 LAB — URINE CULTURE
MICRO NUMBER:: 275162
SPECIMEN QUALITY:: ADEQUATE

## 2019-01-15 LAB — CULTURE INDICATED

## 2019-01-16 MED ORDER — SULFAMETHOXAZOLE-TRIMETHOPRIM 800-160 MG PO TABS: 1.0000 | ORAL_TABLET | Freq: Two times a day (BID) | ORAL | 0 refills | Status: DC

## 2019-01-16 NOTE — Addendum Note (Signed)
Addended by: Wilfred Lacy L on: 01/16/2019 01:57 PM   Modules accepted: Orders

## 2019-01-16 NOTE — Telephone Encounter (Signed)
-----   Message from Flossie Buffy, NP sent at 01/16/2019  1:56 PM EST ----- Urine culture is positive for bacteria, but resistant to macrobid. Stop current oral abx and start bactrim as precribed

## 2019-01-20 DIAGNOSIS — Z8673 Personal history of transient ischemic attack (TIA), and cerebral infarction without residual deficits: Secondary | ICD-10-CM | POA: Diagnosis not present

## 2019-01-20 DIAGNOSIS — G248 Other dystonia: Secondary | ICD-10-CM | POA: Diagnosis not present

## 2019-01-20 DIAGNOSIS — G3183 Dementia with Lewy bodies: Secondary | ICD-10-CM | POA: Diagnosis not present

## 2019-01-27 ENCOUNTER — Ambulatory Visit: Payer: Self-pay | Admitting: *Deleted

## 2019-01-27 NOTE — Telephone Encounter (Signed)
Pt's husband calling to report pt had meds last night and "Began coughing up clear, sticky phlegm." States has been coughing today and sounds "Nasal." TN questioned husband regarding pt's swallowing and any H/O aspiration; states pt did have recent swallow study and is high risk for aspiration. States she is afebrile. States he gives pt her meds at 90 degrees but pt needs to sleep at 30(hospital bed.) He is questioning if he can give her Flonase at [facility] as well as AM as ordered or additional dose of Zyrtec. Pt takes meds whole, not liquid or crushed. States pt does does not have "Rattling in chest." Pt has caregivers every day 8a-4p.  Please advise:  [phone]  Answer Assessment - Initial Assessment Questions 1. SYMPTOMS: "Do you have any symptoms?"   cough 2. SEVERITY: If symptoms are present, ask "Are they mild, moderate or severe?"    Moderate. TN concern for aspiration  Protocols used: MEDICATION QUESTION CALL-A-AH

## 2019-01-28 NOTE — Telephone Encounter (Signed)
Called and left a detailed message on voicemail to advise of PCP recommendations.   V[place] for [facility] to Discuss results / PCP recommendations / Schedule patient.

## 2019-01-28 NOTE — Telephone Encounter (Signed)
Ok to increase Zyrtec to twice daily.  Would not recommend increasing Flonase as this could cause nasal bleeding.  It would also be worthwhile to try nasal bulb suction

## 2019-01-29 ENCOUNTER — Other Ambulatory Visit: Payer: Self-pay | Admitting: Family Medicine

## 2019-02-02 ENCOUNTER — Encounter: Payer: Self-pay | Admitting: Nurse Practitioner

## 2019-02-03 ENCOUNTER — Encounter: Payer: Self-pay | Admitting: Family Medicine

## 2019-02-05 ENCOUNTER — Other Ambulatory Visit: Payer: Self-pay

## 2019-02-05 ENCOUNTER — Encounter: Payer: Self-pay | Admitting: *Deleted

## 2019-02-05 ENCOUNTER — Other Ambulatory Visit (INDEPENDENT_AMBULATORY_CARE_PROVIDER_SITE_OTHER): Payer: Medicare Other

## 2019-02-05 DIAGNOSIS — R319 Hematuria, unspecified: Secondary | ICD-10-CM

## 2019-02-05 DIAGNOSIS — R300 Dysuria: Secondary | ICD-10-CM | POA: Diagnosis not present

## 2019-02-05 LAB — POCT URINALYSIS DIPSTICK
BILIRUBIN UA: NEGATIVE
GLUCOSE UA: NEGATIVE
Ketones, UA: NEGATIVE
Leukocytes, UA: NEGATIVE
Nitrite, UA: NEGATIVE
Protein, UA: POSITIVE — AB
Spec Grav, UA: >=1.03 — AB (ref 1.010–1.025)
Urobilinogen, UA: 0.2 E.U./dL
pH, UA: 6 (ref 5.0–8.0)

## 2019-02-06 LAB — URINE CULTURE
MICRO NUMBER:: 356035
Result:: NO GROWTH
SPECIMEN QUALITY:: ADEQUATE

## 2019-02-09 ENCOUNTER — Encounter: Payer: Self-pay | Admitting: Family Medicine

## 2019-02-13 ENCOUNTER — Other Ambulatory Visit: Payer: Self-pay | Admitting: Family Medicine

## 2019-02-23 ENCOUNTER — Encounter: Payer: Self-pay | Admitting: Family Medicine

## 2019-02-24 ENCOUNTER — Telehealth: Payer: Self-pay | Admitting: Family Medicine

## 2019-02-24 NOTE — Telephone Encounter (Signed)
Copied from [place] [phone]. Topic: General - Other >> Feb 24, 2019  4:57 PM Keene Breath wrote: Reason for CRM: Patient's husband called to speak with the doctor and follow up regarding the blood in his wife's urine.  Doctor sent a message over My Chart regarding a referral to a urologist.  Patient's husband agreed and would like for his wife to see a urologist.  Please call patient once the referral has been placed.  CB# [phone]

## 2019-02-25 ENCOUNTER — Other Ambulatory Visit: Payer: Self-pay | Admitting: *Deleted

## 2019-02-25 DIAGNOSIS — R310 Gross hematuria: Secondary | ICD-10-CM | POA: Diagnosis not present

## 2019-02-25 DIAGNOSIS — N202 Calculus of kidney with calculus of ureter: Secondary | ICD-10-CM | POA: Diagnosis not present

## 2019-02-25 DIAGNOSIS — N200 Calculus of kidney: Secondary | ICD-10-CM

## 2019-02-25 DIAGNOSIS — N201 Calculus of ureter: Secondary | ICD-10-CM | POA: Diagnosis not present

## 2019-02-25 DIAGNOSIS — R1012 Left upper quadrant pain: Secondary | ICD-10-CM | POA: Diagnosis not present

## 2019-02-25 NOTE — Telephone Encounter (Signed)
Per Dr Birdie Riddle, I have placed STAT referral for Urology.  I have spoken with patient's husband to let him know.

## 2019-02-26 ENCOUNTER — Other Ambulatory Visit: Payer: Self-pay | Admitting: Urology

## 2019-03-09 ENCOUNTER — Other Ambulatory Visit: Payer: Self-pay

## 2019-03-09 ENCOUNTER — Encounter (HOSPITAL_COMMUNITY): Payer: Self-pay | Admitting: *Deleted

## 2019-03-09 NOTE — Progress Notes (Signed)
Completed preprocedure phone call with husband- Melissa Douglas,  Patient has Parkinson's and dementia , in a wheelchair but transfers.  Husband signs consent form.  Will bring patient in to short Stay and sign consent then will return to vehicle and return once called by Short Stay.  Covid 19 questions asked. SPOKE W/  _husband      SCREENING SYMPTOMS OF COVID 19:   COUGH--no  RUNNY NOSE--- no  SORE THROAT---no  NASAL CONGESTION----no  SNEEZING----no  SHORTNESS OF BREATH---no  DIFFICULTY BREATHING---no  TEMP >100.4-----no  UNEXPLAINED BODY ACHES------no   HAVE YOU OR ANY FAMILY MEMBER TRAVELLED PAST 14 DAYS OUT OF THE   COUNTY---no STATE----no COUNTRY----no  HAVE YOU OR ANY FAMILY MEMBER BEEN EXPOSED TO ANYONE WITH COVID 19? no  Husband to bring blue folder with info completed.  Aware for patient to have 2 pieces of clothing and no belts. Aware no NSAIDS x 48 hours and no Aspirin or Aspirin products x 72 hours.

## 2019-03-11 ENCOUNTER — Encounter: Payer: Self-pay | Admitting: Family Medicine

## 2019-03-11 NOTE — H&P (Signed)
Office Visit Report     02/25/2019   --------------------------------------------------------------------------------   Melissa Douglas  MRN: [id number]  PRIMARY CARE:  Melissa Douglas. Melissa Riddle, MD  DOB: 05/13/1941, 78 year old Female  REFERRING:  Melissa Douglas. Melissa Riddle, MD  SSN:   PROVIDER:  Raynelle Douglas, M.D.    LOCATION:  [facility] [phone]   --------------------------------------------------------------------------------   CC/HPI: Gross hematuria   Ms. Melissa Douglas is a 78 year old female seen today at the request of Dr. Birdie Douglas for gross hematuria. She initially developed hematuria in early March. She describes this as being painless and without other associated symptoms. She underwent a CT scan without contrast at that time it did demonstrate bilateral nonobstructing renal calculi including a 5-6 mm left renal pelvic stone. She has no history of symptomatic urolithiasis. Her symptoms occurred intermittently and she then developed the acute onset of severe left upper quadrant pain yesterday again associated with hematuria. She has denied any associated fever or nausea/vomiting. Her pain has resolved as of today.     ALLERGIES:  codeine gabapentin Iodine TINC    MEDICATIONS: Levothyroxine Sodium 75 mcg tablet  Zyrtec  Aricept 5 mg tablet  Carbidopa-Levodopa 25 mg-100 mg tablet  Ducolax  Flonase Allergy Relief  Nuplazid 34 mg capsule  Remeron 30 mg tablet  Vitamin D2 2,000 unit tablet     GU PSH: None   NON-GU PSH: Cataract surgery, Bilateral    GU PMH: None   NON-GU PMH: Parkinson''s disease Unspecified dementia without behavioral disturbance    FAMILY HISTORY: 3 daughters - Daughter muscular dystrophy - Daughter   SOCIAL HISTORY: Marital Status: Married Preferred Language: English; Ethnicity: Not Hispanic Or Latino; Race: White Current Smoking Status: Patient does not smoke anymore.   Tobacco Use Assessment Completed: Used Tobacco in last  30 days? Light Drinker.  Does not drink caffeine.    REVIEW OF SYSTEMS:    GU Review Female:   Patient reports frequent urination. Patient denies hard to postpone urination, burning /pain with urination, get up at night to urinate, leakage of urine, stream starts and stops, trouble starting your stream, have to strain to urinate, and currently pregnant.  Gastrointestinal (Lower):   Patient denies diarrhea and constipation.  Gastrointestinal (Upper):   Patient denies nausea and vomiting.  Constitutional:   Patient denies fever, night sweats, weight loss, and fatigue.  Skin:   Patient denies skin rash/ lesion and itching.  Eyes:   Patient denies blurred vision and double vision.  Ears/ Nose/ Throat:   Patient denies sore throat and sinus problems.  Hematologic/Lymphatic:   Patient denies swollen glands and easy bruising.  Cardiovascular:   Patient denies leg swelling and chest pains.  Respiratory:   Patient denies cough and shortness of breath.  Endocrine:   Patient denies excessive thirst.  Musculoskeletal:   Patient denies back pain and joint pain.  Neurological:   Patient denies headaches and dizziness.  Psychologic:   Patient denies depression and anxiety.   VITAL SIGNS:      02/25/2019 11:37 AM  Weight 130 lb / 58.97 kg  Height 63 in / 160.02 cm  BP 118/64 mmHg  Pulse 76 /min  Temperature 97.4 F / 36.3 C  BMI 23.0 kg/m   MULTI-SYSTEM PHYSICAL EXAMINATION:    Constitutional: Well-nourished. No physical deformities. Normally developed. Good grooming. She is wheelchair-bound due to difficulty ambulating from Parkinson's disease.  Neck: Neck symmetrical, not swollen. Normal tracheal position.  Respiratory: No labored breathing, no use of  accessory muscles.   Cardiovascular: Normal temperature, normal extremity pulses, no swelling, no varicosities. Regular rate and rhythm.  Lymphatic: No enlargement of neck, axillae, groin.  Skin: No paleness, no jaundice, no cyanosis. No lesion,  no ulcer, no rash.  Neurologic / Psychiatric: Oriented to time, oriented to place, oriented to person. No depression, no anxiety, no agitation.   Gastrointestinal: No mass, no tenderness, no rigidity, non obese abdomen. Mild left CVA tenderness.  Eyes: Normal conjunctivae. Normal eyelids.  Ears, Nose, Mouth, and Throat: Left ear no scars, no lesions, no masses. Right ear no scars, no lesions, no masses. Nose no scars, no lesions, no masses. Normal hearing. Normal lips.  Musculoskeletal: Normal gait and station of head and neck.     PAST DATA REVIEWED:  Source Of History:  Patient  X-Ray Review: C.T. Abdomen/Pelvis: Reviewed Films.    Notes:                     CLINICAL DATA: Hematuria with unknown cause.   EXAM:  CT ABDOMEN AND PELVIS WITHOUT CONTRAST   TECHNIQUE:  Multidetector CT imaging of the abdomen and pelvis was performed  following the standard protocol without IV contrast.   COMPARISON: 05/27/2017   FINDINGS:  Lower chest: No contributory findings.   Hepatobiliary: No focal liver abnormality.No evidence of biliary  obstruction or stone.   Pancreas: Unremarkable.   Spleen: Unremarkable.   Adrenals/Urinary Tract: Negative adrenals. 2 mm nonobstructing right  renal calculus. 6 mm left renal calculus that is new. There are 2  additional punctate peripheral calcifications within the left renal  hilum that could be urinary. There also atherosclerotic renal hilar  calcifications. No ureteral stone. Soft tissue and fatty density  left renal mass measuring up to 2.3 cm, stable from prior. There is  no surrounding stranding or enlargement to suggest interval  macroscopic hemorrhage.   Stomach/Bowel: The rectum is distended by stool, but associated  inflammation on prior CT has resolved. No bowel obstruction. History  of appendectomy.   Vascular/Lymphatic: No acute vascular abnormality. Atherosclerotic  calcification. No mass or adenopathy.   Reproductive:Hysterectomy.    Other: No ascites or pneumoperitoneum.   Musculoskeletal: No acute abnormalities.   IMPRESSION:  1. Bilateral nonobstructive nephrolithiasis.  2. 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  3. Stool distended rectum.    Electronically Signed  By: Monte Fantasia M.D.  On: 01/13/2019 11:17   I INDEPENDENTLY REVIEWED HER KUB X-RAY TODAY. THIS DOES DEMONSTRATE A 6-7 MM CALCIFICATION IN THE VICINITY OF THE PROXIMAL LEFT URETER CONSISTENT WITH MIGRATION OF HER PREVIOUSLY NOTED RENAL PELVIC STONE.   PROCEDURES:         KUB - [id number]  A single view of the abdomen is obtained.      Patient confirmed No Neulasta OnPro Device.            Urinalysis w/Scope Dipstick Dipstick Cont'd Micro  Color: Yellow Bilirubin: Neg mg/dL WBC/hpf: 0 - 5/hpf  Appearance: Cloudy Ketones: Neg mg/dL RBC/hpf: >60/hpf  Specific Gravity: 1.020 Blood: 3+ ery/uL Bacteria: Mod (26-50/hpf)  pH: 5.5 Protein: Trace mg/dL Cystals: NS (Not Seen)  Glucose: Neg mg/dL Urobilinogen: 0.2 mg/dL Casts: NS (Not Seen)    Nitrites: Neg Trichomonas: Not Present    Leukocyte Esterase: Trace leu/uL Mucous: Not Present      Epithelial Cells: 0 - 5/hpf      Yeast: NS (Not Seen)      Sperm: Not Present    ASSESSMENT:  ICD-10 Details  1 GU:   Gross hematuria - R31.0   2   LUQ pain - R10.12   3   Renal calculus - N20.0   4   Ureteral calculus - N20.1   5 NON-GU:   Bacteriuria - R82.71    PLAN:            Medications New Meds: Bactrim Ds 800 mg-160 mg tablet 1 tablet PO BID   #14  0 Refill(s)  Hydrocodone-Acetaminophen 5 mg-325 mg tablet 1-2 tablet PO Q 6 H prn   #20  0 Refill(s)  Tamsulosin Hcl 0.4 mg capsule 1 capsule PO Q HS   #14  0 Refill(s)            Orders Labs BMP, Urine Culture  X-Rays: KUB  X-Ray Notes: ...          Schedule Return Visit/Planned Activity: Other See Visit Notes             Note: Will call to schedule surgery          Document Letter(s):  Created for  Patient: Clinical Summary         Notes:   1. LEFT URETERAL CALCULUS: THIS IS LIKELY THE EXPLANATION FOR HER PAIN SYMPTOMS AND HEMATURIA. WE DISCUSSED THE POSSIBILITY THAT SHE MAY PASS HER STONE ALTHOUGH A HIGHER LIKELIHOOD THAT SHE MAY NOT. SHE WILL BE STARTED ON TAMSULOSIN FOR MEDICAL EXPULSION THERAPY AND WILL STRAIN HER URINE. SHE HAS BEEN PROVIDED A PRESCRIPTION FOR HYDROCODONE FOR PAIN CONTROL. UNDERSTANDING THAT SHE IS LESS LIKELY TO PASS THE STONE OF THIS SIZE AND THE FACT THAT IT IS VISIBLE ON KUB IMAGING TODAY, WE DISCUSSED OPTIONS FOR TREATMENT. PARTICULARLY CONSIDERING THE CONCERN ABOUT COMING INTO THE HOSPITAL FOR PROCEDURE WITH THE COVID-19 SITUATION, WE DID DISCUSS THE OPTION OF SHOCKWAVE LITHOTRIPSY HAS POTENTIALLY A MORE FAVORABLE APPROACH DUE TO DECREASED RISK OF COVID-19 TRANSMISSION.. HER HUSBAND ARE IN AGREEMENT AND WOULD LIKE TO TENTATIVELY BE SCHEDULED FOR LEFT EXTRACORPOREAL SHOCKWAVE LITHOTRIPSY IN APPROXIMATELY 2 WEEKS IF SHE IS UNABLE TO PASS HER STONE. IF SHE DOES PASS HER STONE, SHE WILL NOTIFY ME AND WE WILL CANCEL HIS PROCEDURE. IF SHE HAS FEVER, UNCONTROLLED PAIN, OR PERSISTENT NAUSEA/VOMITING, SHE UNDERSTANDS SHE SHOULD CALL AND MAY NEED TO PROCEED WITH MORE EXPEDIENT TREATMENT.   2. ASYMPTOMATIC BACTERIURIA: THIS IS LIKELY A CONTAMINANT BUT SHE WILL BE EMPIRICALLY TREATED WITH BACTRIM PENDING HER URINE CULTURE. SHE WILL NOTIFY ME IF SHE DEVELOPS FEVER.   3. HEMATURIA: THIS IS LIKELY RELATED TO HER LEFT URETERAL STONE. HOWEVER, WE WILL ENSURE THAT THIS DOES EVENTUALLY RESOLVED FOLLOWING TREATMENT OF HER STONE OR PASSAGE OF HER STONE. IF NOT, I WILL CONSIDER CYSTOSCOPY IN THE FUTURE.   CC: Dr. Dimple Nanas    * Signed by Melissa Douglas, M.D. on 02/25/19 at 4:40 PM (EDT*

## 2019-03-12 ENCOUNTER — Ambulatory Visit (HOSPITAL_COMMUNITY): Payer: Medicare Other

## 2019-03-12 ENCOUNTER — Encounter (HOSPITAL_COMMUNITY)
Admission: RE | Disposition: A | Discharge status: Home / Self Care | Payer: Self-pay | Source: Home / Self Care | Attending: Urology

## 2019-03-12 ENCOUNTER — Encounter (HOSPITAL_COMMUNITY): Payer: Self-pay | Admitting: General Practice

## 2019-03-12 ENCOUNTER — Ambulatory Visit (HOSPITAL_COMMUNITY)
Admission: RE | Admit: 2019-03-12 | Discharge: 2019-03-12 | Disposition: A | Discharge status: Home / Self Care | Payer: Medicare Other | Attending: Urology | Admitting: Urology

## 2019-03-12 DIAGNOSIS — Z8269 Family history of other diseases of the musculoskeletal system and connective tissue: Secondary | ICD-10-CM | POA: Insufficient documentation

## 2019-03-12 DIAGNOSIS — N201 Calculus of ureter: Secondary | ICD-10-CM

## 2019-03-12 DIAGNOSIS — N200 Calculus of kidney: Secondary | ICD-10-CM | POA: Insufficient documentation

## 2019-03-12 DIAGNOSIS — G20 Parkinson's disease: Secondary | ICD-10-CM | POA: Insufficient documentation

## 2019-03-12 DIAGNOSIS — D1771 Benign lipomatous neoplasm of kidney: Secondary | ICD-10-CM | POA: Insufficient documentation

## 2019-03-12 DIAGNOSIS — N202 Calculus of kidney with calculus of ureter: Secondary | ICD-10-CM | POA: Diagnosis present

## 2019-03-12 DIAGNOSIS — Z9841 Cataract extraction status, right eye: Secondary | ICD-10-CM | POA: Insufficient documentation

## 2019-03-12 DIAGNOSIS — Z79899 Other long term (current) drug therapy: Secondary | ICD-10-CM | POA: Diagnosis not present

## 2019-03-12 DIAGNOSIS — Z885 Allergy status to narcotic agent status: Secondary | ICD-10-CM | POA: Diagnosis not present

## 2019-03-12 DIAGNOSIS — Z90710 Acquired absence of both cervix and uterus: Secondary | ICD-10-CM | POA: Diagnosis not present

## 2019-03-12 DIAGNOSIS — Z888 Allergy status to other drugs, medicaments and biological substances status: Secondary | ICD-10-CM | POA: Insufficient documentation

## 2019-03-12 DIAGNOSIS — Z7951 Long term (current) use of inhaled steroids: Secondary | ICD-10-CM | POA: Diagnosis not present

## 2019-03-12 DIAGNOSIS — Z9842 Cataract extraction status, left eye: Secondary | ICD-10-CM | POA: Diagnosis not present

## 2019-03-12 DIAGNOSIS — Z87891 Personal history of nicotine dependence: Secondary | ICD-10-CM | POA: Insufficient documentation

## 2019-03-12 HISTORY — DX: Pneumonia, unspecified organism: J18.9

## 2019-03-12 HISTORY — DX: Personal history of urinary calculi: Z87.442

## 2019-03-12 HISTORY — PX: EXTRACORPOREAL SHOCK WAVE LITHOTRIPSY: SHX1557

## 2019-03-12 HISTORY — DX: Anxiety disorder, unspecified: F41.9

## 2019-03-12 HISTORY — DX: Depression, unspecified: F32.A

## 2019-03-12 HISTORY — DX: Unspecified urinary incontinence: R32

## 2019-03-12 HISTORY — DX: Other specified symptoms and signs involving the circulatory and respiratory systems: R09.89

## 2019-03-12 HISTORY — DX: Unspecified dementia, unspecified severity, without behavioral disturbance, psychotic disturbance, mood disturbance, and anxiety: F03.90

## 2019-03-12 HISTORY — DX: Major depressive disorder, single episode, unspecified: F32.9

## 2019-03-12 SURGERY — LITHOTRIPSY, ESWL
Anesthesia: LOCAL | Laterality: Left

## 2019-03-12 MED ORDER — DIAZEPAM 5 MG PO TABS: 10.0000 mg | ORAL_TABLET | ORAL | Status: DC

## 2019-03-12 MED ORDER — CIPROFLOXACIN HCL 500 MG PO TABS
500.0000 mg | ORAL_TABLET | ORAL | Status: AC
  Administered 2019-03-12: 12:00:00 500 mg via ORAL
  Filled 2019-03-12: qty 1

## 2019-03-12 MED ORDER — SODIUM CHLORIDE 0.9 % IV SOLN
INTRAVENOUS | Status: DC
  Administered 2019-03-12: 12:00:00 via INTRAVENOUS

## 2019-03-12 MED ORDER — DIPHENHYDRAMINE HCL 25 MG PO CAPS
25.0000 mg | ORAL_CAPSULE | ORAL | Status: AC
  Administered 2019-03-12: 25 mg via ORAL
  Filled 2019-03-12: qty 1

## 2019-03-12 NOTE — Discharge Instructions (Addendum)
1. You should strain your urine and collect all fragments and bring them to your follow up appointment.  2. You should take your pain medication as needed.  Please call if your pain is severe to the point that it is not controlled with your pain medication. 3. You should call if you develop fever > 101 or persistent nausea or vomiting.   

## 2019-03-12 NOTE — Interval H&P Note (Signed)
History and Physical Interval Note:  03/12/2019 11:32 AM  Melissa Douglas  has presented today for surgery, with the diagnosis of LEFT URETERAL STONE.  The various methods of treatment have been discussed with the patient and family. After consideration of risks, benefits and other options for treatment, the patient has consented to  Procedure(s): EXTRACORPOREAL SHOCK WAVE LITHOTRIPSY (ESWL) (Left) as a surgical intervention.  The patient's history has been reviewed, patient examined, no change in status, stable for surgery.  I have reviewed the patient's chart and labs.  Questions were answered to the patient's satisfaction.     [employer]

## 2019-03-12 NOTE — Op Note (Signed)
See [facility] operative note scanned into chart. Also because of the size, density, location and other factors that cannot be anticipated I feel this will likely be a staged procedure. This fact supersedes any indication in the scanned [facility] operative note to the contrary.  

## 2019-03-13 ENCOUNTER — Encounter (HOSPITAL_COMMUNITY): Payer: Self-pay | Admitting: Urology

## 2019-04-07 DIAGNOSIS — N201 Calculus of ureter: Secondary | ICD-10-CM | POA: Diagnosis not present

## 2019-04-21 DIAGNOSIS — G248 Other dystonia: Secondary | ICD-10-CM | POA: Diagnosis not present

## 2019-04-21 DIAGNOSIS — G20 Parkinson's disease: Secondary | ICD-10-CM | POA: Diagnosis not present

## 2019-05-11 ENCOUNTER — Other Ambulatory Visit: Payer: Self-pay

## 2019-05-11 ENCOUNTER — Telehealth: Payer: Self-pay | Admitting: *Deleted

## 2019-05-11 ENCOUNTER — Encounter: Payer: Self-pay | Admitting: Physician Assistant

## 2019-05-11 ENCOUNTER — Ambulatory Visit (INDEPENDENT_AMBULATORY_CARE_PROVIDER_SITE_OTHER): Payer: Medicare Other | Admitting: Physician Assistant

## 2019-05-11 VITALS — Temp 97.9°F

## 2019-05-11 DIAGNOSIS — R300 Dysuria: Secondary | ICD-10-CM

## 2019-05-11 DIAGNOSIS — L89156 Pressure-induced deep tissue damage of sacral region: Secondary | ICD-10-CM

## 2019-05-11 MED ORDER — CEPHALEXIN 250 MG/5ML PO SUSR: 500.0000 mg | Freq: Two times a day (BID) | ORAL | 0 refills | Status: DC

## 2019-05-11 MED ORDER — CEPHALEXIN 500 MG PO CAPS: 500.0000 mg | ORAL_CAPSULE | Freq: Two times a day (BID) | ORAL | 0 refills | Status: DC

## 2019-05-11 NOTE — Telephone Encounter (Signed)
Do they have home health nursing that can address the pressure sores?  Bc a urine sample will not help me assess or treat the sores. And given that they are concerned for UTI and there are other issues going on, I would suggest a VV (if i'm full, with Einar Pheasant) to assess

## 2019-05-11 NOTE — Progress Notes (Signed)
Virtual Visit via Video   I connected with patient on 05/11/19 at 11:30 AM EDT by a video enabled telemedicine application and verified that I am speaking with the correct person using two identifiers.  Location patient: Home Location provider: [facility], Office Persons participating in the virtual visit: Patient, Provider, [place] (Patina Moore)  I discussed the limitations of evaluation and management by telemedicine and the availability of in person appointments. The patient expressed understanding and agreed to proceed.  Subjective:   HPI:   Patient and husband present today via Doxy.Me to discuss acute concerns. Patient with history of parkinson's and parkinsonian dementia, hypothyroidism, UTI.  Husband and patient endorses a couple of days of dysuria, some increased in frequency for the past couple of nights. Notes some low-grade fever yesterday but not today. Notes loose stools recently over the past few days prior to onset of these symptoms. They are working on hydration but note she is not the best. Notes some mild anorexia the past couple of days. Denies vomiting, flank pain.   Endorses ulcer of sacral area present x a few weeks. Has history of sacral ulcers off and on due to her bed bound status.  Was initially red and swollen without drainage but now is pink but is still painful. Husband tries to keep her off the area as much as possible but this is hard.   ROS:   See pertinent positives and negatives per HPI.  Patient Active Problem List   Diagnosis Date Noted  . Dysphagia, neurologic 11/19/2018  . Hematuria 11/19/2018  . 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 Memory loss 12/13/2014  . Soft tissue mass 12/13/2014  . Recent skin changes 12/13/2014  . Hx of adenomatous colonic polyps 08/18/2014  . Fungal dermatitis 03/19/2014  . Overweight 03/19/2014  . Cataract   . Constipation, slow transit 09/03/2012  . Seasonal allergic rhinitis 04/18/2012  .  General medical examination 05/18/2011  . Elevated BP 03/13/2011  . Vaginal dryness, menopausal 03/13/2011  . Vitamin D deficiency 03/13/2011  . Hypertriglyceridemia 03/13/2011  . OSTEOPENIA 05/31/2010  . Parkinsons disease ([place]) 04/07/2010  . POSTMENOPAUSAL STATUS 04/07/2010  . 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 DRY MOUTH 03/07/2010  . 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 PARESTHESIA 01/26/2009  . Hypothyroidism 10/20/2008  . 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 Smoking status: Former Smoker    Types: Cigarettes    Quit date: 01/01/2001    Years since quitting: 18.3  . Smokeless tobacco: Never Used  Substance Use Topics  . Alcohol use: No    Alcohol/week: 12.0 - 14.0 standard drinks    Types: 12 - 14 Glasses of wine per week    Comment:  occ glass of wine     Current Outpatient Medications:  .  bisacodyl (DULCOLAX) 10 MG suppository, Place 1 suppository (10 mg total) rectally daily as needed for moderate constipation., Disp: 12 suppository, Rfl: 0 .  Carbidopa-Levodopa ER (SINEMET CR) 25-100 MG tablet controlled release, Take 1 tablet 4 (four) times daily by mouth., Disp: , Rfl: 98 .  cetirizine (ZYRTEC) 10 MG tablet, Take 1 tablet (10 mg total) by mouth daily., Disp: 30 tablet, Rfl: 11 .  cholecalciferol (VITAMIN D) 1000 units tablet, Take 2,000 Units by mouth daily. Last dose per husband on 02/26/2019, Disp: , Rfl:  .  donepezil (ARICEPT) 5 MG tablet, Take 5 mg by mouth every evening. , Disp: , Rfl: 0 .  fluticasone (FLONASE) 50 MCG/ACT nasal spray, SHAKE LIQUID AND USE 2 SPRAYS IN EACH NOSTRIL DAILY, Disp: 16 g, Rfl: 6 .  levothyroxine (SYNTHROID, LEVOTHROID) 75 MCG tablet, TAKE 1 TABLET(75 MCG) BY MOUTH DAILY, Disp: 90 tablet, Rfl: 0 .  mirtazapine (REMERON) 30 MG tablet, Take 1 tablet (30 mg total) by mouth at bedtime., Disp: , Rfl:  .  Pimavanserin Tartrate (NUPLAZID) 34 MG CAPS, Take 34 mg by mouth. Patient takes in the pm, Disp: , Rfl:  .   cephALEXin (KEFLEX) 500 MG capsule, Take 1 capsule (500 mg total) by mouth 2 (two) times daily for 7 days., Disp: 14 capsule, Rfl: 0  Allergies  Allergen Reactions  . Codeine Nausea And Vomiting    REACTION: sick  . Ether Other (See Comments)    VERY DIZZY  . Gabapentin Nausea And Vomiting and Other (See Comments)    REACTION: hallucinations, dizziness and vomiting  . Iodine Hives    HIVES  . Bactrim [Sulfamethoxazole-Trimethoprim] Rash    Rash on shoulder and soft stools   . Iodinated Diagnostic Agents Rash    Objective:   Temp 97.9 F (36.6 C) (Skin)   Patient is well-developed, well-nourished in no acute distress.  Resting comfortably in bed at home.  Head is normocephalic, atraumatic.  No labored breathing.  Speech is clear and coherent with logical contest.  Patient is alert and oriented at baseline - dementia.   Assessment and Plan:   1. Dysuria Orders for UA and culture placed. Patient's husband to pick up sterile cup to obtain sample and bring in for testing. Will start empiric treatment with Keflex. Will alter based on culture. Strict ER precautions reviewed with patient.   - cephALEXin (KEFLEX) 500 MG capsule; Take 1 capsule (500 mg total) by mouth 2 (two) times daily for 7 days.  Dispense: 14 capsule; Refill: 0 - Urinalysis, Routine w reflex microscopic - Urine Culture  2. 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 Will send out [facility] RN to assess further and to provide wound care. Recommend gel overlay for bed as they only have one for the chair. Will send DME order.  - Ambulatory referral to [place], [place] 05/11/2019

## 2019-05-11 NOTE — Telephone Encounter (Signed)
Patient's husband called and left a VM that pt is having symptoms of UTI again.  He is asking if he can bring in a urine sample.   He also states that she has an open pressure ulcer on her bottom.     Routing to PCP to advise.

## 2019-05-11 NOTE — Progress Notes (Signed)
I have discussed the procedure for the virtual visit with the patient who has given consent to proceed with assessment and treatment.   Jimel Myler S Carmin Dibartolo, CMA     

## 2019-05-11 NOTE — Telephone Encounter (Signed)
I have scheduled patient virtual appointment with [facility] this morning.

## 2019-05-12 ENCOUNTER — Other Ambulatory Visit: Payer: Medicare Other

## 2019-05-12 ENCOUNTER — Other Ambulatory Visit (INDEPENDENT_AMBULATORY_CARE_PROVIDER_SITE_OTHER): Payer: Medicare Other

## 2019-05-12 DIAGNOSIS — R300 Dysuria: Secondary | ICD-10-CM | POA: Diagnosis not present

## 2019-05-12 LAB — URINALYSIS, ROUTINE W REFLEX MICROSCOPIC
Ketones, ur: 15 — AB
Leukocytes,Ua: NEGATIVE
Nitrite: NEGATIVE
Specific Gravity, Urine: 1.025 (ref 1.000–1.030)
Total Protein, Urine: 30 — AB
Urine Glucose: NEGATIVE
Urobilinogen, UA: 0.2 (ref 0.0–1.0)
pH: 6.5 (ref 5.0–8.0)

## 2019-05-13 ENCOUNTER — Encounter: Payer: Self-pay | Admitting: Physician Assistant

## 2019-05-13 LAB — URINE CULTURE
MICRO NUMBER:: 617263
SPECIMEN QUALITY:: ADEQUATE

## 2019-05-13 NOTE — Telephone Encounter (Signed)
Can we look into this please? Thank you.

## 2019-05-14 ENCOUNTER — Telehealth: Payer: Self-pay | Admitting: Family Medicine

## 2019-05-14 ENCOUNTER — Other Ambulatory Visit: Payer: Self-pay | Admitting: Family Medicine

## 2019-05-14 LAB — URINE CULTURE
MICRO NUMBER:: 621305
SPECIMEN QUALITY:: ADEQUATE

## 2019-05-14 NOTE — Telephone Encounter (Signed)
OK for verbal order. Have them fax over written orders for PCP signature on her return next week.

## 2019-05-14 NOTE — Telephone Encounter (Signed)
Donnelly Angelica called in asking for verbal order for speech therapy to see pt 2X a wk for 3 wks  Please call linda with verbal at [phone].

## 2019-05-14 NOTE — Telephone Encounter (Signed)
Spoke with linda to give the information below.  Stated verbal understanding.

## 2019-05-17 DIAGNOSIS — F0280 Dementia in other diseases classified elsewhere without behavioral disturbance: Secondary | ICD-10-CM | POA: Diagnosis not present

## 2019-05-17 DIAGNOSIS — R300 Dysuria: Secondary | ICD-10-CM | POA: Diagnosis not present

## 2019-05-17 DIAGNOSIS — Z87440 Personal history of urinary (tract) infections: Secondary | ICD-10-CM | POA: Diagnosis not present

## 2019-05-17 DIAGNOSIS — G20 Parkinson's disease: Secondary | ICD-10-CM | POA: Diagnosis not present

## 2019-05-17 DIAGNOSIS — L89316 Pressure-induced deep tissue damage of right buttock: Secondary | ICD-10-CM | POA: Diagnosis not present

## 2019-05-17 DIAGNOSIS — Z87891 Personal history of nicotine dependence: Secondary | ICD-10-CM | POA: Diagnosis not present

## 2019-05-17 DIAGNOSIS — L89320 Pressure ulcer of left buttock, unstageable: Secondary | ICD-10-CM | POA: Diagnosis not present

## 2019-05-17 DIAGNOSIS — R1310 Dysphagia, unspecified: Secondary | ICD-10-CM | POA: Diagnosis not present

## 2019-05-21 DIAGNOSIS — R1310 Dysphagia, unspecified: Secondary | ICD-10-CM | POA: Diagnosis not present

## 2019-05-21 DIAGNOSIS — G20 Parkinson's disease: Secondary | ICD-10-CM | POA: Diagnosis not present

## 2019-05-21 DIAGNOSIS — L89320 Pressure ulcer of left buttock, unstageable: Secondary | ICD-10-CM | POA: Diagnosis not present

## 2019-05-21 DIAGNOSIS — L89316 Pressure-induced deep tissue damage of right buttock: Secondary | ICD-10-CM | POA: Diagnosis not present

## 2019-05-21 DIAGNOSIS — F0280 Dementia in other diseases classified elsewhere without behavioral disturbance: Secondary | ICD-10-CM | POA: Diagnosis not present

## 2019-05-21 DIAGNOSIS — R300 Dysuria: Secondary | ICD-10-CM | POA: Diagnosis not present

## 2019-05-27 DIAGNOSIS — N201 Calculus of ureter: Secondary | ICD-10-CM | POA: Diagnosis not present

## 2019-05-28 ENCOUNTER — Telehealth: Payer: Self-pay | Admitting: General Practice

## 2019-05-28 DIAGNOSIS — Z87440 Personal history of urinary (tract) infections: Secondary | ICD-10-CM | POA: Diagnosis not present

## 2019-05-28 DIAGNOSIS — L89156 Pressure-induced deep tissue damage of sacral region: Secondary | ICD-10-CM

## 2019-05-28 DIAGNOSIS — Z87891 Personal history of nicotine dependence: Secondary | ICD-10-CM | POA: Diagnosis not present

## 2019-05-28 DIAGNOSIS — R300 Dysuria: Secondary | ICD-10-CM | POA: Diagnosis not present

## 2019-05-28 DIAGNOSIS — G20 Parkinson's disease: Secondary | ICD-10-CM | POA: Diagnosis not present

## 2019-05-28 DIAGNOSIS — F0280 Dementia in other diseases classified elsewhere without behavioral disturbance: Secondary | ICD-10-CM | POA: Diagnosis not present

## 2019-05-28 DIAGNOSIS — L89316 Pressure-induced deep tissue damage of right buttock: Secondary | ICD-10-CM | POA: Diagnosis not present

## 2019-05-28 DIAGNOSIS — L89320 Pressure ulcer of left buttock, unstageable: Secondary | ICD-10-CM | POA: Diagnosis not present

## 2019-05-28 DIAGNOSIS — R1310 Dysphagia, unspecified: Secondary | ICD-10-CM | POA: Diagnosis not present

## 2019-05-28 NOTE — Telephone Encounter (Signed)
Ok for mattress and ongoing wound care

## 2019-05-28 NOTE — Telephone Encounter (Signed)
DME faxed and Donita called and orders given.

## 2019-05-28 NOTE — Telephone Encounter (Signed)
Received a phone call from [place] at [facility] asking if can have a low air loss mattress- due to deep tissue injury to her buttocks.   Also would like orders for continued care to treat wound to her buttocks.   Please fax DME order to [phone].

## 2019-05-28 NOTE — Addendum Note (Signed)
Addended by: Davis Gourd on: 05/28/2019 11:18 AM   Modules accepted: Orders

## 2019-05-29 ENCOUNTER — Telehealth: Payer: Self-pay | Admitting: Family Medicine

## 2019-05-29 NOTE — Telephone Encounter (Signed)
Ok for orders? 

## 2019-05-29 NOTE — Telephone Encounter (Signed)
[facility] is calling and needs PT order for evaluation

## 2019-05-29 NOTE — Telephone Encounter (Signed)
Called and gave the verbal ok.

## 2019-06-01 ENCOUNTER — Encounter: Payer: Self-pay | Admitting: Family Medicine

## 2019-06-02 ENCOUNTER — Encounter: Payer: Self-pay | Admitting: Family Medicine

## 2019-06-03 ENCOUNTER — Telehealth: Payer: Self-pay | Admitting: General Practice

## 2019-06-03 DIAGNOSIS — L89320 Pressure ulcer of left buttock, unstageable: Secondary | ICD-10-CM | POA: Diagnosis not present

## 2019-06-03 DIAGNOSIS — L89316 Pressure-induced deep tissue damage of right buttock: Secondary | ICD-10-CM | POA: Diagnosis not present

## 2019-06-03 DIAGNOSIS — R300 Dysuria: Secondary | ICD-10-CM | POA: Diagnosis not present

## 2019-06-03 DIAGNOSIS — G20 Parkinson's disease: Secondary | ICD-10-CM | POA: Diagnosis not present

## 2019-06-03 DIAGNOSIS — F0280 Dementia in other diseases classified elsewhere without behavioral disturbance: Secondary | ICD-10-CM | POA: Diagnosis not present

## 2019-06-03 DIAGNOSIS — R1310 Dysphagia, unspecified: Secondary | ICD-10-CM | POA: Diagnosis not present

## 2019-06-03 NOTE — Telephone Encounter (Signed)
Ok for orders? 

## 2019-06-03 NOTE — Telephone Encounter (Signed)
Called and verbal ok given.  

## 2019-06-03 NOTE — Telephone Encounter (Signed)
Melissa Douglas with [facility] PT called requesting vrbal orders for PT 1x week/1 week, 1x week/2 weeks due to pt eating less and having less strength, balance and mobility.   [place] for orders?

## 2019-06-09 DIAGNOSIS — F0280 Dementia in other diseases classified elsewhere without behavioral disturbance: Secondary | ICD-10-CM | POA: Diagnosis not present

## 2019-06-09 DIAGNOSIS — R1310 Dysphagia, unspecified: Secondary | ICD-10-CM | POA: Diagnosis not present

## 2019-06-09 DIAGNOSIS — L89316 Pressure-induced deep tissue damage of right buttock: Secondary | ICD-10-CM | POA: Diagnosis not present

## 2019-06-09 DIAGNOSIS — L89320 Pressure ulcer of left buttock, unstageable: Secondary | ICD-10-CM | POA: Diagnosis not present

## 2019-06-09 DIAGNOSIS — G20 Parkinson's disease: Secondary | ICD-10-CM | POA: Diagnosis not present

## 2019-06-09 DIAGNOSIS — R300 Dysuria: Secondary | ICD-10-CM | POA: Diagnosis not present

## 2019-06-11 ENCOUNTER — Encounter: Payer: Self-pay | Admitting: Family Medicine

## 2019-06-11 DIAGNOSIS — L89320 Pressure ulcer of left buttock, unstageable: Secondary | ICD-10-CM | POA: Diagnosis not present

## 2019-06-11 DIAGNOSIS — R300 Dysuria: Secondary | ICD-10-CM | POA: Diagnosis not present

## 2019-06-11 DIAGNOSIS — G20 Parkinson's disease: Secondary | ICD-10-CM | POA: Diagnosis not present

## 2019-06-11 DIAGNOSIS — L89316 Pressure-induced deep tissue damage of right buttock: Secondary | ICD-10-CM | POA: Diagnosis not present

## 2019-06-11 DIAGNOSIS — F0280 Dementia in other diseases classified elsewhere without behavioral disturbance: Secondary | ICD-10-CM | POA: Diagnosis not present

## 2019-06-11 DIAGNOSIS — R1310 Dysphagia, unspecified: Secondary | ICD-10-CM | POA: Diagnosis not present

## 2019-06-12 ENCOUNTER — Telehealth: Payer: Self-pay | Admitting: Family Medicine

## 2019-06-12 DIAGNOSIS — L89320 Pressure ulcer of left buttock, unstageable: Secondary | ICD-10-CM | POA: Diagnosis not present

## 2019-06-12 DIAGNOSIS — R1310 Dysphagia, unspecified: Secondary | ICD-10-CM | POA: Diagnosis not present

## 2019-06-12 DIAGNOSIS — F0280 Dementia in other diseases classified elsewhere without behavioral disturbance: Secondary | ICD-10-CM | POA: Diagnosis not present

## 2019-06-12 DIAGNOSIS — L89326 Pressure-induced deep tissue damage of left buttock: Secondary | ICD-10-CM

## 2019-06-12 DIAGNOSIS — G20 Parkinson's disease: Secondary | ICD-10-CM | POA: Diagnosis not present

## 2019-06-12 DIAGNOSIS — R300 Dysuria: Secondary | ICD-10-CM | POA: Diagnosis not present

## 2019-06-12 DIAGNOSIS — L89316 Pressure-induced deep tissue damage of right buttock: Secondary | ICD-10-CM | POA: Diagnosis not present

## 2019-06-12 NOTE — Telephone Encounter (Signed)
Donita RN  is call and needs order fax to [place] for low air loss mattress fax number [phone] and another fax number [phone].  Dx codes are [phone] and [phone]

## 2019-06-12 NOTE — Telephone Encounter (Signed)
Ok for order?  

## 2019-06-12 NOTE — Telephone Encounter (Signed)
DME printed and faxed.

## 2019-06-12 NOTE — Telephone Encounter (Signed)
Ok to print a new DME to send to this new company

## 2019-06-15 ENCOUNTER — Telehealth: Payer: Self-pay

## 2019-06-15 DIAGNOSIS — R1310 Dysphagia, unspecified: Secondary | ICD-10-CM | POA: Diagnosis not present

## 2019-06-15 DIAGNOSIS — L89316 Pressure-induced deep tissue damage of right buttock: Secondary | ICD-10-CM | POA: Diagnosis not present

## 2019-06-15 DIAGNOSIS — F0280 Dementia in other diseases classified elsewhere without behavioral disturbance: Secondary | ICD-10-CM | POA: Diagnosis not present

## 2019-06-15 DIAGNOSIS — L89320 Pressure ulcer of left buttock, unstageable: Secondary | ICD-10-CM | POA: Diagnosis not present

## 2019-06-15 DIAGNOSIS — G20 Parkinson's disease: Secondary | ICD-10-CM | POA: Diagnosis not present

## 2019-06-15 DIAGNOSIS — R300 Dysuria: Secondary | ICD-10-CM | POA: Diagnosis not present

## 2019-06-15 NOTE — Telephone Encounter (Signed)
absolutely

## 2019-06-15 NOTE — Telephone Encounter (Signed)
Melissa Douglas [phone] from [facility] called in requesting verbal order for Palliative Care. Please advise

## 2019-06-16 ENCOUNTER — Telehealth: Payer: Self-pay | Admitting: Licensed Clinical Social Worker

## 2019-06-16 ENCOUNTER — Telehealth: Payer: Self-pay | Admitting: Family Medicine

## 2019-06-16 ENCOUNTER — Other Ambulatory Visit: Payer: Self-pay

## 2019-06-16 DIAGNOSIS — L89320 Pressure ulcer of left buttock, unstageable: Secondary | ICD-10-CM | POA: Diagnosis not present

## 2019-06-16 DIAGNOSIS — G20 Parkinson's disease: Secondary | ICD-10-CM | POA: Diagnosis not present

## 2019-06-16 DIAGNOSIS — R300 Dysuria: Secondary | ICD-10-CM | POA: Diagnosis not present

## 2019-06-16 DIAGNOSIS — L89316 Pressure-induced deep tissue damage of right buttock: Secondary | ICD-10-CM | POA: Diagnosis not present

## 2019-06-16 DIAGNOSIS — R1310 Dysphagia, unspecified: Secondary | ICD-10-CM | POA: Diagnosis not present

## 2019-06-16 DIAGNOSIS — F0280 Dementia in other diseases classified elsewhere without behavioral disturbance: Secondary | ICD-10-CM | POA: Diagnosis not present

## 2019-06-16 DIAGNOSIS — Z87440 Personal history of urinary (tract) infections: Secondary | ICD-10-CM | POA: Diagnosis not present

## 2019-06-16 DIAGNOSIS — Z87891 Personal history of nicotine dependence: Secondary | ICD-10-CM | POA: Diagnosis not present

## 2019-06-16 MED ORDER — SANTYL 250 UNIT/GM EX OINT: 1.0000 "application " | TOPICAL_OINTMENT | Freq: Every day | CUTANEOUS | 1 refills | Status: AC

## 2019-06-16 NOTE — Telephone Encounter (Signed)
Home Health Verbal Orders - Caller/Agency: [facility]/ [facility] Callback Number: [phone] secure line  Requesting OT/PT/Skilled Nursing/Social Work/Speech Therapy: clean with normal saline, apply santyl to wound bed, and wet to dry dressing. If approved will need a prescription called in for santyl. Frequency: 1x a day

## 2019-06-16 NOTE — Telephone Encounter (Signed)
Verbal ok given.

## 2019-06-16 NOTE — Telephone Encounter (Signed)
Verbal order granted in PCP absence.

## 2019-06-16 NOTE — Telephone Encounter (Signed)
Please advise 

## 2019-06-16 NOTE — Telephone Encounter (Signed)
Palliative Care SW spoke with patient's husband, [name], and scheduled a home visit for 11am, on Thursday, 06/18/19.

## 2019-06-16 NOTE — Telephone Encounter (Signed)
Verbal ordered given to [place] with [facility] and script for Santyl 15 g with 1 refill sent to pharmacy

## 2019-06-18 ENCOUNTER — Other Ambulatory Visit: Payer: Self-pay | Admitting: *Deleted

## 2019-06-18 ENCOUNTER — Other Ambulatory Visit: Payer: Self-pay

## 2019-06-18 ENCOUNTER — Other Ambulatory Visit: Payer: Self-pay | Admitting: Licensed Clinical Social Worker

## 2019-06-18 DIAGNOSIS — Z515 Encounter for palliative care: Secondary | ICD-10-CM

## 2019-06-19 DIAGNOSIS — L89316 Pressure-induced deep tissue damage of right buttock: Secondary | ICD-10-CM | POA: Diagnosis not present

## 2019-06-19 DIAGNOSIS — L89320 Pressure ulcer of left buttock, unstageable: Secondary | ICD-10-CM | POA: Diagnosis not present

## 2019-06-19 DIAGNOSIS — R300 Dysuria: Secondary | ICD-10-CM | POA: Diagnosis not present

## 2019-06-19 DIAGNOSIS — R1310 Dysphagia, unspecified: Secondary | ICD-10-CM | POA: Diagnosis not present

## 2019-06-19 DIAGNOSIS — F0280 Dementia in other diseases classified elsewhere without behavioral disturbance: Secondary | ICD-10-CM | POA: Diagnosis not present

## 2019-06-19 DIAGNOSIS — G20 Parkinson's disease: Secondary | ICD-10-CM | POA: Diagnosis not present

## 2019-06-19 NOTE — Progress Notes (Signed)
COMMUNITY PALLIATIVE CARE SW NOTE  PATIENT NAME: Melissa Douglas DOB: [birth year] MRN: [id number]  PRIMARY CARE PROVIDER: Midge Minium, MD  RESPONSIBLE PARTY:  Acct ID - Guarantor Home Phone Work Phone Relationship Acct Type  [id number] - [place],* [phone]  Self P/F     [address] [place], [place] [address]     PLAN OF CARE and INTERVENTIONS:             1. GOALS OF CARE/ ADVANCE CARE PLANNING:  Patient's husband, [name], wishes for patient to receive the best care possible.  He would like patient to be a DNR.  SW to mail. 2. SOCIAL/EMOTIONAL/SPIRITUAL ASSESSMENT/ INTERVENTIONS:  SW and Palliative Care RN, Daryl Eastern, met with patient and her husband, [name], in their home.  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  Patient has three children from her previous marriage that do not live locally.  The couple moved to [place] about 20 years ago and have been married about 30 years.  Patient was diagnosed 7 years ago with Parkinson's.  During the visit, patient was in her hospital bed.  Her verbalizations were very difficult to understand.  She complained of pain in her shoulders and [name] was able to readjust her pillows.  [name] had a soft cast on his foot due to a torn achilles, but was able to care for her and appeared very attentive during the visit.  She is on the waiting list at [facility].  She sundowns for a few hours in the evening.  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  3. PATIENT/CAREGIVER EDUCATION/ COPING:  Provided education regarding the Palliative Care program.  Patient and her husband express their feelings openly. 4. PERSONAL EMERGENCY PLAN:  Husband will call patient's MD. 5. COMMUNITY RESOURCES COORDINATION/ HEALTH CARE NAVIGATION:  Patient has private caregivers from 8a-4p Monday through Saturday.  Patient is currently receiving care from a RN one time a week and PT two times per week. 6. FINANCIAL/LEGAL CONCERNS/INTERVENTIONS:  None.     SOCIAL  HX:  Social History   Tobacco Use  . Smoking status: Former Smoker    Types: Cigarettes    Quit date: 01/01/2001    Years since quitting: 18.4  . Smokeless tobacco: Never Used  Substance Use Topics  . Alcohol use: No    Alcohol/week: 12.0 - 14.0 standard drinks    Types: 12 - 14 Glasses of wine per week    Comment:  occ glass of wine     CODE STATUS:  DNR  ADVANCED DIRECTIVES: Yes MOST FORM COMPLETE:  No HOSPICE EDUCATION PROVIDED:  Yes PPS:  Patient's appetite has declined.  She can stand and sit with assistance.  She uses a walker. Duration of visit and documentation:  90 minutes.      Melissa Corn Nagee Goates, LCSW

## 2019-06-22 DIAGNOSIS — R300 Dysuria: Secondary | ICD-10-CM | POA: Diagnosis not present

## 2019-06-22 DIAGNOSIS — L89320 Pressure ulcer of left buttock, unstageable: Secondary | ICD-10-CM | POA: Diagnosis not present

## 2019-06-22 DIAGNOSIS — R1310 Dysphagia, unspecified: Secondary | ICD-10-CM | POA: Diagnosis not present

## 2019-06-22 DIAGNOSIS — L89316 Pressure-induced deep tissue damage of right buttock: Secondary | ICD-10-CM | POA: Diagnosis not present

## 2019-06-22 DIAGNOSIS — F0280 Dementia in other diseases classified elsewhere without behavioral disturbance: Secondary | ICD-10-CM | POA: Diagnosis not present

## 2019-06-22 DIAGNOSIS — G20 Parkinson's disease: Secondary | ICD-10-CM | POA: Diagnosis not present

## 2019-06-22 NOTE — Progress Notes (Signed)
COMMUNITY PALLIATIVE CARE RN NTOE  PATIENT NAME: Melissa Douglas DOB: [birth year] MRN: [id number]  PRIMARY CARE PROVIDER: Midge Minium, MD  RESPONSIBLE PARTY:  Acct ID - Guarantor Home Phone Work Phone Relationship Acct Type  [id number] [facility],* [phone]  Self P/F     [address], [place], [place] [address]   Covid-19 Pre-screening Negative  PLAN OF CARE and INTERVENTION:  1. ADVANCE CARE PLANNING/GOALS OF CARE: Goal is to remain at home with her husband. He requests a DNR form. 2. PATIENT/CAREGIVER EDUCATION: Explained Palliative Care services 3. DISEASE STATUS: Joint visit made with Palliative Care SW, Lynn Duffy. Met with patient and her husband, [name], in their home. Upon arrival, patient is lying in bed awake. Speech is difficult to understand. She is experiencing increased confusion in the evenings, e.g needing to pack and leave the home. L arm tremor noted at times throughout visit.  She reports pain in her L shoulder and began moaning. Pain improved with repositioning. She also takes PRN Tylenol and Norco. She does sleep often throughout the day and fell asleep on and off during visit. She requires 1 person assistance with bathing, transfers, standing and toileting. She is currently working with in home PT 2x/week and RN 1x/week. PT has been working on standing and sitting exercises in her wheelchair and transfers. They utilize a gait belt. Her L calf/foot is very still and rigid and husband performs ROM exercises often. She also receives Botox every 3 months in her L calf. Husband also reports R hand stiffness. She has a poor appetite. She is currently taking Remeron, but husband states it is not helping. She usually eats breakfast, but may not eat lunch or dinner. They have been giving her nutritional supplements. She does have some dysphagia. He has started giving her a mechanical soft diet. She remains on thin liquids. Her medications are crushed and given in applesauce.  She has not had a BM in 8 days. He has given Dulcolax tabs w/o results. Recommended Dulcolax suppositories, but he is opting to try Magnesium Citrate first to see if this will help. She is intermittently incontinent of bowel and bladder. She has an ulcer to her bottom. They are currently using barrier cream, but home health RN is to visit tomorrow to evaluate and treat area. She has hired Comptroller from 8a-4p. Husband agreeable to future Palliative Care visits. Will continue to monitor.  HISTORY OF PRESENT ILLNESS:  This is a 78 yo female who resides at home with her husband. Palliative Care referral made for advanced care planning and goals of care. Will visit patient monthly and PRN.  CODE STATUS: Husband requesting a DNR. SW to mail patient a signed copy. ADVANCED DIRECTIVES: Y MOST FORM: no (form left in the home for husband to review further) PPS: 30%   PHYSICAL EXAM:   LUNGS: clear to auscultation  CARDIAC: Cor RRR EXTREMITIES: No edema SKIN: Ulcer present on bottom, being treated by home health RN  NEURO: Alert and oriented x 2, speech difficult to understand, generalized weakness, requires 1 person assistance with transfers   (Duration of visit and documentation 90 minutes)   Daryl Eastern, RN BSN

## 2019-06-25 ENCOUNTER — Ambulatory Visit: Payer: Self-pay | Admitting: Family Medicine

## 2019-06-25 ENCOUNTER — Encounter: Payer: Self-pay | Admitting: Family Medicine

## 2019-06-25 DIAGNOSIS — L89316 Pressure-induced deep tissue damage of right buttock: Secondary | ICD-10-CM | POA: Diagnosis not present

## 2019-06-25 DIAGNOSIS — R1310 Dysphagia, unspecified: Secondary | ICD-10-CM | POA: Diagnosis not present

## 2019-06-25 DIAGNOSIS — G20 Parkinson's disease: Secondary | ICD-10-CM | POA: Diagnosis not present

## 2019-06-25 DIAGNOSIS — L89320 Pressure ulcer of left buttock, unstageable: Secondary | ICD-10-CM | POA: Diagnosis not present

## 2019-06-25 DIAGNOSIS — F0280 Dementia in other diseases classified elsewhere without behavioral disturbance: Secondary | ICD-10-CM | POA: Diagnosis not present

## 2019-06-25 DIAGNOSIS — R300 Dysuria: Secondary | ICD-10-CM | POA: Diagnosis not present

## 2019-06-25 NOTE — Telephone Encounter (Signed)
Pt.'s husband called to report he read Dr. Virgil Benedict My Chart reply, and will take pt. To the ED in the morning for evaluation for her wound care and feeding tube placement. States "she is drinking water still, but that is about it."

## 2019-06-25 NOTE — Telephone Encounter (Signed)
FYI

## 2019-06-26 ENCOUNTER — Emergency Department (HOSPITAL_COMMUNITY)
Admission: EM | Admit: 2019-06-26 | Discharge: 2019-06-26 | Disposition: A | Discharge status: Home / Self Care | Payer: Medicare Other | Attending: Emergency Medicine | Admitting: Emergency Medicine

## 2019-06-26 ENCOUNTER — Encounter (HOSPITAL_COMMUNITY): Payer: Self-pay | Admitting: Emergency Medicine

## 2019-06-26 ENCOUNTER — Telehealth: Payer: Self-pay

## 2019-06-26 ENCOUNTER — Other Ambulatory Visit: Payer: Self-pay

## 2019-06-26 DIAGNOSIS — E876 Hypokalemia: Secondary | ICD-10-CM

## 2019-06-26 DIAGNOSIS — Z87891 Personal history of nicotine dependence: Secondary | ICD-10-CM | POA: Insufficient documentation

## 2019-06-26 DIAGNOSIS — L89154 Pressure ulcer of sacral region, stage 4: Secondary | ICD-10-CM

## 2019-06-26 DIAGNOSIS — E039 Hypothyroidism, unspecified: Secondary | ICD-10-CM | POA: Diagnosis not present

## 2019-06-26 DIAGNOSIS — G20 Parkinson's disease: Secondary | ICD-10-CM

## 2019-06-26 DIAGNOSIS — Z79899 Other long term (current) drug therapy: Secondary | ICD-10-CM | POA: Diagnosis not present

## 2019-06-26 DIAGNOSIS — F0390 Unspecified dementia without behavioral disturbance: Secondary | ICD-10-CM | POA: Insufficient documentation

## 2019-06-26 DIAGNOSIS — R627 Adult failure to thrive: Secondary | ICD-10-CM

## 2019-06-26 DIAGNOSIS — Z4800 Encounter for change or removal of nonsurgical wound dressing: Secondary | ICD-10-CM | POA: Diagnosis present

## 2019-06-26 LAB — CBC WITH DIFFERENTIAL/PLATELET
Abs Immature Granulocytes: 0.02 10*3/uL (ref 0.00–0.07)
Basophils Absolute: 0 10*3/uL (ref 0.0–0.1)
Basophils Relative: 1 %
Eosinophils Absolute: 0.1 10*3/uL (ref 0.0–0.5)
Eosinophils Relative: 2 %
HCT: 40.2 % (ref 36.0–46.0)
Hemoglobin: 12.5 g/dL (ref 12.0–15.0)
Immature Granulocytes: 0 %
Lymphocytes Relative: 18 %
Lymphs Abs: 1.1 10*3/uL (ref 0.7–4.0)
MCH: 26.2 pg (ref 26.0–34.0)
MCHC: 31.1 g/dL (ref 30.0–36.0)
MCV: 84.1 fL (ref 80.0–100.0)
Monocytes Absolute: 0.6 10*3/uL (ref 0.1–1.0)
Monocytes Relative: 9 %
Neutro Abs: 4.6 10*3/uL (ref 1.7–7.7)
Neutrophils Relative %: 70 %
Platelets: 325 10*3/uL (ref 150–400)
RBC: 4.78 MIL/uL (ref 3.87–5.11)
RDW: 14.3 % (ref 11.5–15.5)
WBC: 6.4 10*3/uL (ref 4.0–10.5)
nRBC: 0 % (ref 0.0–0.2)

## 2019-06-26 LAB — COMPREHENSIVE METABOLIC PANEL
ALT: 6 U/L (ref 0–44)
AST: 15 U/L (ref 15–41)
Albumin: 3.6 g/dL (ref 3.5–5.0)
Alkaline Phosphatase: 55 U/L (ref 38–126)
Anion gap: 12 (ref 5–15)
BUN: 10 mg/dL (ref 8–23)
CO2: 25 mmol/L (ref 22–32)
Calcium: 9.8 mg/dL (ref 8.9–10.3)
Chloride: 100 mmol/L (ref 98–111)
Creatinine, Ser: 0.68 mg/dL (ref 0.44–1.00)
GFR calc Af Amer: >60 mL/min (ref >60)
GFR calc non Af Amer: >60 mL/min (ref >60)
Glucose, Bld: 100 mg/dL — ABNORMAL HIGH (ref 70–99)
Potassium: 2.9 mmol/L — ABNORMAL LOW (ref 3.5–5.1)
Sodium: 137 mmol/L (ref 135–145)
Total Bilirubin: 0.8 mg/dL (ref 0.3–1.2)
Total Protein: 6.7 g/dL (ref 6.5–8.1)

## 2019-06-26 LAB — LACTIC ACID, PLASMA: Lactic Acid, Venous: 1.7 mmol/L (ref 0.5–1.9)

## 2019-06-26 MED ORDER — HYDROCODONE-ACETAMINOPHEN 5-325 MG PO TABS: 1.0000 | ORAL_TABLET | ORAL | 0 refills | Status: AC | PRN

## 2019-06-26 MED ORDER — PROCHLORPERAZINE 25 MG RE SUPP: 25.0000 mg | Freq: Two times a day (BID) | RECTAL | 0 refills | Status: AC | PRN

## 2019-06-26 MED ORDER — SODIUM CHLORIDE 0.9% FLUSH: 3.0000 mL | Freq: Once | INTRAVENOUS | Status: DC

## 2019-06-26 MED ORDER — POTASSIUM CHLORIDE CRYS ER 20 MEQ PO TBCR
20.0000 meq | EXTENDED_RELEASE_TABLET | Freq: Once | ORAL | Status: AC
  Administered 2019-06-26: 20 meq via ORAL
  Filled 2019-06-26: qty 1

## 2019-06-26 NOTE — Telephone Encounter (Signed)
Melissa Douglas with [facility] called in wanting to inform PCP of a missed therapy session today. Patient's husband was taking her to an appt with general surgeon to have the wound on her buttocks evaluated and possibly discuss poor nutrition.   Call back # [phone]

## 2019-06-26 NOTE — ED Provider Notes (Signed)
[facility] EMERGENCY DEPARTMENT Provider Note   CSN: [id number] Arrival date & time: 06/26/19  1023     History   Chief Complaint Chief Complaint  Patient presents with  . Wound Check  . Weight Loss    HPI Melissa Douglas is a 78 y.o. female.  She has a history of Parkinson's disease and is fairly well debilitated wheelchair-bound.  Her husband cares for her at home and they have some services and she is also palliative care.  She is had a sacral wound for over a month and has been getting worse despite the husband doing packing and some local wound care.  He says she is not eating any food and only drinking some water.  He still able to get her medications into her.  He is talk to the PCP and the PC recommended he come to the emergency department for evaluation of the wound and for consideration for a feeding tube.  She denies any headache chest pain shortness of breath or abdominal pain.  She has a history of dementia level 5 caveat.     The history is provided by the patient and the spouse.  Wound Check This is a new problem. Episode onset: 1 month. The problem occurs constantly. The problem has not changed since onset.Pertinent negatives include no chest pain, no abdominal pain, no headaches and no shortness of breath. Exacerbated by: pressure. Nothing relieves the symptoms. She has tried nothing for the symptoms. The treatment provided no relief.    Past Medical History:  Diagnosis Date  . Anxiety   . Cataract Right eye   HAD SURGERY  . Chronic joint pain   . Dementia ([place])   . Depression   . History of kidney stones   . Hx of adenomatous colonic polyps 08/18/2014  . Hypothyroidism   . Incontinence    Wears depends   . Multiple nasal polyps   . Osteopenia   . Parkinson disease ([place])   . Phlegm in throat    heavy phlegm in throat per husband, wet cough, has swallowing issues , no particular diet   . Pneumonia    ? aspirastion pneumonia      Patient Active Problem List   Diagnosis Date Noted  . Dysphagia, neurologic 11/19/2018  . Hematuria 11/19/2018  . 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 Memory loss 12/13/2014  . Soft tissue mass 12/13/2014  . Recent skin changes 12/13/2014  . Hx of adenomatous colonic polyps 08/18/2014  . Fungal dermatitis 03/19/2014  . Overweight 03/19/2014  . Cataract   . Constipation, slow transit 09/03/2012  . Seasonal allergic rhinitis 04/18/2012  . General medical examination 05/18/2011  . Elevated BP 03/13/2011  . Vaginal dryness, menopausal 03/13/2011  . Vitamin D deficiency 03/13/2011  . Hypertriglyceridemia 03/13/2011  . OSTEOPENIA 05/31/2010  . Parkinsons disease ([place]) 04/07/2010  . POSTMENOPAUSAL STATUS 04/07/2010  . 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 DRY MOUTH 03/07/2010  . 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 PARESTHESIA 01/26/2009  . Hypothyroidism 10/20/2008  . 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 ABDOMINAL HYSTERECTOMY     ovaries remain  . APPENDECTOMY    . CATARACT EXTRACTION    . EXTRACORPOREAL SHOCK WAVE LITHOTRIPSY Left 03/12/2019   Procedure: EXTRACORPOREAL SHOCK WAVE LITHOTRIPSY (ESWL);  Surgeon: Raynelle Bring, MD;  Location: [facility] ORS;  Service: Urology;  Laterality: Left;  . TONSILLECTOMY       OB History  No obstetric history on file.      Home Medications    Prior to Admission medications   Medication Sig Start Date End Date Taking? Authorizing Provider  bisacodyl (DULCOLAX) 10 MG suppository Place 1 suppository (10 mg total) rectally daily as needed for moderate constipation. 01/13/19   Nche, Charlene Brooke, NP  Carbidopa-Levodopa ER (SINEMET CR) 25-100 MG tablet controlled release Take 1 tablet 4 (four) times daily by mouth. 09/02/17   [provider]  cephALEXin (KEFLEX) 250 MG/5ML suspension Take 10 mLs (500 mg total) by mouth 2 (two) times daily. 05/11/19   Brunetta Jeans, PA-C   cetirizine (ZYRTEC) 10 MG tablet Take 1 tablet (10 mg total) by mouth daily. 07/09/18   Midge Minium, MD  cholecalciferol (VITAMIN D) 1000 units tablet Take 2,000 Units by mouth daily. Last dose per husband on 02/26/2019    [provider]  collagenase (SANTYL) ointment Apply 1 application topically daily. 06/16/19   Brunetta Jeans, PA-C  donepezil (ARICEPT) 5 MG tablet Take 5 mg by mouth every evening.  09/20/17   [provider]  fluticasone (FLONASE) 50 MCG/ACT nasal spray SHAKE LIQUID AND USE 2 SPRAYS IN EACH NOSTRIL DAILY 01/29/19   Midge Minium, MD  levothyroxine (SYNTHROID) 75 MCG tablet TAKE 1 TABLET(75 MCG) BY MOUTH DAILY 05/14/19   Midge Minium, MD  mirtazapine (REMERON) 30 MG tablet Take 1 tablet (30 mg total) by mouth at bedtime. 11/21/17   Nche, Charlene Brooke, NP  Pimavanserin Tartrate (NUPLAZID) 34 MG CAPS Take 34 mg by mouth. Patient takes in the pm    [provider]    Family History No family history on file.  Social History Social History   Tobacco Use  . Smoking status: Former Smoker    Types: Cigarettes    Quit date: 01/01/2001    Years since quitting: 18.4  . Smokeless tobacco: Never Used  Substance Use Topics  . Alcohol use: No    Alcohol/week: 12.0 - 14.0 standard drinks    Types: 12 - 14 Glasses of wine per week    Comment:  occ glass of wine   . Drug use: No     Allergies   Codeine, Ether, Gabapentin, Iodine, Bactrim [sulfamethoxazole-trimethoprim], and Iodinated diagnostic agents   Review of Systems Review of Systems  Unable to perform ROS: Dementia  Constitutional: Negative for fever.  HENT: Negative for sore throat.   Respiratory: Negative for shortness of breath.   Cardiovascular: Negative for chest pain.  Gastrointestinal: Negative for abdominal pain.  Skin: Positive for wound.  Neurological: Negative for headaches.     Physical Exam Updated Vital Signs BP (!) 143/74 (BP Location: Left Arm)    Pulse 86   Temp 98.1 F (36.7 C) (Oral)   Resp 18   Ht 5\' 2"  (1.575 m)   Wt 59 kg   SpO2 97%   BMI 23.79 kg/m   Physical Exam Vitals signs and nursing note reviewed.  Constitutional:      General: She is not in acute distress.    Appearance: She is cachectic.  HENT:     Head: Normocephalic and atraumatic.  Eyes:     Conjunctiva/sclera: Conjunctivae normal.  Neck:     Musculoskeletal: Neck supple.  Cardiovascular:     Rate and Rhythm: Normal rate and regular rhythm.     Heart sounds: No murmur.  Pulmonary:     Effort: Pulmonary effort is normal. No respiratory distress.  Breath sounds: Normal breath sounds.  Abdominal:     Palpations: Abdomen is soft.     Tenderness: There is no abdominal tenderness.  Musculoskeletal:     Right lower leg: No edema.     Left lower leg: No edema.     Comments: She has some contractures of her lower extremities.  Skin:    General: Skin is warm and dry.     Capillary Refill: Capillary refill takes less than 2 seconds.     Comments: She has some reddening of her sacrum with approximately 2 cm ulcer that extends down into possible bone.  Neurological:     Mental Status: She is alert.     Comments: Patient is awake and alert.  She is able to move her upper extremities well but has limited use of her lower extremities.  She is not oriented to time or place.  She will answer some simple questions.        ED Treatments / Results  Labs (all labs ordered are listed, but only abnormal results are displayed) Labs Reviewed  COMPREHENSIVE METABOLIC PANEL - Abnormal; Notable for the following components:      Result Value   Potassium 2.9 (*)    Glucose, Bld 100 (*)    All other components within normal limits  SARS CORONAVIRUS 2  LACTIC ACID, PLASMA  CBC WITH DIFFERENTIAL/PLATELET    EKG None  Radiology No results found.  Procedures Procedures (including critical care time)  Medications Ordered in ED Medications  potassium  chloride SA (K-DUR) CR tablet 20 mEq (20 mEq Oral Given 06/26/19 1441)     Initial Impression / Assessment and Plan / ED Course  I have reviewed the triage vital signs and the nursing notes.  Pertinent labs & imaging results that were available during my care of the patient were reviewed by me and considered in my medical decision making (see chart for details).  Clinical Course as of Jun 26 754  Fri Jun 26, 2019  1404 Discussed with the patient's PCP Dr. Birdie Riddle who is the husband has been very inconsistent and his expectations.  He has the patient is a DNR and she is followed with palliative care but he wants everything done including a feeding tube for treatment of her wound.  Her recommendation is that the patient be evaluated by either palliative care or hospice and potentially would need to be admitted if he wants to proceed with getting a feeding tube.  She is also not sure who is adequately evaluated the wound and if she would benefit for some wound care   [MB]  1432 We reached out to the palliative care and they said they would be unable to send anybody over tonight but could see if she is inpatient.  I reviewed this with the hospitalist who said they will evaluate the patient but are not sure that she meets inpatient requirements.  I had a long discussion with the husband who said he is not able to care for her in the condition she is in and he is doing everything possible to help her wound care and her nutrition but it seems to be worsening despite his best efforts.   [MB]  6578 Discussed with Dr. Laren Everts from the hospitalist service who is not sure that the patient meets inpatient criteria but will evaluate and talk with the husband.   [MB]  1519 Dr. Laren Everts from the hospitalist service evaluated the patient and spoke at length with the  husband.  He does not feel the patient would benefit from an inpatient admission and does not feel a PEG tube would be in the patient's best interest.  The  husband is comfortable taking the patient home and we will try to get palliative care to arrange for home hospice.   [MB]    Clinical Course User Index [MB] Hayden Rasmussen, MD      Unfortunate 78 year old female with progressive Parkinson's in the setting of dementia with some failure to thrive and not eating possibly related to some difficulty transitioning food down.  She is able to drink.  She has been cared for by her husband.  She is having progressive wound breakdown of her sacrum likely due to her positioning and possibly related to some malnutrition.  Her husband feels like she would benefit from a feeding tube.   Final Clinical Impressions(s) / ED Diagnoses   Final diagnoses:  Pressure injury of sacral region, stage 4 (HCC)  Hypokalemia  FTT (failure to thrive) in adult  Parkinson's disease [facility])    ED Discharge Orders    None       Hayden Rasmussen, MD 06/27/19 [phone]

## 2019-06-26 NOTE — Care Management (Signed)
ED CM was consulted by Dr. Gustavus Messing concerning change of care plan.  Patient was evaluated for admission for wound care tx and it was decided that patient prognosis was not optimal and home hospice was discussed with patient and family.  CM reviewed record and noted patient was evaluted by [facility] Palliative team last week. CM met with patient and spouse at bedside to discuss recommendations for home hospice services. Spouse reports he has PCA to assist care for wife 6 days a week, and DME at home. CM explained Home hospice services and provided him with written information on [facility] services. Patient and husband are agreeable to receive home hospice services, CM contact [facility] with referral for hospice.  Spoke with on call nurse  E. Kipp Brood RN, referral accepted was accepted and start of care Sunday 8/16, CM updated patient and husband are both agreeable with plan.  CM also made patient and spouse aware that [facility] will be available if they should need assistance before the initial assessment is done. Patient and spouse verbalized understanding. Updated Dr. Sherry Ruffing on discharge plan.  No further ED CM needs  identified

## 2019-06-26 NOTE — ED Triage Notes (Signed)
Pt arrives with husband from home where she lives - per husband she has had decline in eating and over all health she has hx of dementia and parkinson's diease pt has a deep pressure ulcer and the husband reports he was told by the home health nurse she would need a feeding tube in order for this wound to continue to heal. Pt is alert to baseline per husband.

## 2019-06-26 NOTE — Discharge Instructions (Signed)
Gastrostomy Tube Home Guide, Adult °A gastrostomy tube, or G-tube, is a tube that is inserted through the abdomen into the stomach. The tube is used to give feedings and medicines when a person is unable to eat and drink enough on his or her own. °How to care for a G-tube °Supplies needed °· Saline solution or clean, warm water and soap. °· Cotton swab or gauze. °· Precut gauze bandage (dressing) and tape, if needed. °Instructions °1. Wash your hands with soap and water. °2. If there is a dressing between the person's skin and the tube, remove it. °3. Check the area where the tube enters the skin. Check for problems such as: °? Redness. °? Swelling. °? Pus-like drainage. °? Extra skin growth. °4. Moisten the cotton swab with the saline solution or soap and water mixture. Gently clean around the insertion site. Remove any drainage or crusting. °? When the G-tube is first put in, a normal saline solution or water can be used to clean the skin. °? Mild soap and warm water can be used when the skin around the G-tube site has healed. °5. If there should be a dressing between the person's skin and the tube, apply it at this time. °How to flush a G-tube °Flush the G-tube regularly to keep it from clogging. Flush it before and after feedings and as often as told by the health care provider. °Supplies needed °· Purified or sterile water, warmed. If the person has a weak disease-fighting (immune) system, or if he or she has difficulty fighting off infections (is immunocompromised), use only sterile water. °? If you are unsure about the amount of chemical contaminants in purified or drinking water, use sterile water. °? To purify drinking water by boiling: °§ Boil water for at least 1 minute. Keep lid over water while it boils. Allow water to cool to room temperature before using. °· 60cc G-tube syringe. °Instructions °1. Wash your hands with soap and water. °2. Draw up 30 mL of warm water in a syringe. °3. Connect the syringe  to the tube. °4. Slowly and gently push the water into the tube. °G-tube problems and solutions °· If the tube comes out: °? Cover the opening with a clean dressing and tape. °? Call a health care provider right away. °? A health care provider will need to put the tube back in within 4 hours. °· If there is skin or scar tissue growing where the tube enters the skin: °? Keep the area clean and dry. °? Secure the tube with tape so that the tube does not move around too much. °? Call a health care provider. °· If the tube gets clogged: °? Slowly push warm water into the tube with a large syringe. °? Do not force the fluid into the tube or push an object into the tube. °? If you are not able to unclog the tube, call a health care provider right away. °Follow these instructions at home: °Feedings °· Give feedings at room temperature. °· Cover and place unused feedings in the refrigerator. °· If feedings are continuous: °? Do not put more than 4 hours worth of feedings in the feeding bag. °? Stop the feedings when you need to give medicine or flush the tube. Be sure to restart the feedings. °? Make sure the person's head is above his or her stomach (upright position). This will prevent choking and discomfort. °· Replace feeding bags and syringes as told by the health care provider. °· Make   sure the person is in the right position during and after feedings: ? During feedings, the person's position should be in the upright position. ? After a noncontinuous feeding (bolus feeding), have the person stay in the upright position for 1 hour. General instructions  Only use syringes made for G-tubes.  Do not pull or put tension on the tube.  Clamp the tube before removing the cap or disconnecting a syringe.  Measure the length of the G-tube every day from the insertion site to the end of the tube.  If the person's G-tube has a balloon, check the fluid in the balloon every week. The amount of fluid that should be in  the balloon can be found in the manufacturers specifications.  Make sure the person takes care of his or her oral health, such as by brushing his or her teeth.  Remove excess air from the G-tube as told by the person's health care provider. This is called "venting."  Keep the area where the tube enters the skin clean and dry.  Do not push feedings, medicines, or flushes rapidly. Contact a health care provider if:  The person with the tube has any of these problems: ? Constipation. ? Fever.  There is a large amount of fluid or mucus-like liquid leaking from the tube.  Skin or scar tissue appears to be growing where the tube enters the skin.  The length of tube from the insertion site to the G-tube gets longer. Get help right away if:  The person with the tube has any of these problems: ? Severe abdominal pain. ? Severe tenderness. ? Severe bloating. ? Nausea. ? Vomiting. ? Trouble breathing. ? Shortness of breath.  Any of these problems happen in the area where the tube enters the skin: ? Redness, irritation, swelling, or soreness. ? Pus-like discharge. ? A bad smell.  The tube is clogged and cannot be flushed.  The tube comes out. Summary  A gastrostomy tube, or G-tube, is a tube that is inserted through the abdomen into the stomach. The tube is used to give feedings and medicines when a person is unable to eat and drink enough on his or her own.  Check and clean the insertion site daily as told by the person's health care provider.  Flush the G-tube regularly to keep it from clogging. Flush it before and after feedings and as often as told by the person's health care provider.  Keep the area where the tube enters the skin clean and dry. This information is not intended to replace advice given to you by your health care provider. Make sure you discuss any questions you have with your health care provider. Document Released: 01/07/2002 Document Revised: 10/11/2017  Document Reviewed: 12/24/2016 Elsevier Patient Education  2020 [employer].

## 2019-06-26 NOTE — Progress Notes (Signed)
CSW received consult for patient to assist with obtaining services for wound care. CSW notified Edwin Cap, RN CM of request and she will assist.  Madilyn Fireman, MSW, LCSW-A Clinical Social Worker [facility] [phone]

## 2019-06-26 NOTE — ED Notes (Signed)
Spouse  verbalized understanding of the discharge instructions.  Patient belongings were taken by the patient.

## 2019-06-26 NOTE — Consult Note (Signed)
[facility]                                                                                    Patient Demographics  Melissa Douglas, is a 78 y.o. female  CSN: [id number]  MRN: [id number]  DOB - 1941/08/01  Admit Date - 06/26/2019  Outpatient Primary MD for the patient is Midge Minium, MD   With History of -  Past Medical History:  Diagnosis Date  . Anxiety   . Cataract Right eye   HAD SURGERY  . Chronic joint pain   . Dementia ([place])   . Depression   . History of kidney stones   . Hx of adenomatous colonic polyps 08/18/2014  . Hypothyroidism   . Incontinence    Wears depends   . Multiple nasal polyps   . Osteopenia   . Parkinson disease ([place])   . Phlegm in throat    heavy phlegm in throat per husband, wet cough, has swallowing issues , no particular diet   . Pneumonia    ? aspirastion pneumonia       Past Surgical History:  Procedure Laterality Date  . ABDOMINAL HYSTERECTOMY     ovaries remain  . APPENDECTOMY    . CATARACT EXTRACTION    . EXTRACORPOREAL SHOCK WAVE LITHOTRIPSY Left 03/12/2019   Procedure: EXTRACORPOREAL SHOCK WAVE LITHOTRIPSY (ESWL);  Surgeon: Raynelle Bring, MD;  Location: [facility];  Service: Urology;  Laterality: Left;  . TONSILLECTOMY      in for   Chief Complaint  Patient presents with  . Wound Check  . Weight Loss     HPI  Melissa Douglas  is a 78 y.o. female, with past medical history significant for Parkinson's disease, advanced being followed by home health for sacral decubitus wound which has been getting worse and worsening nutrition.  Patient also has a history of advanced dementia and she is currently DNR .  Goals of care are confused at this time with multiple goals of care encounters in her old chart.  Husband wanted her admitted for placement of PEG tube. Dr. Melina Copa from the emergency room called her PCP and palliative care.  Palliative care is not available today for consult. Work-up in the emergency  room showed mild hypokalemia otherwise no other abnormalities.    Review of Systems    Unable to obtain due to patient's condition   Social History Social History   Tobacco Use  . Smoking status: Former Smoker    Types: Cigarettes    Quit date: 01/01/2001    Years since quitting: 18.4  . Smokeless tobacco: Never Used  Substance Use Topics  . Alcohol use: No    Alcohol/week: 12.0 - 14.0 standard drinks    Types: 12 - 14 Glasses of wine per week    Comment:  occ glass of wine      Family History No family history on file.   Prior to Admission medications   Medication Sig Start Date End Date Taking? Authorizing Provider  bisacodyl (DULCOLAX) 10 MG suppository Place 1 suppository (10 mg total) rectally daily as needed for moderate constipation. 01/13/19   Nche, Charlene Brooke, NP  Carbidopa-Levodopa ER (SINEMET CR) 25-100 MG tablet controlled release Take 1 tablet 4 (four) times daily by mouth. 09/02/17   [provider]  cephALEXin (KEFLEX) 250 MG/5ML suspension Take 10 mLs (500 mg total) by mouth 2 (two) times daily. 05/11/19   Brunetta Jeans, PA-C  cetirizine (ZYRTEC) 10 MG tablet Take 1 tablet (10 mg total) by mouth daily. 07/09/18   Midge Minium, MD  cholecalciferol (VITAMIN D) 1000 units tablet Take 2,000 Units by mouth daily. Last dose per husband on 02/26/2019    [provider]  collagenase (SANTYL) ointment Apply 1 application topically daily. 06/16/19   Brunetta Jeans, PA-C  donepezil (ARICEPT) 5 MG tablet Take 5 mg by mouth every evening.  09/20/17   [provider]  fluticasone (FLONASE) 50 MCG/ACT nasal spray SHAKE LIQUID AND USE 2 SPRAYS IN EACH NOSTRIL DAILY 01/29/19   Midge Minium, MD  levothyroxine (SYNTHROID) 75 MCG tablet TAKE 1 TABLET(75 MCG) BY MOUTH DAILY 05/14/19   Midge Minium, MD  mirtazapine (REMERON) 30 MG tablet Take 1 tablet (30 mg total) by mouth at bedtime. 11/21/17   Nche, Charlene Brooke, NP  Pimavanserin  Tartrate (NUPLAZID) 34 MG CAPS Take 34 mg by mouth. Patient takes in the pm    [provider]    Allergies  Allergen Reactions  . Codeine Nausea And Vomiting    REACTION: sick  . Ether Other (See Comments)    VERY DIZZY  . Gabapentin Nausea And Vomiting and Other (See Comments)    REACTION: hallucinations, dizziness and vomiting  . Iodine Hives    HIVES  . Bactrim [Sulfamethoxazole-Trimethoprim] Rash    Rash on shoulder and soft stools   . Iodinated Diagnostic Agents Rash    Physical Exam  Vitals  Blood pressure (!) 143/74, pulse 86, temperature 98.1 F (36.7 C), temperature source Oral, resp. rate 18, height 5\' 2"  (1.575 m), weight 59 kg, SpO2 97 %.   General appearance chronically ill, noncontributory, confused with advanced dementia HEENT no jaundice or pallor, no facial deviation or oral thrush Neck supple, no neck vein distention Chest good air entry bilaterally Heart normal S1-S2 no murmurs gallops or rubs Abdomen soft, nontender bowel sounds present Extremities no clubbing cyanosis or edema Sacrum, unstageable ulcer noted, Neuro nonfocal, patient moving all extremities but could not do further examination due to advanced dementia  Data Review  CBC Recent Labs  Lab 06/26/19 1047  WBC 6.4  HGB 12.5  HCT 40.2  PLT 325  MCV 84.1  MCH 26.2  MCHC 31.1  RDW 14.3  LYMPHSABS 1.1  MONOABS 0.6  EOSABS 0.1  BASOSABS 0.0   ------------------------------------------------------------------------------------------------------------------  Chemistries  Recent Labs  Lab 06/26/19 1047  NA 137  K 2.9*  CL 100  CO2 25  GLUCOSE 100*  BUN 10  CREATININE 0.68  CALCIUM 9.8  AST 15  ALT 6  ALKPHOS 55  BILITOT 0.8   ------------------------------------------------------------------------------------------------------------------ estimated creatinine clearance is 45.8 mL/min (by C-G formula based on SCr of 0.68  mg/dL). ------------------------------------------------------------------------------------------------------------------ No results for input(s): TSH, T4TOTAL, T3FREE, THYROIDAB in the last 72 hours.  Invalid input(s): FREET3   Coagulation profile No results for input(s): INR, PROTIME in the last 168 hours. ------------------------------------------------------------------------------------------------------------------- No results for input(s): DDIMER in the last 72 hours. -------------------------------------------------------------------------------------------------------------------  Cardiac Enzymes No results for input(s): CKMB, TROPONINI, MYOGLOBIN in the last 168 hours.  Invalid input(s): CK ------------------------------------------------------------------------------------------------------------------ Invalid input(s): POCBNP   ---------------------------------------------------------------------------------------------------------------  Urinalysis    Component Value  Date/Time   COLORURINE YELLOW 05/12/2019 1140   APPEARANCEUR Cloudy (A) 05/12/2019 1140   LABSPEC 1.025 05/12/2019 1140   PHURINE 6.5 05/12/2019 1140   GLUCOSEU NEGATIVE 05/12/2019 1140   HGBUR LARGE (A) 05/12/2019 1140   BILIRUBINUR SMALL (A) 05/12/2019 1140   BILIRUBINUR negative 02/05/2019 0917   KETONESUR 15 (A) 05/12/2019 1140   PROTEINUR Positive (A) 02/05/2019 0917   PROTEINUR 1+ (A) 01/13/2019 0915   UROBILINOGEN 0.2 05/12/2019 1140   NITRITE NEGATIVE 05/12/2019 1140   LEUKOCYTESUR NEGATIVE 05/12/2019 1140    ----------------------------------------------------------------------------------------------------------------  Imaging results:   No results found.    Assessment & Plan  This unfortunate 78 year old female with advanced Parkinson disease and  dementia presenting here with deteriorating nutrition and decubitus ulcer. Discussed with husband at length.  Patient is DNR and he  is convinced at this time that she should go for hospice. Gust with emergency room physician to arrange for transfer and probably palliative care consult at home. I personally think that placement of a PEG tube in this patient is kind of unethical due to her advanced medical condition and dementia     @SIGNATURE @

## 2019-06-26 NOTE — ED Provider Notes (Signed)
5:58 PM Care signed out to me from Dr. Melina Copa while awaiting evaluation and management by the palliative/hospice team.  Plan of care will be to discharge patient after they get a plan.  The palliative team was unable to be reached however the case management team was able to get in touch and they will send out a hospice representative this evening to the patient's home.  After this was determined, patient will be discharged for further home management with the hospice team.       Clinical Impression: 1. Pressure injury of sacral region, stage 4 ([place])   2. Hypokalemia   3. FTT (failure to thrive) in adult   4. Parkinson's disease ([place])     Disposition: Discharge  Condition: Good  I have discussed the results, Dx and Tx plan with the pt(& family if present). He/she/they expressed understanding and agree(s) with the plan. Discharge instructions discussed at great length. Strict return precautions discussed and pt &/or family have verbalized understanding of the instructions. No further questions at time of discharge.    New Prescriptions   No medications on file    Follow Up: [facility] [phone] Follow up [facility]) Nurse will contact you by phone tonight or tomorrow to come out establish services.     Tegeler, Gwenyth Allegra, MD 06/26/19 2325

## 2019-06-28 DIAGNOSIS — F418 Other specified anxiety disorders: Secondary | ICD-10-CM | POA: Diagnosis not present

## 2019-06-28 DIAGNOSIS — F0281 Dementia in other diseases classified elsewhere with behavioral disturbance: Secondary | ICD-10-CM | POA: Diagnosis not present

## 2019-06-28 DIAGNOSIS — E875 Hyperkalemia: Secondary | ICD-10-CM | POA: Diagnosis not present

## 2019-06-28 DIAGNOSIS — Z8701 Personal history of pneumonia (recurrent): Secondary | ICD-10-CM | POA: Diagnosis not present

## 2019-06-28 DIAGNOSIS — Z741 Need for assistance with personal care: Secondary | ICD-10-CM | POA: Diagnosis not present

## 2019-06-28 DIAGNOSIS — G20 Parkinson's disease: Secondary | ICD-10-CM | POA: Diagnosis not present

## 2019-06-28 DIAGNOSIS — M8580 Other specified disorders of bone density and structure, unspecified site: Secondary | ICD-10-CM | POA: Diagnosis not present

## 2019-06-28 DIAGNOSIS — E039 Hypothyroidism, unspecified: Secondary | ICD-10-CM | POA: Diagnosis not present

## 2019-06-28 DIAGNOSIS — R159 Full incontinence of feces: Secondary | ICD-10-CM | POA: Diagnosis not present

## 2019-06-28 DIAGNOSIS — J302 Other seasonal allergic rhinitis: Secondary | ICD-10-CM | POA: Diagnosis not present

## 2019-06-28 DIAGNOSIS — K219 Gastro-esophageal reflux disease without esophagitis: Secondary | ICD-10-CM | POA: Diagnosis not present

## 2019-06-28 DIAGNOSIS — L89154 Pressure ulcer of sacral region, stage 4: Secondary | ICD-10-CM | POA: Diagnosis not present

## 2019-06-28 DIAGNOSIS — Z7401 Bed confinement status: Secondary | ICD-10-CM | POA: Diagnosis not present

## 2019-06-28 DIAGNOSIS — R32 Unspecified urinary incontinence: Secondary | ICD-10-CM | POA: Diagnosis not present

## 2019-06-29 DIAGNOSIS — E875 Hyperkalemia: Secondary | ICD-10-CM | POA: Diagnosis not present

## 2019-06-29 DIAGNOSIS — Z8701 Personal history of pneumonia (recurrent): Secondary | ICD-10-CM | POA: Diagnosis not present

## 2019-06-29 DIAGNOSIS — G20 Parkinson's disease: Secondary | ICD-10-CM | POA: Diagnosis not present

## 2019-06-29 DIAGNOSIS — L89154 Pressure ulcer of sacral region, stage 4: Secondary | ICD-10-CM | POA: Diagnosis not present

## 2019-06-29 DIAGNOSIS — F418 Other specified anxiety disorders: Secondary | ICD-10-CM | POA: Diagnosis not present

## 2019-06-29 DIAGNOSIS — F0281 Dementia in other diseases classified elsewhere with behavioral disturbance: Secondary | ICD-10-CM | POA: Diagnosis not present

## 2019-06-30 DIAGNOSIS — F418 Other specified anxiety disorders: Secondary | ICD-10-CM | POA: Diagnosis not present

## 2019-06-30 DIAGNOSIS — G20 Parkinson's disease: Secondary | ICD-10-CM | POA: Diagnosis not present

## 2019-06-30 DIAGNOSIS — Z8701 Personal history of pneumonia (recurrent): Secondary | ICD-10-CM | POA: Diagnosis not present

## 2019-06-30 DIAGNOSIS — F0281 Dementia in other diseases classified elsewhere with behavioral disturbance: Secondary | ICD-10-CM | POA: Diagnosis not present

## 2019-06-30 DIAGNOSIS — E875 Hyperkalemia: Secondary | ICD-10-CM | POA: Diagnosis not present

## 2019-06-30 DIAGNOSIS — L89154 Pressure ulcer of sacral region, stage 4: Secondary | ICD-10-CM | POA: Diagnosis not present

## 2019-07-01 ENCOUNTER — Other Ambulatory Visit: Payer: Self-pay | Admitting: Family Medicine

## 2019-07-01 DIAGNOSIS — G20 Parkinson's disease: Secondary | ICD-10-CM | POA: Diagnosis not present

## 2019-07-01 DIAGNOSIS — L89154 Pressure ulcer of sacral region, stage 4: Secondary | ICD-10-CM | POA: Diagnosis not present

## 2019-07-01 DIAGNOSIS — E875 Hyperkalemia: Secondary | ICD-10-CM | POA: Diagnosis not present

## 2019-07-01 DIAGNOSIS — Z8701 Personal history of pneumonia (recurrent): Secondary | ICD-10-CM | POA: Diagnosis not present

## 2019-07-01 DIAGNOSIS — F0281 Dementia in other diseases classified elsewhere with behavioral disturbance: Secondary | ICD-10-CM | POA: Diagnosis not present

## 2019-07-01 DIAGNOSIS — F418 Other specified anxiety disorders: Secondary | ICD-10-CM | POA: Diagnosis not present

## 2019-07-02 DIAGNOSIS — Z8701 Personal history of pneumonia (recurrent): Secondary | ICD-10-CM | POA: Diagnosis not present

## 2019-07-02 DIAGNOSIS — F0281 Dementia in other diseases classified elsewhere with behavioral disturbance: Secondary | ICD-10-CM | POA: Diagnosis not present

## 2019-07-02 DIAGNOSIS — G20 Parkinson's disease: Secondary | ICD-10-CM | POA: Diagnosis not present

## 2019-07-02 DIAGNOSIS — E875 Hyperkalemia: Secondary | ICD-10-CM | POA: Diagnosis not present

## 2019-07-02 DIAGNOSIS — F418 Other specified anxiety disorders: Secondary | ICD-10-CM | POA: Diagnosis not present

## 2019-07-02 DIAGNOSIS — L89154 Pressure ulcer of sacral region, stage 4: Secondary | ICD-10-CM | POA: Diagnosis not present

## 2019-07-03 DIAGNOSIS — G20 Parkinson's disease: Secondary | ICD-10-CM | POA: Diagnosis not present

## 2019-07-03 DIAGNOSIS — L89154 Pressure ulcer of sacral region, stage 4: Secondary | ICD-10-CM | POA: Diagnosis not present

## 2019-07-03 DIAGNOSIS — F0281 Dementia in other diseases classified elsewhere with behavioral disturbance: Secondary | ICD-10-CM | POA: Diagnosis not present

## 2019-07-03 DIAGNOSIS — F418 Other specified anxiety disorders: Secondary | ICD-10-CM | POA: Diagnosis not present

## 2019-07-03 DIAGNOSIS — Z8701 Personal history of pneumonia (recurrent): Secondary | ICD-10-CM | POA: Diagnosis not present

## 2019-07-03 DIAGNOSIS — E875 Hyperkalemia: Secondary | ICD-10-CM | POA: Diagnosis not present

## 2019-07-05 DIAGNOSIS — F0281 Dementia in other diseases classified elsewhere with behavioral disturbance: Secondary | ICD-10-CM | POA: Diagnosis not present

## 2019-07-05 DIAGNOSIS — L89154 Pressure ulcer of sacral region, stage 4: Secondary | ICD-10-CM | POA: Diagnosis not present

## 2019-07-05 DIAGNOSIS — G20 Parkinson's disease: Secondary | ICD-10-CM | POA: Diagnosis not present

## 2019-07-05 DIAGNOSIS — F418 Other specified anxiety disorders: Secondary | ICD-10-CM | POA: Diagnosis not present

## 2019-07-05 DIAGNOSIS — Z8701 Personal history of pneumonia (recurrent): Secondary | ICD-10-CM | POA: Diagnosis not present

## 2019-07-05 DIAGNOSIS — E875 Hyperkalemia: Secondary | ICD-10-CM | POA: Diagnosis not present

## 2019-07-06 ENCOUNTER — Encounter: Payer: Self-pay | Admitting: Family Medicine

## 2019-07-06 DIAGNOSIS — L89154 Pressure ulcer of sacral region, stage 4: Secondary | ICD-10-CM | POA: Diagnosis not present

## 2019-07-06 DIAGNOSIS — Z8701 Personal history of pneumonia (recurrent): Secondary | ICD-10-CM | POA: Diagnosis not present

## 2019-07-06 DIAGNOSIS — F418 Other specified anxiety disorders: Secondary | ICD-10-CM | POA: Diagnosis not present

## 2019-07-06 DIAGNOSIS — G20 Parkinson's disease: Secondary | ICD-10-CM | POA: Diagnosis not present

## 2019-07-06 DIAGNOSIS — F0281 Dementia in other diseases classified elsewhere with behavioral disturbance: Secondary | ICD-10-CM | POA: Diagnosis not present

## 2019-07-06 DIAGNOSIS — E875 Hyperkalemia: Secondary | ICD-10-CM | POA: Diagnosis not present

## 2019-07-14 DEATH — deceased
